# Patient Record
Sex: Male | Born: 1945 | Race: White | Hispanic: No | Smoking: Former smoker
Health system: Southern US, Community
[De-identification: ages and names within clinical notes are randomized; demographics above are authoritative.]

## PROBLEM LIST (undated history)

## (undated) DIAGNOSIS — I779 Disorder of arteries and arterioles, unspecified: Secondary | ICD-10-CM

## (undated) DIAGNOSIS — I10 Essential (primary) hypertension: Secondary | ICD-10-CM

## (undated) DIAGNOSIS — I214 Non-ST elevation (NSTEMI) myocardial infarction: Secondary | ICD-10-CM

## (undated) DIAGNOSIS — J449 Chronic obstructive pulmonary disease, unspecified: Secondary | ICD-10-CM

## (undated) DIAGNOSIS — E785 Hyperlipidemia, unspecified: Secondary | ICD-10-CM

## (undated) DIAGNOSIS — Z9981 Dependence on supplemental oxygen: Secondary | ICD-10-CM

## (undated) DIAGNOSIS — J45909 Unspecified asthma, uncomplicated: Secondary | ICD-10-CM

## (undated) DIAGNOSIS — I2510 Atherosclerotic heart disease of native coronary artery without angina pectoris: Secondary | ICD-10-CM

## (undated) DIAGNOSIS — I739 Peripheral vascular disease, unspecified: Secondary | ICD-10-CM

## (undated) DIAGNOSIS — Z720 Tobacco use: Secondary | ICD-10-CM

## (undated) HISTORY — DX: Non-ST elevation (NSTEMI) myocardial infarction: I21.4

## (undated) HISTORY — DX: Essential (primary) hypertension: I10

## (undated) HISTORY — PX: OTHER SURGICAL HISTORY: SHX169

## (undated) HISTORY — DX: Disorder of arteries and arterioles, unspecified: I77.9

## (undated) HISTORY — DX: Peripheral vascular disease, unspecified: I73.9

## (undated) HISTORY — DX: Chronic obstructive pulmonary disease, unspecified: J44.9

## (undated) HISTORY — PX: ABDOMINAL SURGERY: SHX537

## (undated) HISTORY — PX: COLONOSCOPY: SHX174

## (undated) HISTORY — DX: Tobacco use: Z72.0

## (undated) HISTORY — DX: Hyperlipidemia, unspecified: E78.5

## (undated) HISTORY — DX: Atherosclerotic heart disease of native coronary artery without angina pectoris: I25.10

---

## 2003-07-11 HISTORY — PX: SIGMOID RESECTION / RECTOPEXY: SUR1294

## 2010-09-06 ENCOUNTER — Encounter (INDEPENDENT_AMBULATORY_CARE_PROVIDER_SITE_OTHER): Payer: Medicare HMO | Admitting: Vascular Surgery

## 2010-09-06 DIAGNOSIS — I70219 Atherosclerosis of native arteries of extremities with intermittent claudication, unspecified extremity: Secondary | ICD-10-CM

## 2010-09-07 NOTE — Consult Note (Signed)
NEW PATIENT CONSULTATION  EASTIN, SWING DOB:  1945-09-16                                       09/06/2010 [id number]#:[id number]  The patient is a 65 year old male patient with severe lower extremity occlusive disease referred for rest ischemia and severe claudication symptoms which he has experienced for the past few years.  He states these symptoms have been worsening over the last few months.  He has pain and numbness in his feet at rest to some degree and has severe claudication which prevents him from walking any distance.  This involves his entire legs.  He has no history of infection or cellulitis or non-healing ulcers  CHRONIC MEDICAL PROBLEMS: 1. Hypertension. 2. COPD with chronic bronchitis and emphysema. 3. A history of tobacco abuse. 4. History of small bowel obstruction with bowel resection and hernia     repair. 5. Negative for coronary artery disease, diabetes, hyperlipidemia or     stroke.  SOCIAL HISTORY:  He is single, has 2 children, is retired.  He smoked 1 to 2 packs cigarettes for 50 years.  Does not use alcohol.  Quit 10 years ago.  FAMILY HISTORY:  Negative coronary disease, diabetes and stroke.  REVIEW OF SYSTEMS:  Positive for productive cough, wheezing, joint pain, muscle pain particularly in his hips with walking, dyspnea on exertion. Denies chest pain.  Has a hiatal hernia, constipation.  All other systems are negative in complete review of systems.  PHYSICAL EXAMINATION:  Blood pressure 186/81, heart rate is 96, respirations 20.  General:  He is a well-developed, well-nourished male in no apparent distress, alert and oriented x3.  HEENT:  Normal for age. EOM is intact.  Lungs revealed diffuse rash or wheezing bilaterally with bilateral rhonchi.  Cardiovascular:  Regular rhythm.  No murmurs. Carotid pulses are 3+.  No audible bruits.  Abdomen reveals a longitudinal and transverse incision through the umbilical area with a diffuse  upper abdominal ventral hernia.  No masses are palpable. Musculoskeletal:  Free of major deformities.  Neurologic:  Decreased sensation in both feet.  Skin exam reveals dependent rubor in both legs from the midcalf distally with no evidence of cellulitis.  Lower extremity exam reveals absent femoral and distal pulses.  I reviewed the records provided by Dr. Sherryll Burger, including an ultrasound study performed on February 20 of this year by [facility] revealing an ABI of 0.26 on the right, 0.24 on the left, with multilevel disease.  I think this patient has probably an aortic or bilateral iliac occlusions with superficial femoral and/or tibial disease as well with severe symptoms.  I have ordered the following: 1. A CT angiogram to be performed in the next few days to look at his     aorta and runoff. 2. A Cardiolite to evaluate his cardiac status. 3. Return to see me in 1 week for reconstructive surgery, probable     right axillobifemoral bypass to be done on Wednesday March 7.  If     it appears that he has something amenable to PTA and stenting after     CT angiograms, then we will go that route.    Quita Skye Hart Rochester, M.D. Electronically Signed  JDL/MEDQ  D:  09/06/2010  T:  09/07/2010  Job:  [id number]  cc:   Kirstie Peri, MD

## 2010-09-08 ENCOUNTER — Other Ambulatory Visit: Payer: Self-pay | Admitting: Vascular Surgery

## 2010-09-08 DIAGNOSIS — I739 Peripheral vascular disease, unspecified: Secondary | ICD-10-CM

## 2010-09-08 HISTORY — PX: OTHER SURGICAL HISTORY: SHX169

## 2010-09-13 ENCOUNTER — Ambulatory Visit (INDEPENDENT_AMBULATORY_CARE_PROVIDER_SITE_OTHER): Payer: Medicare HMO | Admitting: Vascular Surgery

## 2010-09-13 ENCOUNTER — Ambulatory Visit
Admission: RE | Admit: 2010-09-13 | Discharge: 2010-09-13 | Disposition: A | Discharge status: Home / Self Care | Payer: Medicare HMO | Source: Ambulatory Visit | Attending: Vascular Surgery | Admitting: Vascular Surgery

## 2010-09-13 DIAGNOSIS — I739 Peripheral vascular disease, unspecified: Secondary | ICD-10-CM

## 2010-09-13 DIAGNOSIS — I70219 Atherosclerosis of native arteries of extremities with intermittent claudication, unspecified extremity: Secondary | ICD-10-CM

## 2010-09-13 MED ORDER — IOHEXOL 350 MG/ML SOLN
165.0000 mL | Freq: Once | INTRAVENOUS | Status: AC | PRN
  Administered 2010-09-13: 165 mL via INTRAVENOUS

## 2010-09-14 ENCOUNTER — Inpatient Hospital Stay (HOSPITAL_COMMUNITY)
Admission: RE | Admit: 2010-09-14 | Discharge: 2010-09-24 | DRG: 252 | Disposition: A | Discharge status: Home / Self Care | Payer: Medicare HMO | Source: Ambulatory Visit | Attending: Vascular Surgery | Admitting: Vascular Surgery

## 2010-09-14 ENCOUNTER — Inpatient Hospital Stay (HOSPITAL_COMMUNITY): Payer: Medicare HMO

## 2010-09-14 DIAGNOSIS — J209 Acute bronchitis, unspecified: Secondary | ICD-10-CM | POA: Diagnosis not present

## 2010-09-14 DIAGNOSIS — F17200 Nicotine dependence, unspecified, uncomplicated: Secondary | ICD-10-CM | POA: Diagnosis present

## 2010-09-14 DIAGNOSIS — E872 Acidosis, unspecified: Secondary | ICD-10-CM | POA: Diagnosis not present

## 2010-09-14 DIAGNOSIS — N179 Acute kidney failure, unspecified: Secondary | ICD-10-CM | POA: Diagnosis not present

## 2010-09-14 DIAGNOSIS — R404 Transient alteration of awareness: Secondary | ICD-10-CM | POA: Diagnosis not present

## 2010-09-14 DIAGNOSIS — J440 Chronic obstructive pulmonary disease with acute lower respiratory infection: Secondary | ICD-10-CM | POA: Diagnosis not present

## 2010-09-14 DIAGNOSIS — I7092 Chronic total occlusion of artery of the extremities: Secondary | ICD-10-CM | POA: Diagnosis present

## 2010-09-14 DIAGNOSIS — K560 Paralytic ileus: Secondary | ICD-10-CM | POA: Diagnosis not present

## 2010-09-14 DIAGNOSIS — I959 Hypotension, unspecified: Secondary | ICD-10-CM | POA: Diagnosis not present

## 2010-09-14 DIAGNOSIS — L02219 Cutaneous abscess of trunk, unspecified: Secondary | ICD-10-CM | POA: Diagnosis not present

## 2010-09-14 DIAGNOSIS — K56609 Unspecified intestinal obstruction, unspecified as to partial versus complete obstruction: Secondary | ICD-10-CM | POA: Diagnosis not present

## 2010-09-14 DIAGNOSIS — J9600 Acute respiratory failure, unspecified whether with hypoxia or hypercapnia: Secondary | ICD-10-CM | POA: Diagnosis not present

## 2010-09-14 DIAGNOSIS — J9819 Other pulmonary collapse: Secondary | ICD-10-CM | POA: Diagnosis not present

## 2010-09-14 DIAGNOSIS — I10 Essential (primary) hypertension: Secondary | ICD-10-CM | POA: Diagnosis present

## 2010-09-14 DIAGNOSIS — I70229 Atherosclerosis of native arteries of extremities with rest pain, unspecified extremity: Principal | ICD-10-CM | POA: Diagnosis present

## 2010-09-14 LAB — URINALYSIS, ROUTINE W REFLEX MICROSCOPIC
Hgb urine dipstick: NEGATIVE
Specific Gravity, Urine: 1.037 — ABNORMAL HIGH (ref 1.005–1.030)
Urobilinogen, UA: 0.2 mg/dL (ref 0.0–1.0)
pH: 6.5 (ref 5.0–8.0)

## 2010-09-14 LAB — COMPREHENSIVE METABOLIC PANEL
BUN: 23 mg/dL (ref 6–23)
CO2: 29 mEq/L (ref 19–32)
Calcium: 8.9 mg/dL (ref 8.4–10.5)
Creatinine, Ser: 1.11 mg/dL (ref 0.4–1.5)
GFR calc Af Amer: >60 mL/min (ref >60)
GFR calc non Af Amer: >60 mL/min (ref >60)
Glucose, Bld: 114 mg/dL — ABNORMAL HIGH (ref 70–99)
Total Bilirubin: 0.5 mg/dL (ref 0.3–1.2)

## 2010-09-14 LAB — CBC
HCT: 41.6 % (ref 39.0–52.0)
Hemoglobin: 14.4 g/dL (ref 13.0–17.0)
RBC: 4.4 MIL/uL (ref 4.22–5.81)
RDW: 15 % (ref 11.5–15.5)
WBC: 15.7 10*3/uL — ABNORMAL HIGH (ref 4.0–10.5)

## 2010-09-14 LAB — SURGICAL PCR SCREEN
MRSA, PCR: NEGATIVE
Staphylococcus aureus: NEGATIVE

## 2010-09-14 LAB — PROTIME-INR: Prothrombin Time: 14.7 seconds (ref 11.6–15.2)

## 2010-09-14 NOTE — Assessment & Plan Note (Signed)
OFFICE VISIT  MARCOS, RUELAS DOB:  [birth year]                                       09/13/2010 EAVWU#:[id number]  Patient returned today, having had a CT angiogram of his abdominal aorta and bilateral runoff performed at [facility] today.  I have reviewed the study via computer.  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  It appears that his superficial femoral arteries are diseased but patent bilaterally, and he does have 3-vessel runoff.  He continues to experience rest pain with numbness in his feet and has a superficial ulcer in the pretibial area of the right leg.  CHRONIC MEDICAL PROBLEMS: 1. Hypertension. 2. COPD with chronic bronchitis and emphysema. 3. History of chronic tobacco abuse. 4. Negative for coronary artery disease, diabetes, hyperlipidemia, or     stroke.  He did have cardiac clearance by Dr. Lynnea Ferrier following a Cardiolite study, which revealed no evidence of ischemia and an ejection fraction of 66%.  PHYSICAL EXAMINATION:  On exam today, his blood pressure is equal in both upper extremities at 142/76, heart rate is 80, respirations 14.  He has 3+ brachial and radial pulses bilaterally with absent femoral pulses and chronically ischemic legs.  Will plan a right axillofemoral and right-to-left femoral-femoral bypass graft to be performed tomorrow at [facility].  The risks and benefits have been thoroughly discussed with the patient and also impressed upon him and emphasized the need to discontinue smoking because of his severe COPD and atherosclerosis.    Quita Skye Hart Rochester, M.D. Electronically Signed  JDL/MEDQ  D:  09/13/2010  T:  09/14/2010  Job:  [id number]

## 2010-09-15 LAB — BASIC METABOLIC PANEL
Calcium: 8.1 mg/dL — ABNORMAL LOW (ref 8.4–10.5)
GFR calc Af Amer: >60 mL/min (ref >60)
GFR calc non Af Amer: >60 mL/min (ref >60)
Glucose, Bld: 138 mg/dL — ABNORMAL HIGH (ref 70–99)
Potassium: 3.2 mEq/L — ABNORMAL LOW (ref 3.5–5.1)
Sodium: 134 mEq/L — ABNORMAL LOW (ref 135–145)

## 2010-09-15 LAB — EXPECTORATED SPUTUM ASSESSMENT W GRAM STAIN, RFLX TO RESP C

## 2010-09-15 LAB — CBC
HCT: 37.2 % — ABNORMAL LOW (ref 39.0–52.0)
Hemoglobin: 12.6 g/dL — ABNORMAL LOW (ref 13.0–17.0)
MCHC: 33.9 g/dL (ref 30.0–36.0)
RDW: 14.6 % (ref 11.5–15.5)
WBC: 18.6 10*3/uL — ABNORMAL HIGH (ref 4.0–10.5)

## 2010-09-16 ENCOUNTER — Inpatient Hospital Stay (HOSPITAL_COMMUNITY): Payer: Medicare HMO

## 2010-09-16 DIAGNOSIS — I739 Peripheral vascular disease, unspecified: Secondary | ICD-10-CM

## 2010-09-16 LAB — CBC
Hemoglobin: 11.6 g/dL — ABNORMAL LOW (ref 13.0–17.0)
MCH: 31.7 pg (ref 26.0–34.0)
MCHC: 34 g/dL (ref 30.0–36.0)
Platelets: 198 10*3/uL (ref 150–400)
RDW: 14.6 % (ref 11.5–15.5)

## 2010-09-16 LAB — BASIC METABOLIC PANEL
Calcium: 7.8 mg/dL — ABNORMAL LOW (ref 8.4–10.5)
Creatinine, Ser: 1.65 mg/dL — ABNORMAL HIGH (ref 0.4–1.5)
GFR calc Af Amer: 51 mL/min — ABNORMAL LOW (ref >60)
GFR calc non Af Amer: 42 mL/min — ABNORMAL LOW (ref >60)
Sodium: 130 mEq/L — ABNORMAL LOW (ref 135–145)

## 2010-09-16 LAB — CREATININE, URINE, RANDOM: Creatinine, Urine: 241.1 mg/dL

## 2010-09-17 ENCOUNTER — Inpatient Hospital Stay (HOSPITAL_COMMUNITY): Payer: Medicare HMO

## 2010-09-17 DIAGNOSIS — J9600 Acute respiratory failure, unspecified whether with hypoxia or hypercapnia: Secondary | ICD-10-CM

## 2010-09-17 DIAGNOSIS — N179 Acute kidney failure, unspecified: Secondary | ICD-10-CM

## 2010-09-17 DIAGNOSIS — R579 Shock, unspecified: Secondary | ICD-10-CM

## 2010-09-17 LAB — BASIC METABOLIC PANEL
BUN: 31 mg/dL — ABNORMAL HIGH (ref 6–23)
CO2: 22 mEq/L (ref 19–32)
Calcium: 8 mg/dL — ABNORMAL LOW (ref 8.4–10.5)
Glucose, Bld: 238 mg/dL — ABNORMAL HIGH (ref 70–99)
Potassium: 4.2 mEq/L (ref 3.5–5.1)
Sodium: 123 mEq/L — ABNORMAL LOW (ref 135–145)

## 2010-09-17 LAB — POCT I-STAT, CHEM 8
Chloride: 100 mEq/L (ref 96–112)
HCT: 34 % — ABNORMAL LOW (ref 39.0–52.0)
Hemoglobin: 11.6 g/dL — ABNORMAL LOW (ref 13.0–17.0)
Potassium: 4.1 mEq/L (ref 3.5–5.1)
Sodium: 127 mEq/L — ABNORMAL LOW (ref 135–145)

## 2010-09-17 LAB — CBC
HCT: 34.2 % — ABNORMAL LOW (ref 39.0–52.0)
HCT: 33 % — ABNORMAL LOW (ref 39.0–52.0)
Hemoglobin: 11.9 g/dL — ABNORMAL LOW (ref 13.0–17.0)
Hemoglobin: 11.6 g/dL — ABNORMAL LOW (ref 13.0–17.0)
MCH: 32.1 pg (ref 26.0–34.0)
MCHC: 34.8 g/dL (ref 30.0–36.0)
MCHC: 35.2 g/dL (ref 30.0–36.0)
MCV: 91.9 fL (ref 78.0–100.0)
RDW: 14.1 % (ref 11.5–15.5)

## 2010-09-17 LAB — CARDIAC PANEL(CRET KIN+CKTOT+MB+TROPI)
CK, MB: 7.1 ng/mL (ref 0.3–4.0)
Relative Index: 1.5 (ref 0.0–2.5)
Total CK: 734 U/L — ABNORMAL HIGH (ref 7–232)
Total CK: 429 U/L — ABNORMAL HIGH (ref 7–232)
Troponin I: 0.03 ng/mL (ref 0.00–0.06)
Troponin I: 0.03 ng/mL (ref 0.00–0.06)

## 2010-09-17 LAB — POCT I-STAT 3, ART BLOOD GAS (G3+)
pCO2 arterial: 37.3 mmHg (ref 35.0–45.0)
pO2, Arterial: 54 mmHg — ABNORMAL LOW (ref 80.0–100.0)

## 2010-09-17 LAB — CULTURE, RESPIRATORY W GRAM STAIN: Gram Stain: NONE SEEN

## 2010-09-17 LAB — DIFFERENTIAL
Basophils Absolute: 0 10*3/uL (ref 0.0–0.1)
Eosinophils Relative: 0 % (ref 0–5)
Lymphocytes Relative: 5 % — ABNORMAL LOW (ref 12–46)
Lymphs Abs: 0.9 10*3/uL (ref 0.7–4.0)
Monocytes Absolute: 1.2 10*3/uL — ABNORMAL HIGH (ref 0.1–1.0)
Monocytes Relative: 7 % (ref 3–12)
Neutro Abs: 15.4 10*3/uL — ABNORMAL HIGH (ref 1.7–7.7)

## 2010-09-18 ENCOUNTER — Inpatient Hospital Stay (HOSPITAL_COMMUNITY): Payer: Medicare HMO

## 2010-09-18 DIAGNOSIS — K560 Paralytic ileus: Secondary | ICD-10-CM

## 2010-09-18 DIAGNOSIS — J441 Chronic obstructive pulmonary disease with (acute) exacerbation: Secondary | ICD-10-CM

## 2010-09-18 DIAGNOSIS — I959 Hypotension, unspecified: Secondary | ICD-10-CM

## 2010-09-18 DIAGNOSIS — N179 Acute kidney failure, unspecified: Secondary | ICD-10-CM

## 2010-09-18 LAB — TYPE AND SCREEN
ABO/RH(D): A POS
Antibody Screen: POSITIVE
DAT, IgG: NEGATIVE

## 2010-09-18 LAB — CBC
MCH: 32.8 pg (ref 26.0–34.0)
MCV: 93.3 fL (ref 78.0–100.0)
Platelets: 261 10*3/uL (ref 150–400)
RDW: 14.5 % (ref 11.5–15.5)

## 2010-09-18 LAB — BASIC METABOLIC PANEL
BUN: 25 mg/dL — ABNORMAL HIGH (ref 6–23)
Calcium: 8.4 mg/dL (ref 8.4–10.5)
Creatinine, Ser: 1.03 mg/dL (ref 0.4–1.5)
GFR calc Af Amer: >60 mL/min (ref >60)
GFR calc non Af Amer: >60 mL/min (ref >60)

## 2010-09-18 LAB — CLOSTRIDIUM DIFFICILE BY PCR: Toxigenic C. Difficile by PCR: NEGATIVE

## 2010-09-18 LAB — GLUCOSE, CAPILLARY: Glucose-Capillary: 169 mg/dL — ABNORMAL HIGH (ref 70–99)

## 2010-09-19 ENCOUNTER — Inpatient Hospital Stay (HOSPITAL_COMMUNITY): Payer: Medicare HMO

## 2010-09-19 DIAGNOSIS — R4182 Altered mental status, unspecified: Secondary | ICD-10-CM

## 2010-09-19 LAB — BASIC METABOLIC PANEL
Calcium: 8.1 mg/dL — ABNORMAL LOW (ref 8.4–10.5)
Calcium: 7.9 mg/dL — ABNORMAL LOW (ref 8.4–10.5)
GFR calc Af Amer: >60 mL/min (ref >60)
GFR calc non Af Amer: >60 mL/min (ref >60)
GFR calc non Af Amer: >60 mL/min (ref >60)
Glucose, Bld: 106 mg/dL — ABNORMAL HIGH (ref 70–99)
Glucose, Bld: 92 mg/dL (ref 70–99)
Sodium: 143 mEq/L (ref 135–145)
Sodium: 145 mEq/L (ref 135–145)

## 2010-09-19 LAB — CBC
HCT: 36.4 % — ABNORMAL LOW (ref 39.0–52.0)
MCHC: 34.9 g/dL (ref 30.0–36.0)
Platelets: 355 10*3/uL (ref 150–400)
RDW: 14.8 % (ref 11.5–15.5)

## 2010-09-19 LAB — MAGNESIUM: Magnesium: 2.6 mg/dL — ABNORMAL HIGH (ref 1.5–2.5)

## 2010-09-20 ENCOUNTER — Inpatient Hospital Stay (HOSPITAL_COMMUNITY): Payer: Medicare HMO

## 2010-09-20 LAB — BASIC METABOLIC PANEL
CO2: 23 mEq/L (ref 19–32)
Calcium: 8.1 mg/dL — ABNORMAL LOW (ref 8.4–10.5)
GFR calc Af Amer: >60 mL/min (ref >60)
GFR calc non Af Amer: >60 mL/min (ref >60)
Sodium: 145 mEq/L (ref 135–145)

## 2010-09-20 LAB — CBC
Hemoglobin: 11.2 g/dL — ABNORMAL LOW (ref 13.0–17.0)
MCHC: 33.5 g/dL (ref 30.0–36.0)
RDW: 14.5 % (ref 11.5–15.5)

## 2010-09-21 ENCOUNTER — Inpatient Hospital Stay (HOSPITAL_COMMUNITY): Payer: Medicare HMO

## 2010-09-21 LAB — BASIC METABOLIC PANEL
CO2: 28 mEq/L (ref 19–32)
CO2: 30 mEq/L (ref 19–32)
Chloride: 109 mEq/L (ref 96–112)
GFR calc Af Amer: >60 mL/min (ref >60)
GFR calc non Af Amer: >60 mL/min (ref >60)
Glucose, Bld: 135 mg/dL — ABNORMAL HIGH (ref 70–99)
Potassium: 2.8 mEq/L — ABNORMAL LOW (ref 3.5–5.1)
Potassium: 3.4 mEq/L — ABNORMAL LOW (ref 3.5–5.1)
Sodium: 145 mEq/L (ref 135–145)
Sodium: 142 mEq/L (ref 135–145)

## 2010-09-21 LAB — CBC
Hemoglobin: 11.4 g/dL — ABNORMAL LOW (ref 13.0–17.0)
RBC: 3.68 MIL/uL — ABNORMAL LOW (ref 4.22–5.81)

## 2010-09-22 LAB — COMPREHENSIVE METABOLIC PANEL
AST: 39 U/L — ABNORMAL HIGH (ref 0–37)
Albumin: 2.5 g/dL — ABNORMAL LOW (ref 3.5–5.2)
BUN: 7 mg/dL (ref 6–23)
Chloride: 106 mEq/L (ref 96–112)
Creatinine, Ser: 0.58 mg/dL (ref 0.4–1.5)
GFR calc Af Amer: >60 mL/min (ref >60)
Potassium: 3.4 mEq/L — ABNORMAL LOW (ref 3.5–5.1)
Total Bilirubin: 0.7 mg/dL (ref 0.3–1.2)
Total Protein: 5.2 g/dL — ABNORMAL LOW (ref 6.0–8.3)

## 2010-09-22 LAB — GLUCOSE, CAPILLARY
Glucose-Capillary: 147 mg/dL — ABNORMAL HIGH (ref 70–99)
Glucose-Capillary: 108 mg/dL — ABNORMAL HIGH (ref 70–99)

## 2010-09-22 LAB — CHOLESTEROL, TOTAL: Cholesterol: 84 mg/dL (ref 0–200)

## 2010-09-22 LAB — DIFFERENTIAL
Basophils Absolute: 0.1 10*3/uL (ref 0.0–0.1)
Basophils Relative: 0 % (ref 0–1)
Eosinophils Absolute: 0.2 10*3/uL (ref 0.0–0.7)
Neutro Abs: 8.2 10*3/uL — ABNORMAL HIGH (ref 1.7–7.7)
Neutrophils Relative %: 72 % (ref 43–77)

## 2010-09-22 LAB — CBC
Hemoglobin: 11.8 g/dL — ABNORMAL LOW (ref 13.0–17.0)
MCHC: 34.5 g/dL (ref 30.0–36.0)
Platelets: 267 10*3/uL (ref 150–400)
RBC: 3.64 MIL/uL — ABNORMAL LOW (ref 4.22–5.81)

## 2010-09-22 LAB — PREALBUMIN: Prealbumin: 7 mg/dL — ABNORMAL LOW (ref 17.0–34.0)

## 2010-09-22 LAB — PHOSPHORUS: Phosphorus: 3.7 mg/dL (ref 2.3–4.6)

## 2010-09-22 LAB — AMYLASE: Amylase: 18 U/L (ref 0–105)

## 2010-09-23 LAB — BASIC METABOLIC PANEL
BUN: 9 mg/dL (ref 6–23)
CO2: 27 mEq/L (ref 19–32)
Calcium: 8.3 mg/dL — ABNORMAL LOW (ref 8.4–10.5)
Chloride: 104 mEq/L (ref 96–112)
Creatinine, Ser: 0.57 mg/dL (ref 0.4–1.5)
GFR calc Af Amer: >60 mL/min (ref >60)
Glucose, Bld: 115 mg/dL — ABNORMAL HIGH (ref 70–99)

## 2010-09-23 LAB — GLUCOSE, CAPILLARY
Glucose-Capillary: 121 mg/dL — ABNORMAL HIGH (ref 70–99)
Glucose-Capillary: 122 mg/dL — ABNORMAL HIGH (ref 70–99)
Glucose-Capillary: 130 mg/dL — ABNORMAL HIGH (ref 70–99)
Glucose-Capillary: 126 mg/dL — ABNORMAL HIGH (ref 70–99)

## 2010-09-24 LAB — BASIC METABOLIC PANEL
BUN: 10 mg/dL (ref 6–23)
CO2: 25 mEq/L (ref 19–32)
Chloride: 101 mEq/L (ref 96–112)
GFR calc non Af Amer: >60 mL/min (ref >60)
Glucose, Bld: 112 mg/dL — ABNORMAL HIGH (ref 70–99)
Potassium: 4 mEq/L (ref 3.5–5.1)
Sodium: 135 mEq/L (ref 135–145)

## 2010-09-24 LAB — GLUCOSE, CAPILLARY

## 2010-09-26 NOTE — Consult Note (Signed)
NAMEONNIE, HATCHEL                ACCOUNT NO.:  [id number]  MEDICAL RECORD NO.:  [id number]           PATIENT TYPE:  I  LOCATION:  2303                         FACILITY:  [facility]  PHYSICIAN:  Maree Krabbe, M.D.DATE OF BIRTH:  Mar 24, 1946  DATE OF CONSULTATION:  09/17/2010 DATE OF DISCHARGE:                                CONSULTATION   REASON FOR CONSULT:  Acute renal failure.  HISTORY:  The patient is a 65 year old white male with hypertension, COPD and long-term tobacco use who was admitted by the Vascular Service on March 7 with severe claudication and bilateral lower extremity rest pain.  The patient was worked up with outpatient CT angiogram of the aortoiliac vessels and lower extremity vessels which was done on March 6 as an outpatient.  He received 165 mL of dye for this procedure.  He was seen in the office that afternoon and admitted the morning of March 7 and underwent right axillary femoral bypass with the right-to-left femoral- femoral bypass graft surgery.  The patient developed acute renal failure over the last 36 hours and we were asked to see the patient for this reason.  The patient received dye on March 6.  His creatinine on the 7th was 1.1 and on the 8th was 0.8.  He underwent surgery on the 7th. Postoperatively, his blood pressure was stable for about 24 hours.  He then received his Norvasc and lisinopril at full dose and blood pressure dropped in the 80s and 90s from the period of early in the morning on March 8 all the way till this morning when the blood pressure medicines were stopped and blood pressure has improved back up in the 120-140 range. During this period, the urine output has been very poor and the creatinine has risen from 0.8 on the 8th to 1.65 yesterday and 2.10 today.  Urine output this morning has improved up to 150 mL an hour  starting at about 8 o'clock this morning.  The patient was also ordered to receive Toradol but did not get any  doses.    Currently, the patient is awake and alert.  He complains of some mild postoperative pain but is in no distress.  He had some postoperative difficulties with his breathing, was put on nebulizers and that has improved.  He has chronic bronchitis type of emphysema picture. Continues to smoke.  He denies any history of renal disease, kidney stones, prostate problems.  He does have a Foley catheter in place.  PAST MEDICAL HISTORY:  Hypertension, long-term tobacco use, severe claudication and COPD.  PAST SURGICAL HISTORY:  Noncontributory.  SOCIAL HISTORY:  Single, has 2 grown children, retired, smokes one to two packs a day for 50 years, quit drinking alcohol 10 years ago.  HOME MEDICATIONS: 1. Norvasc 10 a day. 2. Lisinopril 20 daily. 3. Neurontin. 4. Simvastatin. 5. Cilostazol. 6. Metoprolol 25 b.i.d. 7. Albuterol nebs. 8. Spiriva. 9. Advair Diskus  ALLERGIES:  None.  REVIEW OF SYSTEMS:  CONSTITUTIONAL:  Denies any active fever, chills, sweats, headache, visual change, sore throat, difficulty swallowing, chest pain, shortness of breath, nausea, vomiting, abdominal pain or  diarrhea.  GU:  As above.  MUSCULOSKELETAL:  No joint pain or swelling. Denies any ankle swelling.  HEMATOGIC:  No history of any blood clot DVT or blood transfusion.  NEUROLOGIC:  No history of any stroke, TIA, seizure, focal numbness or weakness.  PHYSICAL EXAMINATION:  VITAL SIGNS:  Currently blood pressure is up to 123/50, it had been in the 80s to 90s all day yesterday and most of the day before. SKIN:  Warm and dry without rash, cyanosis or edema. HEENT:  PERRLA, EOMI.  Throat is clear. NECK:  Supple.  JVP is about 12 cm. CHEST:  Clear throughout.  No rales, rhonchi, or wheezing. CARDIAC:  Tachycardic, regular rate and rhythm without murmur, rub or gallop. ABDOMEN:  Soft, nontender, nondistended.  Active bowel sounds.  No ascites. RECTAL:  Deferred. GU:  Has condom catheters in  place. EXTREMITIES:  No peripheral edema.  There is some mild erythema of the right foot and pulses are diminished in the right foot, palpable in the left foot.  There are large surgical wounds which are healing well in both groins and in the right upper chest.  LABORATORY FINDINGS:  Sodium 127, potassium 4.1, BUN 32, creatinine 2.1 today.  White blood count 21,000, platelets 218,000, hemoglobin 11.6. Portal chest x-ray today, mild interstitial edema.  Chest x-ray from the 7th and the 9th showed no acute disease.  Urinalysis was negative. Urine sodium 263.  IMPRESSION: 1. Acute renal failure due to low blood pressure and renal     hypoperfusion.  Postoperative hypotension did not start immediately     postoperatively but within 24 hours when the patient's blood     pressure medications were started which included an ACE inhibitor,     blood pressure dropped and his urine output was poor for about 48     hours.  This is not dye injury since that what have occurred on the     7th and/or the 8th and the creatinine was stable after receiving     dye.  Marked improvement in urine output today with holding blood     pressure, meds and improvement in systolic pressures.  Expect rapid     improvement as this does not appear to be a full-blown acute     tubular necrosis at this point.  Recommend continuing to hold the     patient's blood pressure medications.  Keep off of the ACE     inhibitor indefinitely until the patient's creatinine is back to     normal.  Okay to use a beta blocker or calcium channel blocker, but     would not lower blood pressure less than 110 systolic intentionally. 2. Hypertension, as above. 3. Peripheral vascular disease status post bypass graft surgery to the     right lower extremity. 4. Heavy tobacco use.  RECOMMENDATIONS:  See orders.  Thank you for the referral, Dr. Hart Rochester.  Feel free to call with any questions.     Maree Krabbe,  M.D.     RDS/MEDQ  D:  09/17/2010  T:  09/18/2010  Job:  [id number]  Electronically Signed by Delano Metz M.D. on 09/26/2010 09:12:05 AM

## 2010-10-03 NOTE — Consult Note (Signed)
[name], [name]                ACCOUNT NO.:  [id number]  MEDICAL RECORD NO.:  [id number]           PATIENT TYPE:  I  LOCATION:  2303                         FACILITY:  [facility]  PHYSICIAN:  Sandria Bales. Ezzard Standing, M.D.  DATE OF BIRTH:  [birth year]  DATE OF CONSULTATION:  09/19/2010                                CONSULTATION   REFERRING PHYSICIAN:  Quita Skye. Hart Rochester, MD  CONSULTING SURGEON:  Sandria Bales. Ezzard Standing, MD  REASON FOR CONSULTATION:  Partial small bowel obstruction.  HISTORY OF PRESENT ILLNESS:  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  He has had some nonhealing wounds of his lower extremities and this required vascular intervention. He has a prior history of abdominal surgery including an apparent bowel resection and has not had a known ventral hernia for some time. However, the details of his prior abdominal surgery are not present as I do not have any prior operative notes.  The patient has had some progressive mental status changes and confusion in the past several days and therefore his history is not necessarily accurate today.    The patient did just have a right axillobifemoral bypass by Dr. Hart Rochester.  Since then, the patient has had diminished bowel function and abdominal distention with nausea and vomiting.  He has had an NG tube placed to low intermittent wall suction.  He, however, also had increased confusion and mental status changes over the past 3 days. However, the patient did seem able to answer my questions somewhat appropriately today, did appear agitated, but certainly not combative. He was seen in restraints with an NG tube in place and has significantly high volume output what appears to be bilious in nature.  Apparently, his x-rays have been consistent with obstruction/ileus.  Therefore, surgical consultation is warranted given that the patient has  not had any significant progress with regards to bowel function.  PAST MEDICAL HISTORY:  Significant again for hypertension, longtime tobacco abuse, atherosclerotic disease, COPD.  PAST SURGICAL HISTORY:  Again, probable past surgical history of partial small bowel resection and hernia repair.  It appears that he has also probably had an appendectomy based on his surgical scars.  FAMILY HISTORY:  Noncontributory to the present case.  SOCIAL HISTORY:  The patient is single, does have 2 children.  He smokes 1-2 packs of cigarettes a day for the past 50 years.  He states he was a prior alcohol abuser but quit approximately 10 years ago.  CHRONIC MEDICATIONS:  Include Norvasc, lisinopril, Neurontin, simvastatin, Pletal, metoprolol, Spiriva, Advair Diskus, and albuterol nebulizers.  ALLERGIES:  No known drug or latex allergies.  REVIEW OF SYSTEMS:  The patient denies any fever, chills, sweats, weight loss.  Denies any prior nausea, vomiting, hematemesis.  He denies any prior diarrhea or constipation.  Denies any dysuria, hematuria.  PHYSICAL EXAMINATION:  GENERAL:  Reveals a 65 year old gentleman who is in restraints but does not appear in active distress.  Certainly nontoxic appearing. VITAL SIGNS:  His current vitals show temperature of 98.1, heart rate of 98,  blood pressure 118/57, respiratory rate of 23, oxygen saturation 97% on room air. ENT:  Nasogastric tube present with bilious output, otherwise negative. NECK:  Supple without lymphadenopathy. LUNGS:  Clear.  No wheezes, rhonchi, or rales.  Normal respiratory effort without use of accessory muscles. HEART:  Regular rate and rhythm without gallops or rubs.  Peripheral pulses deferred due to recent bypass. ABDOMEN:  Soft.  There is minimal distention with no exudates, otherwise flat.  Multiple surgical scars through the midline as well as the right lower quadrant within the midline scar is too palpable fascial  defects, consistent with ventral hernia, however, there is no protrusion of any bowel loops.  Certainly, these are nontender and very soft.  The patient has quiet bowel sounds, is nontender with the examination throughout his abdomen.  Palpable bypass graft noted on the right flank. RECTAL:  Deferred. GENITOURINARY:  Deferred. EXTREMITIES:  Deferred due to recent surgery.  DIAGNOSTICS:  Most recent CBC shows white blood cell count of 9.0, hemoglobin of 12.7, hematocrit of 36.4, platelet count of 355. Metabolic panel shows a sodium of 143, potassium of 5.6, chloride of 106, CO2 of 26, BUN of 17, creatinine of 0.79, glucose of 106, magnesium of 2.6.  IMAGING:  Serial x-rays have been performed most recently, showing persistent evidence of small bowel dilatation with a nonspecific pattern consistent with ileus or partial bowel obstruction.  IMPRESSION: 1. Partial bowel obstruction vs. ileus, possibly secondary to adhesions     versus ileus from recent surgery. 2. Multiple medical problems as noted. 3. Recent axillobifemoral bypass.  RECOMMENDATIONS:  At this point, I would recommend NG tube and n.p.o. status.  It appears that the patient has had some mild bowel functions within the past 12 hours.   However, the patient's abdomen continues to remain flat and nontender.  If the patient is not improved in the next 24 hours, we would proceed with oral contrast CT scan to give [place] a better idea what is going on intraabdominally.  Otherwise, if cleared with Vascular Surgery, and if the patient's mental status and confusion improves, we would otherwise encourage activity such as out of bed walking in the halls as tolerated to improve bowel function.  We will follow along with you and appreciate the opportunity of participating in this patient's care.   Brayton El, PA-C  Sandria Bales. Ezzard Standing, M.D., FACS  KB/MEDQ  D:  09/19/2010  T:  09/20/2010  Job:  [id number]  Electronically Signed by  Brayton El  on 09/20/2010 11:03:36 AM Electronically Signed by Ovidio Kin M.D. on 10/03/2010 10:53:09 AM

## 2010-10-04 ENCOUNTER — Ambulatory Visit (INDEPENDENT_AMBULATORY_CARE_PROVIDER_SITE_OTHER): Payer: Medicare HMO | Admitting: Vascular Surgery

## 2010-10-04 ENCOUNTER — Encounter (INDEPENDENT_AMBULATORY_CARE_PROVIDER_SITE_OTHER): Payer: Medicare HMO

## 2010-10-04 DIAGNOSIS — Z48812 Encounter for surgical aftercare following surgery on the circulatory system: Secondary | ICD-10-CM

## 2010-10-04 DIAGNOSIS — L98499 Non-pressure chronic ulcer of skin of other sites with unspecified severity: Secondary | ICD-10-CM

## 2010-10-04 DIAGNOSIS — I7092 Chronic total occlusion of artery of the extremities: Secondary | ICD-10-CM

## 2010-10-04 NOTE — Op Note (Addendum)
NAMEDESTON, BILYEU                ACCOUNT NO.:  [id number]  MEDICAL RECORD NO.:  [id number]          PATIENT TYPE:  LOCATION:                                 FACILITY:  PHYSICIAN:  Quita Skye. Hart Rochester, M.D.  DATE OF BIRTH:  02-18-1946  DATE OF PROCEDURE:  09/14/2010 DATE OF DISCHARGE:                              OPERATIVE REPORT   PREOPERATIVE DIAGNOSES:  Bilateral external iliac occlusion with diffuse femoral, popliteal and tibial occlusive disease and rest ischemia of both legs and nonhealing ulcer of right leg.  POSTOPERATIVE DIAGNOSES:  Bilateral external iliac occlusion with diffuse femoral, popliteal and tibial occlusive disease and rest ischemia of both legs and nonhealing ulcer of right leg.  OPERATIONS: 1. Right axillofemoral bypass using 8-mm Hemashield Dacron graft. 2. Right-to-left femoral-femoral bypass using an 8-mm Hemashield     Dacron graft.  SURGEON:  Quita Skye. Hart Rochester, MD  FIRST ASSISTANT:  Janetta Hora. Fields, MDSECOND ASSISTANT:  Pecola Leisure, PA  ANESTHESIA:  General endotracheal.  PROCEDURE:  The patient was taken to the operating room, placed in the supine position, at which time, satisfactory general endotracheal anesthesia was administered.  The right upper chest area, both groins and proximal thighs were prepped with Betadine scrub solution and draped in a routine sterile manner.  Short longitudinal incisions were made in both inguinal areas.  The common superficial and profunda femoris arteries were dissected free, encircled with vessel loops.  Both vessels were pulseless, but there was diffuse posterior calcific plaquing in both arteries not only in the common femoral, but also in the superficial femoral and profunda.  Short infraclavicular incision was made on the right side, carried down through the subcutaneous tissue and dissection continued between the fibers of the pectoralis major muscle. Pectoralis minor muscle was identified, carefully  divided with the Bovie.  The axillary artery was then exposed and care was taken not to injure the brachial plexus, which was more anteriorly located than normal.  The artery was dissected free more proximally.  The thyrocervical trunk was ligated with 2-0 silk tie and divided to give adequate proximal exposure for the anastomosis.  A tunnel was then created deep to the pectoralis minor muscle down the mid axillary line on the right connecting the inguinal and infraclavicular wounds and a suprapubic tunnel was created between the right and left inguinal wound since 8 mm Hemashield Dacron graft delivered through both tunnels and the patient was heparinized.  Axillary artery was occluded proximally and distally with clamps, opened with 15-blade and extended with Potts scissors, had excellent flow.  The 8-mm graft spatulated, anastomosed end-to-side with 6-0 Prolene.  Clamps were then released.  There was excellent distal pulse in the brachial artery.  Attention turned to the inguinal areas, were longitudinal incision, was made in the right side through the common femoral artery extended with 15-blade down with Potts scissors to the origin of the superficial femoral.  It was a patent vessel, but was diffusely diseased, particularly posteriorly.  End-to- side anastomosis was done between the right axillofemoral graft to the femoral artery with 5-0 Prolene.  Following completion of this, attention was  turned to the left groin where a longitudinal opening made in the common femoral artery on the left in a similar fashion.  The femoral-femoral graft was spatulated and anastomosed end-to-side with 5- 0 Prolene to the left femoral artery.  Superficial femoral and the profunda were opened on the left as well, but diffusely diseased and as the last step, the right-to-left femoral-femoral graft was anastomosed to the distal end of the right axillofemoral graft after selecting a site opening with  11-blade, enlarging with 5-mm punch.  This was done with 6-0 Prolene, clamps released.  There was excellent Doppler flow in both legs in the superficial femoral and profunda femoris arteries. Adequate hemostasis was achieved following protamine.  The sound was closed in layers with Vicryl in subcuticular fashion with Dermabond. The patient was taken to the recovery room in stable condition.     Quita Skye Hart Rochester, M.D.     JDL/MEDQ  D:  09/14/2010  T:  09/15/2010  Job:  [id number]  Electronically Signed by Josephina Gip M.D. on 10/04/2010 03:26:18 PM

## 2010-10-04 NOTE — Discharge Summary (Signed)
[name], [name]                ACCOUNT NO.:  [id number]  MEDICAL RECORD NO.:  [id number]           PATIENT TYPE:  I  LOCATION:  2009                         FACILITY:  [facility]  PHYSICIAN:  Quita Skye. Hart Rochester, M.D.  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  PAST MEDICAL HISTORY AND DISCHARGE DIAGNOSES: 1. Hypertension. 2. 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 3. Edema. 4. Chronic tobacco abuse. 5. 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 6. Postoperative cellulitis, resolved. 7. Postoperative acute renal failure secondary to hypotension with renal hypoperfusion,     resolved. 8. Postoperative chronic obstructive pulmonary disease exacerbation,     resolved. 9. Confusion secondary to ICU delirium, resolved. 10.Partial small-bowel obstruction versus ileus, resolved.  ALLERGIES:  No known drug allergies.  BRIEF HISTORY:  The patient is a 65 year old Caucasian male who presented to Dr. Hart Rochester for evaluation of severe lower extremity claudication and rest pain.  The patient stated he has had this issue for several years, but it had progressively gotten worse over the previous few months.  Secondary to his complaints, Dr. Hart Rochester ordered a CT angiogram, which was performed and revealed total occlusion of the bilateral distal iliac arteries with reconstitution of the common femorals, which were also diseased.  The SFA arteries were patent bilaterally and he had three-vessel runoff bilaterally.  Secondary to these findings, Dr. Hart Rochester obtained cardiac clearance by Dr. Lynnea Ferrier in order to proceed with right ax-fem and  fem-fem bypass surgeries.  HOSPITAL COURSE:  The patient was admitted and taken to the OR on September 14, 2010, for right axillofemoral and right-to-left femoral-to-femoral bypass graft with Hemashield.  The patient tolerated the procedure well and was hemodynamically stable immediately postoperatively.  The patient was transferred from the OR to the Post Anesthesia Care Unit in a stable condition.  The patient was extubated without complication and woke up from anesthesia neurologically intact.  The patient was transferred to the floor without difficulty.  The patient's postoperative course has been complicated by several issues. 1. COPD exacerbation with acute on chronic bronchitis.  The patient     was noted on postoperative day #1 to be coughing up copious     secretions and chest x-ray had revealed chronic bronchitis.  The     patient has known COPD.  He was started on Avelox and sputum was     sent for culture and sensitivity.  He was also started on Mucomyst     and continued on his home pulmonary medications.  The patient did     not develop frank pneumonia.  He did not require reintubation.     With the respiratory therapy, antibiotic therapy, and incentive     spirometer, the patient's pulmonary status improved and he is     currently doing well  from this standpoint. 2. Acute renal failure secondary to hypotension and renal     hypoperfusion.  Also early in the postoperative course, the     patient was noted to have an increased creatinine and hypotension.     He had received his blood pressure medications early in the     postoperative course and these were subsequently discontinued.  As     his blood pressure improved, his urine output improved and his     creatinine returned to normal.  A renal consult was     obtained and they followed the patient until such time that his     creatinine was back to normal.  The patient has not been restarted     on the ACE inhibitor that he  was on prior to admission and this will     continued to be held until reevaluation by his primary care     provider as an outpatient. 3. Groin wound cellulitis.  The patient was noted to have some     erythema around the groin incision.  His white count bumped     slightly in the early postoperative course as well.  This returned     to a normal value.  He was also started on Ancef in addition to the     Avelox for the cellulitis.  The groin wounds have     continued to improved and are currently well healed with no     evidence of infection. 4. On postoperative day #3, the patient developed emesis and diarrhea.     The abdomen was soft, but distended.  An NG tube was placed     secondary to the patient's persistent emesis and his history of a     previous partial small bowel obstruction.  General Surgery was     consulted as the patient's NG output continued to remain     elevated for several days.  X-rays were not definitive, although     they did reveal dilated small loops of bowel.  The NG tube was kept     in place for several days and as the output decreased and the     patient has had bowel movements, it was discontinued successfully.     The patient's diet has slowly been restarted.  He was on TPN     throughout the time that he has had the NG tube in place and prior     to resuming a regular diet.  This was accomplished via a PICC line.     Currently, the patient is tolerating a regular diet without any     difficulty.  He is having bowel movements. 5. Confusion with ICU deliriu:  After the patient developed     persistent hypotension, acute renal failure, and partial small-     bowel obstruction versus ileus, he was transferred to the SICU     unit on postoperative day #4.  During that time, he developed ICU     delirium and confusion.  This was monitored and regulated by the     Pulmonary Critical Care Service with medications.  This     subsequently resolved and he is  currently doing well with no signs     of confusion or agitation and on no medications.  On September 23, 2010, the patient was afebrile with stable vital signs. His grafts was patent and the wounds are clean, dry, and intact.  The abdomen is  slightly distended with active bowel sounds.  He is passing flatus and having bowel movements. He currently feels well and all of his acute issues have resolved.  As long as the patient continues to progress in that manner, he should be ready for discharge home within the next 1-2 days pending reevaluation.  LABORATORY DATA:  CBC on September 22, 2010, white count 11.4, hemoglobin 11.8, hematocrit 34.2, platelets 267.  BMP on September 23, 2010, sodium 136, potassium 3.4, BUN 9, creatinine 0.57.  Renal ultrasound performed on September 21, 2010, revealed thick kidneys were sonographically normal and his bladder was unremarkable.  DISCHARGE INSTRUCTIONS:  The patient received specific written discharge instructions regarding diet, activity, and wound care.  He will follow up with Dr. Hart Rochester approximately 2 weeks after discharge.  The patient is informed that he will need to contact his primary care provider to inform him of his hospital stay and subsequent complications.  He will also need to discuss resumption of the blood pressure medications, which are currently being held and include amlodipine and lisinopril.  DISCHARGE MEDICATIONS: 1. Dulcolax 5 mg tab 1-2 daily p.r.n. constipation. 2. Percocet 5/325 mg one to two q.4-6 h. p.r.n. pain. 3. Advair Diskus 250/50 one puff inhaled daily p.r.n. 4. Albuterol neb one nebulization inhaled daily. 5. __________ 100 mg p.o. b.i.d. 6. Metoprolol 25 mg one p.o. b.i.d. 7. Neurontin 300 mg one t.i.d. 8. Simvastatin 40 mg daily. 9. Spiriva 18 mcg 1 capsule inhaled daily.  Medications that have been stopped: 1. Amlodipine 10 mg daily. 2. Lisinopril 20 mg daily.     Pecola Leisure,  PA   ______________________________ Quita Skye Hart Rochester, M.D.    AY/MEDQ  D:  09/23/2010  T:  09/24/2010  Job:  [id number]  Electronically Signed by Pecola Leisure PA on 09/27/2010 10:34:54 AM Electronically Signed by Josephina Gip M.D. on 10/04/2010 03:25:37 PM

## 2010-10-04 NOTE — Assessment & Plan Note (Signed)
OFFICE VISIT  TRAMELL, PIECHOTA DOB:  [birth year]                                       10/04/2010 EAVWU#:[id number]  The patient underwent right axillobifemoral bypass graft by me on March 7 for severe ischemia of both lower extremities with early ischemic ulcer in the right pretibial area.  He has had an excellent early result with complete resolution of his claudication symptoms and healing of the lesion in his right leg.  He states that he is able to walk as long a distance as he would like, not bothered by claudication or rest pain or numbness.  He has not smoked a cigarette since he was admitted to the hospital and I reassured him once again reemphasized the importance of tobacco cessation in his treatment.  He has had no chest pain and his GI tract has remained open with no nausea, vomiting or abdominal pain.  He did have some mild distention postoperatively requiring an NG tube for a period of time for some possible partial small bowel obstruction which he has had in the past.  PHYSICAL EXAMINATION:  Vital signs:  Blood pressure 156/84, heart rate 71, respirations 14, temperature 97.8.  Abdomen:  Soft, nontender with no masses.  He has a nice healing of his 3 incisions and there was an excellent axillofemoral and a femoral-femoral pulse with 2+ posterior tibial pulses bilaterally.  I reassured him regarding these findings and will follow him on a regular basis in the vascular lab for patency of his graft.    Quita Skye Hart Rochester, M.D. Electronically Signed  JDL/MEDQ  D:  10/04/2010  T:  10/04/2010  Job:  [id number]

## 2011-05-29 DIAGNOSIS — R079 Chest pain, unspecified: Secondary | ICD-10-CM

## 2011-07-11 DIAGNOSIS — I214 Non-ST elevation (NSTEMI) myocardial infarction: Secondary | ICD-10-CM

## 2011-07-11 DIAGNOSIS — R7989 Other specified abnormal findings of blood chemistry: Secondary | ICD-10-CM

## 2011-07-11 DIAGNOSIS — J449 Chronic obstructive pulmonary disease, unspecified: Secondary | ICD-10-CM

## 2011-07-12 DIAGNOSIS — I214 Non-ST elevation (NSTEMI) myocardial infarction: Secondary | ICD-10-CM

## 2011-07-12 DIAGNOSIS — R072 Precordial pain: Secondary | ICD-10-CM

## 2011-07-25 ENCOUNTER — Encounter: Payer: Self-pay | Admitting: *Deleted

## 2011-07-28 ENCOUNTER — Ambulatory Visit (INDEPENDENT_AMBULATORY_CARE_PROVIDER_SITE_OTHER): Payer: Medicare HMO | Admitting: Cardiology

## 2011-07-28 ENCOUNTER — Encounter: Payer: Self-pay | Admitting: Cardiology

## 2011-07-28 VITALS — BP 148/76 | HR 72 | Ht 65.0 in | Wt 157.0 lb

## 2011-07-28 DIAGNOSIS — I2510 Atherosclerotic heart disease of native coronary artery without angina pectoris: Secondary | ICD-10-CM

## 2011-07-28 DIAGNOSIS — I779 Disorder of arteries and arterioles, unspecified: Secondary | ICD-10-CM

## 2011-07-28 DIAGNOSIS — E782 Mixed hyperlipidemia: Secondary | ICD-10-CM | POA: Insufficient documentation

## 2011-07-28 DIAGNOSIS — I10 Essential (primary) hypertension: Secondary | ICD-10-CM | POA: Insufficient documentation

## 2011-07-28 DIAGNOSIS — Z79899 Other long term (current) drug therapy: Secondary | ICD-10-CM

## 2011-07-28 DIAGNOSIS — I739 Peripheral vascular disease, unspecified: Secondary | ICD-10-CM | POA: Insufficient documentation

## 2011-07-28 MED ORDER — NITROGLYCERIN 0.4 MG SL SUBL: 0.4000 mg | SUBLINGUAL_TABLET | SUBLINGUAL | Status: DC | PRN

## 2011-07-28 MED ORDER — SIMVASTATIN 10 MG PO TABS: 10.0000 mg | ORAL_TABLET | Freq: Every evening | ORAL | Status: DC

## 2011-07-28 NOTE — Assessment & Plan Note (Signed)
No change to the current regimen.

## 2011-07-28 NOTE — Assessment & Plan Note (Addendum)
Plan at this point is to continue medical therapy and observation. He had evidence of inferior scar by Myoview done recently, no active ischemia, LVEF is 45-50% by echocardiogram. He reports no active angina. In addition to the medications reviewed today, we discussed taking aspirin 81 mg daily, initiating Zocor 10 mg q.h.s., and providing a prescription for as needed nitroglycerin. Office followup arranged. We will obtain fasting lipid profile and liver function tests for his next visit. I asked him to let us know sooner if he starts to develop chest pain or progressive shortness of breath that may need to be evaluated further.

## 2011-07-28 NOTE — Assessment & Plan Note (Signed)
Nonobstructive disease by recent Dopplers.

## 2011-07-28 NOTE — Progress Notes (Signed)
Clinical Summary [name] is a 66 y.o.male presenting for post hospital followup. He was seen in consultation at [facility] is a new patient recently with medically managed NSTEMI. History is reviewed below. He has had no regular cardiology followup over the years, although did undergo a cardiac clearance by [facility] around the time of his previous vascular surgery.  Recent lab work showed AST 41, ALT 75, cholesterol 157, triglycerides 109, HDL 49, LDL 86, potassium 3.6, BUN 33, creatinine 0.7, hemoglobin 14.0, platelets 233.  He is here with his granddaughter this morning. States that he feels much better since hospitalization, no progressive shortness of breath, no angina. We discussed in detail the findings of his hospitalization from a cardiac perspective, also his prior stress test done at [facility] internal medicine. Discussed diagnosis of coronary artery disease, and previous myocardial infarction. We discussed the rationale for ongoing medical therapy, and at this point he was comfortable with observation. We did review the possibility of a cardiac catheterization, particularly if he becomes more symptomatic despite adequate medical treatment.   No Known Allergies  Current Outpatient Prescriptions  Medication Sig Dispense Refill  . albuterol (PROAIR HFA) 108 (90 BASE) MCG/ACT inhaler Inhale 2 puffs into the lungs every 6 (six) hours as needed.      Marland Kitchen albuterol (PROVENTIL) (2.5 MG/3ML) 0.083% nebulizer solution Take 2.5 mg by nebulization every 6 (six) hours as needed.      . clopidogrel (PLAVIX) 75 MG tablet Take 75 mg by mouth daily.      . Fluticasone-Salmeterol (ADVAIR DISKUS) 500-50 MCG/DOSE AEPB Inhale 1 puff into the lungs every 12 (twelve) hours.      . isosorbide mononitrate (IMDUR) 30 MG 24 hr tablet Take 30 mg by mouth daily.      Marland Kitchen lisinopril (PRINIVIL,ZESTRIL) 10 MG tablet Take 10 mg by mouth daily.      . metoprolol succinate (TOPROL-XL) 50 MG 24 hr tablet Take 50 mg by mouth daily.  Take with or immediately following a meal.      . tiotropium (SPIRIVA) 18 MCG inhalation capsule Place 18 mcg into inhaler and inhale daily.      . nitroGLYCERIN (NITROSTAT) 0.4 MG SL tablet Place 1 tablet (0.4 mg total) under the tongue every 5 (five) minutes as needed for chest pain.  25 tablet  3  . simvastatin (ZOCOR) 10 MG tablet Take 1 tablet (10 mg total) by mouth every evening.  30 tablet  6    Past Medical History  Diagnosis Date  . COPD (chronic obstructive pulmonary disease)   . Essential hypertension, benign   . Peripheral arterial disease   . NSTEMI (non-ST elevated myocardial infarction)     12/12  . Carotid artery disease     50-69% RICA and less than 50% LICA  . Coronary atherosclerosis of native coronary artery     Based on abnormal Myoview - inferior scar, LVEF 45-50%    Social History [name] reports that he has been smoking Cigarettes.  He has a 20 pack-year smoking history. He has never used smokeless tobacco. [name] reports that he does not drink alcohol.  Review of Systems No palpitations or syncope. No reported bleeding problems. Negative except as outlined above.  Physical Examination Filed Vitals:   07/28/11 1045  BP: 148/76  Pulse: 72   Overweight male in no acute distress. HEENT: Conjunctiva and lids normal, oropharynx clear. Neck: Supple, no elevated JVP, bilateral carotid bruits, no thyromegaly. Lungs: Diminished breath sounds, nonlabored breathing at  rest, no active wheezing. Cardiac: Regular rate and rhythm, no S3, no pericardial rub. Abdomen: Soft, nontender, no hepatomegaly, bowel sounds present, no guarding or rebound. Extremities: No pitting edema, distal pulses diminished. Skin: Warm and dry. Musculoskeletal: No kyphosis. Neuropsychiatric: Alert and oriented x3, affect grossly appropriate.    Problem List and Plan

## 2011-07-28 NOTE — Assessment & Plan Note (Signed)
Plan add low-dose statin for plaque stabilization, recent LDL 86.

## 2011-07-28 NOTE — Assessment & Plan Note (Signed)
Status post revascularization surgery by Dr. Hart Rochester, outlined above.

## 2011-07-28 NOTE — Patient Instructions (Signed)
Your physician wants you to follow-up in: 3 months. You will receive a reminder letter in the mail one-two months in advance. If you don't receive a letter, please call our office to schedule the follow-up appointment. Start Zocor (simvastatin) 10 mg Take 1 tablet by mouth every night.  Nitroglycerin 0.4 mg-Place one tablet under tongue every 5 minutes up to 3 doses as needed for chest pain. No more than 3 doses over a 15 minute period.  Your physician recommends that you go to the [facility] for a FASTING lipid profile and liver function labs. Do not eat or drink after midnight. DO LABS IN 3 MONTHS.

## 2011-09-01 ENCOUNTER — Telehealth: Payer: Self-pay | Admitting: *Deleted

## 2011-09-01 NOTE — Telephone Encounter (Signed)
Daughter stated that patient has a feeling like an elephant is sitting on his chest for the past week. Daughter said that she can't get patient to go to ED. Nurse advised daughter that patient should use his nitroglycerin and also get transferred to ED or she can call 911 to have paramedics come to home for assessment. Daughter  Verbalized understanding.

## 2011-09-11 ENCOUNTER — Encounter: Payer: Self-pay | Admitting: Vascular Surgery

## 2011-09-12 ENCOUNTER — Encounter (INDEPENDENT_AMBULATORY_CARE_PROVIDER_SITE_OTHER): Payer: Medicare HMO | Admitting: Vascular Surgery

## 2011-09-12 DIAGNOSIS — Z48812 Encounter for surgical aftercare following surgery on the circulatory system: Secondary | ICD-10-CM

## 2011-09-12 DIAGNOSIS — I739 Peripheral vascular disease, unspecified: Secondary | ICD-10-CM

## 2011-09-20 ENCOUNTER — Other Ambulatory Visit: Payer: Self-pay | Admitting: *Deleted

## 2011-09-20 DIAGNOSIS — I739 Peripheral vascular disease, unspecified: Secondary | ICD-10-CM

## 2011-09-20 DIAGNOSIS — Z48812 Encounter for surgical aftercare following surgery on the circulatory system: Secondary | ICD-10-CM

## 2011-09-21 ENCOUNTER — Other Ambulatory Visit: Payer: Self-pay | Admitting: Internal Medicine

## 2011-09-21 ENCOUNTER — Encounter: Payer: Self-pay | Admitting: Vascular Surgery

## 2011-09-21 NOTE — Procedures (Unsigned)
BYPASS GRAFT EVALUATION  INDICATION:  Peripheral vascular disease  HISTORY: Diabetes:  No Cardiac:  No Hypertension:  Yes Smoking:  Currently Previous Surgery:  Right axillofemoral bypass graft and right to left femoral to femoral bypass graft on 09/14/2010  SINGLE LEVEL ARTERIAL EXAM                              RIGHT              LEFT Brachial: Anterior tibial: Posterior tibial: Peroneal: Ankle/brachial index:        0.83               0.94  PREVIOUS ABI:  Date:  10/04/2010  RIGHT:  1.01  LEFT:  1.06  LOWER EXTREMITY BYPASS GRAFT DUPLEX EXAM:  DUPLEX:  Elevated velocities present involving the right mid superficial femoral artery suggestive of 30%-49% stenosis with heterogeneous plaque present. Elevated velocities involving the right mid and proximal subclavian artery suggestive of a 50%-75% stenosis with peak systolic velocities over 200 cm/s.  IMPRESSION: 1. Patent right axillofemoral bypass graft. 2. Patent right to left femoral to femoral bypass graft. 3. Native artery stenosis present is noted above and on worksheet. 4. Ankle brachial indices are in the mild claudication range. 5. Decrease in ankle brachial indices since previous study on     10/04/2010.     ___________________________________________ Quita Skye. Hart Rochester, M.D.  SH/MEDQ  D:  09/12/2011  T:  09/12/2011  Job:  [id number]

## 2011-11-10 ENCOUNTER — Encounter: Payer: Self-pay | Admitting: *Deleted

## 2011-11-29 ENCOUNTER — Ambulatory Visit: Payer: Medicare HMO | Admitting: Cardiology

## 2012-01-05 ENCOUNTER — Ambulatory Visit (INDEPENDENT_AMBULATORY_CARE_PROVIDER_SITE_OTHER): Payer: Medicare HMO | Admitting: Cardiology

## 2012-01-05 ENCOUNTER — Encounter: Payer: Self-pay | Admitting: Cardiology

## 2012-01-05 VITALS — BP 128/82 | HR 86 | Ht 65.0 in | Wt 149.8 lb

## 2012-01-05 DIAGNOSIS — I10 Essential (primary) hypertension: Secondary | ICD-10-CM

## 2012-01-05 DIAGNOSIS — I2510 Atherosclerotic heart disease of native coronary artery without angina pectoris: Secondary | ICD-10-CM

## 2012-01-05 DIAGNOSIS — E782 Mixed hyperlipidemia: Secondary | ICD-10-CM

## 2012-01-05 DIAGNOSIS — Z79899 Other long term (current) drug therapy: Secondary | ICD-10-CM

## 2012-01-05 DIAGNOSIS — E785 Hyperlipidemia, unspecified: Secondary | ICD-10-CM

## 2012-01-05 DIAGNOSIS — I739 Peripheral vascular disease, unspecified: Secondary | ICD-10-CM

## 2012-01-05 NOTE — Assessment & Plan Note (Signed)
Continue medical management for now. He is symptomatically stable on medical therapy. Followup arranged.

## 2012-01-05 NOTE — Assessment & Plan Note (Signed)
Blood pressure looks reasonable today. 

## 2012-01-05 NOTE — Assessment & Plan Note (Signed)
Recent ABIs reviewed.

## 2012-01-05 NOTE — Patient Instructions (Addendum)
Your physician recommends that you schedule a follow-up appointment in: 6 months with Dr. Diona Browner. You will receive a reminder letter in the mail in about 4 months reminding you to call and schedule your appointment. If you don't receive this letter, please contact our office.   Your physician recommends that you continue on your current medications as directed. Please refer to the Current Medication list given to you today.   Your physician recommends that you return for a FASTING lipid/liver profile: today at [facility].

## 2012-01-05 NOTE — Assessment & Plan Note (Signed)
Followup FLP and LFT. 

## 2012-01-05 NOTE — Progress Notes (Signed)
Clinical Summary Mr. Folz is a 66 y.o.male presenting for followup. He was seen back in January. Record review finds hospitalization more head back in March after a fall with right-sided chest pain secondary to rib fracture. He was also treated for COPD exacerbation.  ABIs from March of this year were 0.83 on the right, and 0.95 on the left. Lipids from January showed cholesterol 157, triglycerides 109, HDL 49, LDL 86. He has not had followup lipid profile as yet.  He reports using sublingual nitroglycerin on 3 occasions in the last 6 months. Denies any progressive angina. Most of his limitation seems to be related to significant COPD.   No Known Allergies  Current Outpatient Prescriptions  Medication Sig Dispense Refill  . albuterol (PROAIR HFA) 108 (90 BASE) MCG/ACT inhaler Inhale 2 puffs into the lungs every 6 (six) hours as needed.      Marland Kitchen albuterol (PROVENTIL) (2.5 MG/3ML) 0.083% nebulizer solution Take 2.5 mg by nebulization every 6 (six) hours as needed.      . clopidogrel (PLAVIX) 75 MG tablet Take 75 mg by mouth daily.      . Fluticasone-Salmeterol (ADVAIR DISKUS) 500-50 MCG/DOSE AEPB Inhale 1 puff into the lungs every 12 (twelve) hours.      . isosorbide mononitrate (IMDUR) 30 MG 24 hr tablet Take 30 mg by mouth daily.      Marland Kitchen lisinopril (PRINIVIL,ZESTRIL) 10 MG tablet Take 10 mg by mouth daily.      . metoprolol succinate (TOPROL-XL) 50 MG 24 hr tablet Take 50 mg by mouth daily. Take with or immediately following a meal.      . nitroGLYCERIN (NITROSTAT) 0.4 MG SL tablet Place 1 tablet (0.4 mg total) under the tongue every 5 (five) minutes as needed for chest pain.  25 tablet  3  . simvastatin (ZOCOR) 10 MG tablet Take 1 tablet (10 mg total) by mouth every evening.  30 tablet  6  . tiotropium (SPIRIVA) 18 MCG inhalation capsule Place 18 mcg into inhaler and inhale daily.        Past Medical History  Diagnosis Date  . COPD (chronic obstructive pulmonary disease)   . Essential  hypertension, benign   . Peripheral arterial disease   . NSTEMI (non-ST elevated myocardial infarction)     12/12  . Carotid artery disease     50-69% RICA and less than 50% LICA  . Coronary atherosclerosis of native coronary artery     Based on abnormal Myoview - inferior scar, LVEF 45-50%    Social History Mr. Kutzer reports that he has been smoking Cigarettes.  He has a 20 pack-year smoking history. He has never used smokeless tobacco. Mr. Mincey reports that he does not drink alcohol.  Review of Systems No palpitations or dizziness. No reported bleeding problems. Has regular wheezing with COPD. Otherwise negative.  Physical Examination Filed Vitals:   01/05/12 0937  BP: 128/82  Pulse: 86    Overweight male in no acute distress.  HEENT: Conjunctiva and lids normal, oropharynx clear.  Neck: Supple, no elevated JVP, bilateral carotid bruits, no thyromegaly.  Lungs: Diminished breath sounds, nonlabored breathing at rest, no active wheezing.  Cardiac: Regular rate and rhythm, no S3, no pericardial rub.  Abdomen: Soft, nontender, no hepatomegaly, bowel sounds present, no guarding or rebound.  Extremities: No pitting edema, distal pulses diminished.  Skin: Warm and dry.  Musculoskeletal: No kyphosis.  Neuropsychiatric: Alert and oriented x3, affect grossly appropriate.    Problem List and Plan  Coronary atherosclerosis of native coronary artery Continue medical management for now. He is symptomatically stable on medical therapy. Followup arranged.  Essential hypertension, benign Blood pressure looks reasonable today.  Mixed hyperlipidemia Followup FLP and LFT.  Peripheral arterial disease Recent ABIs reviewed.    Jonelle Sidle, M.D., F.A.C.C.

## 2012-01-09 ENCOUNTER — Telehealth: Payer: Self-pay | Admitting: *Deleted

## 2012-01-09 NOTE — Telephone Encounter (Signed)
Message copied by Eustace Liss on Tue Jan 09, 2012  5:07 PM ------      Message from: MCDOWELL, Illene Bolus      Created: Tue Jan 09, 2012  9:07 AM       LFTs normal, LDL at goal at 32.

## 2012-01-09 NOTE — Telephone Encounter (Signed)
Patient informed. 

## 2012-03-22 ENCOUNTER — Ambulatory Visit: Payer: Medicare HMO | Admitting: Neurosurgery

## 2012-03-26 ENCOUNTER — Encounter: Payer: Self-pay | Admitting: Neurosurgery

## 2012-03-26 ENCOUNTER — Other Ambulatory Visit: Payer: Self-pay | Admitting: *Deleted

## 2012-03-26 ENCOUNTER — Encounter (INDEPENDENT_AMBULATORY_CARE_PROVIDER_SITE_OTHER): Payer: Medicare HMO | Admitting: *Deleted

## 2012-03-26 ENCOUNTER — Ambulatory Visit (INDEPENDENT_AMBULATORY_CARE_PROVIDER_SITE_OTHER): Payer: Medicaid Other | Admitting: Neurosurgery

## 2012-03-26 VITALS — BP 125/73 | HR 67 | Resp 16 | Ht 66.0 in | Wt 155.1 lb

## 2012-03-26 DIAGNOSIS — I739 Peripheral vascular disease, unspecified: Secondary | ICD-10-CM | POA: Insufficient documentation

## 2012-03-26 DIAGNOSIS — Z48812 Encounter for surgical aftercare following surgery on the circulatory system: Secondary | ICD-10-CM

## 2012-03-26 NOTE — Progress Notes (Signed)
[facility] [place] PAD/PVD Office Note  CC: Yearly PVD surveillance Referring Physician: Hart Rochester  History of Present Illness: 66 year old male patient of Dr. Hart Rochester is status post a right axilla femoral and right to left femoral femoral bypass graft in March 2012. The patient denies any signs of claudication or rest pain. Patient has no ulcerations on his lower extremities. The patient does state that he can ambulate now without any difficulty although he does have some slight numbness in his right foot since the surgery but nothing that is concerned about.  Past Medical History  Diagnosis Date  . COPD (chronic obstructive pulmonary disease)   . Essential hypertension, benign   . Peripheral arterial disease   . NSTEMI (non-ST elevated myocardial infarction)     12/12  . Carotid artery disease     50-69% RICA and less than 50% LICA  . Coronary atherosclerosis of native coronary artery     Based on abnormal Myoview - inferior scar, LVEF 45-50%    ROS: [x]  Positive   [ ]  Denies    General: [ ]  Weight loss, [ ]  Fever, [ ]  chills Neurologic: [ ]  Dizziness, [ ]  Blackouts, [ ]  Seizure [ ]  Stroke, [ ]  "Mini stroke", [ ]  Slurred speech, [ ]  Temporary blindness; [ ]  weakness in arms or legs, [ ]  Hoarseness Cardiac: [ ]  Chest pain/pressure, [ ]  Shortness of breath at rest [ ]  Shortness of breath with exertion, [ ]  Atrial fibrillation or irregular heartbeat Vascular: [ ]  Pain in legs with walking, [ ]  Pain in legs at rest, [ ]  Pain in legs at night,  [ ]  Non-healing ulcer, [ ]  Blood clot in vein/DVT,   Pulmonary: [ ]  Home oxygen, [ ]  Productive cough, [ ]  Coughing up blood, [ ]  Asthma,  [ ]  Wheezing Musculoskeletal:  [ ]  Arthritis, [ ]  Low back pain, [ ]  Joint pain Hematologic: [ ]  Easy Bruising, [ ]  Anemia; [ ]  Hepatitis Gastrointestinal: [ ]  Blood in stool, [ ]  Gastroesophageal Reflux/heartburn, [ ]  Trouble swallowing Urinary: [ ]  chronic Kidney disease, [ ]  on HD - [ ]   MWF or [ ]  TTHS, [ ]  Burning with urination, [ ]  Difficulty urinating Skin: [ ]  Rashes, [ ]  Wounds Psychological: [ ]  Anxiety, [ ]  Depression   Social History History  Substance Use Topics  . Smoking status: Current Every Day Smoker -- 0.5 packs/day for 40 years    Types: Cigarettes  . Smokeless tobacco: Never Used   Comment: Patient has not somked since midnight on wednesday.   . Alcohol Use: No    Family History History reviewed. No pertinent family history.  No Known Allergies  Current Outpatient Prescriptions  Medication Sig Dispense Refill  . albuterol (PROAIR HFA) 108 (90 BASE) MCG/ACT inhaler Inhale 2 puffs into the lungs every 6 (six) hours as needed.      Marland Kitchen albuterol (PROVENTIL) (2.5 MG/3ML) 0.083% nebulizer solution Take 2.5 mg by nebulization every 6 (six) hours as needed.      . Fluticasone-Salmeterol (ADVAIR DISKUS) 500-50 MCG/DOSE AEPB Inhale 1 puff into the lungs every 12 (twelve) hours.      Marland Kitchen lisinopril-hydrochlorothiazide (PRINZIDE,ZESTORETIC) 20-12.5 MG per tablet daily.      . nitroGLYCERIN (NITROSTAT) 0.4 MG SL tablet Place 1 tablet (0.4 mg total) under the tongue every 5 (five) minutes as needed for chest pain.  25 tablet  3  . tiotropium (SPIRIVA) 18 MCG inhalation capsule Place 18 mcg into inhaler and  inhale daily.      . clopidogrel (PLAVIX) 75 MG tablet Take 75 mg by mouth daily.      . isosorbide mononitrate (IMDUR) 30 MG 24 hr tablet Take 30 mg by mouth daily.      Marland Kitchen lisinopril (PRINIVIL,ZESTRIL) 10 MG tablet Take 10 mg by mouth daily.      . metoprolol succinate (TOPROL-XL) 50 MG 24 hr tablet Take 50 mg by mouth daily. Take with or immediately following a meal.      . omeprazole (PRILOSEC) 20 MG capsule daily.      . simvastatin (ZOCOR) 10 MG tablet Take 1 tablet (10 mg total) by mouth every evening.  30 tablet  6    Physical Examination  Filed Vitals:   03/26/12 1204  BP: 125/73  Pulse: 67  Resp: 16    Body mass index is 25.03  kg/(m^2).  General:  WDWN in NAD Gait: Normal HEENT: WNL Eyes: Pupils equal Pulmonary: normal non-labored breathing , without Rales, rhonchi,  wheezing Cardiac: RRR, without  Murmurs, rubs or gallops; No carotid bruits Abdomen: soft, NT, no masses Skin: no rashes, ulcers noted Vascular Exam/Pulses: Palpable femoral pulses, PT and DP pulses are palpated bilaterally  Extremities without ischemic changes, no Gangrene , no cellulitis; no open wounds;  Musculoskeletal: no muscle wasting or atrophy  Neurologic: A&O X 3; Appropriate Affect ; SENSATION: normal; MOTOR FUNCTION:  moving all extremities equally. Speech is fluent/normal  Non-Invasive Vascular Imaging: ABIs today are 1.06 and triphasic to biphasic on the right, 1.02 and triphasic on the left which is slight improvement from previous ABIs in March 2013  ASSESSMENT/PLAN: Patient with greater than improvement with no claudication status post above procedure. The patient return in one year for repeat ABIs. The patient's questions were encouraged and answered, he is in agreement with this plan.  Lauree Chandler ANP  Clinic M.D.: Hart Rochester

## 2012-03-27 ENCOUNTER — Ambulatory Visit: Payer: Medicare HMO | Admitting: Neurosurgery

## 2012-05-24 ENCOUNTER — Other Ambulatory Visit: Payer: Self-pay | Admitting: Cardiology

## 2013-01-30 ENCOUNTER — Encounter (HOSPITAL_COMMUNITY)
Admission: RE | Admit: 2013-01-30 | Discharge: 2013-01-30 | Disposition: A | Discharge status: Home / Self Care | Payer: Medicare Other | Source: Ambulatory Visit | Attending: Pulmonary Disease | Admitting: Pulmonary Disease

## 2013-01-30 ENCOUNTER — Encounter (HOSPITAL_COMMUNITY): Payer: Self-pay

## 2013-01-30 VITALS — BP 122/70 | HR 76 | Ht 65.0 in | Wt 174.0 lb

## 2013-01-30 DIAGNOSIS — I219 Acute myocardial infarction, unspecified: Secondary | ICD-10-CM

## 2013-01-30 DIAGNOSIS — Z5189 Encounter for other specified aftercare: Secondary | ICD-10-CM | POA: Insufficient documentation

## 2013-01-30 DIAGNOSIS — J4489 Other specified chronic obstructive pulmonary disease: Secondary | ICD-10-CM | POA: Insufficient documentation

## 2013-01-30 DIAGNOSIS — J449 Chronic obstructive pulmonary disease, unspecified: Secondary | ICD-10-CM

## 2013-01-30 NOTE — Progress Notes (Signed)
Patient referred to [facility] from Dr. Sabino Niemann. During orientation advised patient on arrival and appointment times what to wear, what to do before, during and after exercise. Reviewed attendance and class policy. Talked about inclement weather and class consultation policy. Pt is scheduled to start Pulm Rehab on 02/03/13 at 1pm. Pt was advised to come to class 5 minutes before class starts. He was also given instructions on meeting with the dietician and attending the Family Structure classes. Pt is eager to get started.

## 2013-01-30 NOTE — Patient Instructions (Signed)
Pt has finished orientation and is scheduled to start PR on 02/03/13 at 1 pm. Pt has been instructed to arrive to class 15 minutes early for scheduled class. Pt has been instructed to wear comfortable clothing and shoes with rubber soles. Pt has been told to take their medications 1 hour prior to coming to class.  If the patient is not going to attend class, he/she has been instructed to call.

## 2013-02-03 ENCOUNTER — Encounter (HOSPITAL_COMMUNITY)
Admission: RE | Admit: 2013-02-03 | Discharge: 2013-02-03 | Disposition: A | Discharge status: Home / Self Care | Payer: Medicare Other | Source: Ambulatory Visit | Attending: Pulmonary Disease | Admitting: Pulmonary Disease

## 2013-02-05 ENCOUNTER — Encounter (HOSPITAL_COMMUNITY): Payer: Medicare Other

## 2013-02-10 ENCOUNTER — Encounter (HOSPITAL_COMMUNITY): Payer: Medicare Other

## 2013-02-12 ENCOUNTER — Encounter (HOSPITAL_COMMUNITY): Payer: Medicare Other

## 2013-02-17 ENCOUNTER — Encounter (HOSPITAL_COMMUNITY)
Admission: RE | Admit: 2013-02-17 | Discharge: 2013-02-17 | Disposition: A | Discharge status: Home / Self Care | Payer: Medicare Other | Source: Ambulatory Visit | Attending: Pulmonary Disease | Admitting: Pulmonary Disease

## 2013-02-17 DIAGNOSIS — J4489 Other specified chronic obstructive pulmonary disease: Secondary | ICD-10-CM | POA: Insufficient documentation

## 2013-02-17 DIAGNOSIS — J449 Chronic obstructive pulmonary disease, unspecified: Secondary | ICD-10-CM | POA: Insufficient documentation

## 2013-02-17 DIAGNOSIS — Z5189 Encounter for other specified aftercare: Secondary | ICD-10-CM | POA: Insufficient documentation

## 2013-02-19 ENCOUNTER — Encounter (HOSPITAL_COMMUNITY)
Admission: RE | Admit: 2013-02-19 | Discharge: 2013-02-19 | Disposition: A | Discharge status: Home / Self Care | Payer: Medicare Other | Source: Ambulatory Visit | Attending: Pulmonary Disease | Admitting: Pulmonary Disease

## 2013-02-24 ENCOUNTER — Encounter (HOSPITAL_COMMUNITY): Payer: Medicare Other

## 2013-02-24 ENCOUNTER — Ambulatory Visit: Payer: Medicare Other | Admitting: Physician Assistant

## 2013-02-26 ENCOUNTER — Encounter (HOSPITAL_COMMUNITY): Payer: Medicare Other

## 2013-02-27 ENCOUNTER — Institutional Professional Consult (permissible substitution): Payer: Medicare Other | Admitting: Critical Care Medicine

## 2013-03-03 ENCOUNTER — Encounter (HOSPITAL_COMMUNITY): Payer: Medicare Other

## 2013-03-05 ENCOUNTER — Encounter (HOSPITAL_COMMUNITY): Payer: Medicare Other

## 2013-03-06 ENCOUNTER — Encounter: Payer: Self-pay | Admitting: Physician Assistant

## 2013-03-06 ENCOUNTER — Ambulatory Visit (INDEPENDENT_AMBULATORY_CARE_PROVIDER_SITE_OTHER): Payer: Medicare Other | Admitting: Physician Assistant

## 2013-03-06 VITALS — BP 160/81 | HR 99 | Ht 63.0 in | Wt 179.4 lb

## 2013-03-06 DIAGNOSIS — Z79899 Other long term (current) drug therapy: Secondary | ICD-10-CM

## 2013-03-06 DIAGNOSIS — E785 Hyperlipidemia, unspecified: Secondary | ICD-10-CM

## 2013-03-06 DIAGNOSIS — R0600 Dyspnea, unspecified: Secondary | ICD-10-CM

## 2013-03-06 DIAGNOSIS — Z720 Tobacco use: Secondary | ICD-10-CM

## 2013-03-06 DIAGNOSIS — R0602 Shortness of breath: Secondary | ICD-10-CM

## 2013-03-06 DIAGNOSIS — I2510 Atherosclerotic heart disease of native coronary artery without angina pectoris: Secondary | ICD-10-CM

## 2013-03-06 DIAGNOSIS — E782 Mixed hyperlipidemia: Secondary | ICD-10-CM

## 2013-03-06 DIAGNOSIS — F17200 Nicotine dependence, unspecified, uncomplicated: Secondary | ICD-10-CM

## 2013-03-06 DIAGNOSIS — R0609 Other forms of dyspnea: Secondary | ICD-10-CM

## 2013-03-06 DIAGNOSIS — I10 Essential (primary) hypertension: Secondary | ICD-10-CM

## 2013-03-06 MED ORDER — ASPIRIN EC 81 MG PO TBEC: 81.0000 mg | DELAYED_RELEASE_TABLET | Freq: Every day | ORAL | Status: DC

## 2013-03-06 MED ORDER — NITROGLYCERIN 0.4 MG SL SUBL: 0.4000 mg | SUBLINGUAL_TABLET | SUBLINGUAL | Status: DC | PRN

## 2013-03-06 NOTE — Assessment & Plan Note (Signed)
Patient quit smoking approximately 1 year ago.

## 2013-03-06 NOTE — Assessment & Plan Note (Signed)
I suspect this is primarily due to COPD exacerbation, based on PE findings. Nevertheless, given his history of presumed CAD with an abnormal Myoview in November 2012, as well as mild LVD (EF 45-50%), we will reassess LVF with a complete echocardiogram. I will also order a 2v CXR and BNP level, to exclude the possibility of pulmonary edema, as a possible contributing factor. Of note, patient has gained 30 pounds since last OV, but does not appear volume overloaded by exam. Do not recommend a repeat ischemic evaluation at this time, pending review of echocardiogram results. Additionally, patient suggests no significant change from his baseline level of exercise tolerance, and denies exertional CP.

## 2013-03-06 NOTE — Progress Notes (Signed)
Primary Cardiologist: Simona Huh, MD   HPI: Patient presents for evaluation of SOB, per cardiopulmonary rehabilitation department [facility]. He was last seen here in clinic in June 2013, by Dr. Diona Browner, at which time no medication adjustments were recommended.  He reports having been recently referred for CP rehabilitation by his pulmonologist in [place] (cannot recall his name), whom he saw 1 month ago. He was able to complete 3 sessions, but then was advised to follow up with [place] due to persistent SOB.  However, he suggests long-standing SOB for several years, but denies any recent exacerbation. He reports chronic orthopnea, but denies PND or significant peripheral edema. He has a "little chest pain", which he has had for years, but denies any exertional CP.   12-lead EKG today, reviewed by, indicates NSR 99 bpm; NL axis; NSST changes  No Known Allergies  Current Outpatient Prescriptions  Medication Sig Dispense Refill  . albuterol (PROAIR HFA) 108 (90 BASE) MCG/ACT inhaler Inhale 2 puffs into the lungs every 6 (six) hours as needed.      Marland Kitchen albuterol (PROVENTIL) (2.5 MG/3ML) 0.083% nebulizer solution Take 2.5 mg by nebulization every 6 (six) hours as needed.      . clopidogrel (PLAVIX) 75 MG tablet Take 75 mg by mouth daily.      . fluticasone (FLONASE) 50 MCG/ACT nasal spray Place 1 spray into the nose.       Marland Kitchen Fluticasone-Salmeterol (ADVAIR DISKUS) 500-50 MCG/DOSE AEPB Inhale 1 puff into the lungs every 12 (twelve) hours.      . isosorbide mononitrate (IMDUR) 30 MG 24 hr tablet Take 30 mg by mouth daily.      Marland Kitchen lisinopril-hydrochlorothiazide (PRINZIDE,ZESTORETIC) 20-12.5 MG per tablet Take 1 tablet by mouth daily.       . metoprolol succinate (TOPROL-XL) 50 MG 24 hr tablet Take 50 mg by mouth daily. Take with or immediately following a meal.      . nitroGLYCERIN (NITROSTAT) 0.4 MG SL tablet Place 1 tablet (0.4 mg total) under the tongue every 5 (five) minutes as needed for chest pain.   25 tablet  3  . simvastatin (ZOCOR) 10 MG tablet TAKE 1 TABLET BY MOUTH EVERY EVENING.  30 tablet  6  . tiotropium (SPIRIVA) 18 MCG inhalation capsule Place 18 mcg into inhaler and inhale daily.      Marland Kitchen aspirin EC 81 MG tablet Take 1 tablet (81 mg total) by mouth daily.       No current facility-administered medications for this visit.    Past Medical History  Diagnosis Date  . COPD (chronic obstructive pulmonary disease)   . Essential hypertension, benign   . Peripheral arterial disease   . NSTEMI (non-ST elevated myocardial infarction)     12/12  . Carotid artery disease     50-69% RICA and less than 50% LICA, 07/2011  . Coronary atherosclerosis of native coronary artery     Based on abnormal Myoview - inferior scar, 05/2011; LVEF 45-50%, echo, 07/2011  . Tobacco abuse   . HLD (hyperlipidemia)     Past Surgical History  Procedure Laterality Date  . Right hydrocele repair    . Right axillary to femoral bypass  3/12    Dr. Hart Rochester  . Right to left femoral to femoral bypass  3/12    Dr. Hart Rochester  . Sigmoid resection / rectopexy  2005    History   Social History  . Marital Status: Divorced    Spouse Name: N/A    Number of  Children: N/A  . Years of Education: N/A   Occupational History  . Not on file.   Social History Main Topics  . Smoking status: Former Smoker -- 2.00 packs/day for 55 years    Quit date: 12/09/2011  . Smokeless tobacco: Never Used     Comment: Patient has not somked since midnight on wednesday.   . Alcohol Use: No  . Drug Use: No  . Sexual Activity: Not on file   Other Topics Concern  . Not on file   Social History Narrative  . No narrative on file    No family history on file.  ROS: no nausea, vomiting; no fever, chills; no melena, hematochezia; no claudication  PHYSICAL EXAM: BP 160/81  Pulse 99  Ht 5\' 3"  (1.6 m)  Wt 179 lb 6.4 oz (81.375 kg)  BMI 31.79 kg/m2 GENERAL: 67 year old male; NAD HEENT: NCAT, PERRLA, EOMI; sclera clear; no  xanthelasma NECK: Low pitched right carotid bruit; no JVD; no TM LUNGS: Diminished BS throughout, with faint late expiratory wheezes, more pronounced anteriorly CARDIAC: Regular rhythm, increased rate (S1, S2); no significant murmurs; no rubs or gallops ABDOMEN: soft, protuberant EXTREMETIES: trace pretibial edema SKIN: warm/dry; no obvious rash/lesions MUSCULOSKELETAL: no joint deformity NEURO: no focal deficit; NL affect   EKG: reviewed and available in Electronic Records   ASSESSMENT & PLAN:  Dyspnea I suspect this is primarily due to COPD exacerbation, based on PE findings. Nevertheless, given his history of presumed CAD with an abnormal Myoview in November 2012, as well as mild LVD (EF 45-50%), we will reassess LVF with a complete echocardiogram. I will also order a 2v CXR and BNP level, to exclude the possibility of pulmonary edema, as a possible contributing factor. Of note, patient has gained 30 pounds since last OV, but does not appear volume overloaded by exam. Do not recommend a repeat ischemic evaluation at this time, pending review of echocardiogram results. Additionally, patient suggests no significant change from his baseline level of exercise tolerance, and denies exertional CP.  Essential hypertension, benign Inadequately controlled on current medication regimen. To be reassessed at time of followup visit.  Tobacco abuse Patient quit smoking approximately 1 year ago.  HLD (hyperlipidemia) Will reassess lipid status with a FLP. Last assessment notable for LDL 77, in July 2013. Recommend continued aggressive management with target LDL 70 or less, if feasible.    Gene Syleena Mchan, PAC

## 2013-03-06 NOTE — Assessment & Plan Note (Signed)
Inadequately controlled on current medication regimen. To be reassessed at time of followup visit.

## 2013-03-06 NOTE — Assessment & Plan Note (Signed)
Will reassess lipid status with a FLP. Last assessment notable for LDL 77, in July 2013. Recommend continued aggressive management with target LDL 70 or less, if feasible.

## 2013-03-06 NOTE — Patient Instructions (Signed)
Your physician recommends that you have the following labs done:  Lipid panel, liver function, CMET, BNP, TSH, CBC w/ diff Chest x-ray can be done same day as labs.  A chest x-ray takes a picture of the organs and structures inside the chest, including the heart, lungs, and blood vessels. This test can show several things, including, whether the heart is enlarges; whether fluid is building up in the lungs; and whether pacemaker / defibrillator leads are still in place. Your physician has requested that you have an echocardiogram. Echocardiography is a painless test that uses sound waves to create images of your heart. It provides your doctor with information about the size and shape of your heart and how well your heart's chambers and valves are working. This procedure takes approximately one hour. There are no restrictions for this procedure. Office will contact with results via phone or letter.   Continue all current medications. Follow up in  1 week

## 2013-03-07 ENCOUNTER — Ambulatory Visit: Payer: Medicare Other | Admitting: Physician Assistant

## 2013-03-10 ENCOUNTER — Encounter (HOSPITAL_COMMUNITY): Payer: Medicare Other

## 2013-03-12 ENCOUNTER — Encounter (HOSPITAL_COMMUNITY): Payer: Medicare Other

## 2013-03-13 ENCOUNTER — Telehealth: Payer: Self-pay | Admitting: *Deleted

## 2013-03-13 ENCOUNTER — Other Ambulatory Visit: Payer: Self-pay

## 2013-03-13 ENCOUNTER — Other Ambulatory Visit (INDEPENDENT_AMBULATORY_CARE_PROVIDER_SITE_OTHER): Payer: Medicare Other

## 2013-03-13 DIAGNOSIS — R079 Chest pain, unspecified: Secondary | ICD-10-CM

## 2013-03-13 DIAGNOSIS — R0602 Shortness of breath: Secondary | ICD-10-CM

## 2013-03-13 NOTE — Telephone Encounter (Signed)
Message copied by Eustace Manzer on Thu Mar 13, 2013  2:33 PM ------      Message from: Jonelle Sidle      Created: Tue Mar 11, 2013  2:13 PM       Ordered by Mr. Serpe PA-C at recent visit. No obvious acute pulmonary findings. ------

## 2013-03-13 NOTE — Telephone Encounter (Signed)
Patient informed. 

## 2013-03-17 ENCOUNTER — Encounter: Payer: Self-pay | Admitting: Cardiovascular Disease

## 2013-03-17 ENCOUNTER — Ambulatory Visit (INDEPENDENT_AMBULATORY_CARE_PROVIDER_SITE_OTHER): Payer: Medicare Other | Admitting: Cardiovascular Disease

## 2013-03-17 ENCOUNTER — Encounter (HOSPITAL_COMMUNITY): Payer: Medicare Other

## 2013-03-17 VITALS — BP 196/99 | HR 83 | Ht 63.0 in | Wt 178.0 lb

## 2013-03-17 DIAGNOSIS — J449 Chronic obstructive pulmonary disease, unspecified: Secondary | ICD-10-CM

## 2013-03-17 DIAGNOSIS — I779 Disorder of arteries and arterioles, unspecified: Secondary | ICD-10-CM

## 2013-03-17 DIAGNOSIS — Z720 Tobacco use: Secondary | ICD-10-CM

## 2013-03-17 DIAGNOSIS — E782 Mixed hyperlipidemia: Secondary | ICD-10-CM

## 2013-03-17 DIAGNOSIS — R0602 Shortness of breath: Secondary | ICD-10-CM

## 2013-03-17 DIAGNOSIS — I10 Essential (primary) hypertension: Secondary | ICD-10-CM

## 2013-03-17 DIAGNOSIS — R0600 Dyspnea, unspecified: Secondary | ICD-10-CM

## 2013-03-17 DIAGNOSIS — F17200 Nicotine dependence, unspecified, uncomplicated: Secondary | ICD-10-CM

## 2013-03-17 DIAGNOSIS — R0609 Other forms of dyspnea: Secondary | ICD-10-CM

## 2013-03-17 DIAGNOSIS — I2510 Atherosclerotic heart disease of native coronary artery without angina pectoris: Secondary | ICD-10-CM

## 2013-03-17 DIAGNOSIS — Z79899 Other long term (current) drug therapy: Secondary | ICD-10-CM

## 2013-03-17 MED ORDER — LISINOPRIL 20 MG PO TABS: 20.0000 mg | ORAL_TABLET | Freq: Every evening | ORAL | Status: DC

## 2013-03-17 MED ORDER — SIMVASTATIN 20 MG PO TABS: 20.0000 mg | ORAL_TABLET | Freq: Every day | ORAL | Status: DC

## 2013-03-17 MED ORDER — LISINOPRIL-HYDROCHLOROTHIAZIDE 20-25 MG PO TABS: 1.0000 | ORAL_TABLET | Freq: Every morning | ORAL | Status: DC

## 2013-03-17 NOTE — Patient Instructions (Addendum)
   Increase Prinzide (Lisinopril/HCTZ) to 20/25mg  every morning - new sent to pharm  Add Lisinopril 20mg  every evening - new sent to pharm  Increase Simvastatin to 20mg  every evening - new sent to pharm   Labs will be needed in 4 months for fasting lipid and liver panel - will mail reminder when time  Continue all other medications.    Lab for BMET - due in 1 week  Office will contact with results via phone or letter.    Referral back to Pulmonology for COPD  Nurse visit in 2 weeks for blood pressure check   Your physician wants you to follow up in: 6 months.  You will receive a reminder letter in the mail one-two months in advance.  If you don't receive a letter, please call our office to schedule the follow up appointment

## 2013-03-17 NOTE — Progress Notes (Signed)
Patient ID: [name], male   DOB: [birth year], 67 y.o.   MRN: [id number]   SUBJECTIVE: [name] was recently seen by Rozell Searing, PA. Multiple tests were ordered at that time for the patient's SOB, albeit it was mentioned that many issues appear to be related to his COPD.  A chest xray showed no infiltrate or pulmonary edema, and only mild bronchitic changes. A BNP was 41, TSH normal, and lipid panel showed TC 184, TG 87, HDL 56, and LDL 111.  An echocardiogram which I interpreted revealed the following:  Left ventricle: Technically difficult study. Diastology was not assessed. Patient was tachycardic. The cavity size was normal. Wall thickness was increased in a pattern of mild to moderateLVH. Systolic function was normal. The estimated ejection fraction was in the range of 60% to 65%. Images were inadequate for LV wall motion assessment. Would recommend contrast for more accurate regional wall motion and systolic functionassessment. - Atrial septum: There was increased thickness of the septum, consistent with lipomatous hypertrophy.   His BP is markedly elevated today, and was noted to be elevated at his last office visit.  He used to see a pulmonologist. He gets dyspneic with minimal exertion. He had a 52 yr h/o smoking, and quit last year. He very seldom gets very brief instances of chest pain, lasting a second or two.    No Known Allergies  Current Outpatient Prescriptions  Medication Sig Dispense Refill  . albuterol (PROAIR HFA) 108 (90 BASE) MCG/ACT inhaler Inhale 2 puffs into the lungs every 6 (six) hours as needed.      Marland Kitchen albuterol (PROVENTIL) (2.5 MG/3ML) 0.083% nebulizer solution Take 2.5 mg by nebulization every 6 (six) hours as needed.      Marland Kitchen aspirin EC 81 MG tablet Take 1 tablet (81 mg total) by mouth daily.      . clopidogrel (PLAVIX) 75 MG tablet Take 75 mg by mouth daily.      . fluticasone (FLONASE) 50 MCG/ACT nasal spray Place 1 spray into the nose.       Marland Kitchen  Fluticasone-Salmeterol (ADVAIR DISKUS) 500-50 MCG/DOSE AEPB Inhale 1 puff into the lungs every 12 (twelve) hours.      . isosorbide mononitrate (IMDUR) 30 MG 24 hr tablet Take 30 mg by mouth daily.      Marland Kitchen lisinopril-hydrochlorothiazide (PRINZIDE,ZESTORETIC) 20-12.5 MG per tablet Take 1 tablet by mouth daily.       . metoprolol succinate (TOPROL-XL) 50 MG 24 hr tablet Take 50 mg by mouth daily. Take with or immediately following a meal.      . nitroGLYCERIN (NITROSTAT) 0.4 MG SL tablet Place 1 tablet (0.4 mg total) under the tongue every 5 (five) minutes as needed for chest pain.  25 tablet  3  . simvastatin (ZOCOR) 10 MG tablet TAKE 1 TABLET BY MOUTH EVERY EVENING.  30 tablet  6  . tiotropium (SPIRIVA) 18 MCG inhalation capsule Place 18 mcg into inhaler and inhale daily.       No current facility-administered medications for this visit.    Past Medical History  Diagnosis Date  . COPD (chronic obstructive pulmonary disease)   . Essential hypertension, benign   . Peripheral arterial disease   . NSTEMI (non-ST elevated myocardial infarction)     12/12  . Carotid artery disease     50-69% RICA and less than 50% LICA, 07/2011  . Coronary atherosclerosis of native coronary artery     Based on abnormal Myoview - inferior scar, 05/2011;  LVEF 45-50%, echo, 07/2011  . Tobacco abuse   . HLD (hyperlipidemia)     Past Surgical History  Procedure Laterality Date  . Right hydrocele repair    . Right axillary to femoral bypass  3/12    Dr. Hart [name]  . Right to left femoral to femoral bypass  3/12    Dr. Hart [name]  . Sigmoid resection / rectopexy  2005    History   Social History  . Marital Status: Divorced    Spouse Name: N/A    Number of Children: N/A  . Years of Education: N/A   Occupational History  . Not on file.   Social History Main Topics  . Smoking status: Former Smoker -- 2.00 packs/day for 55 years    Quit date: 12/09/2011  . Smokeless tobacco: Never Used     Comment: Patient  has not somked since midnight on wednesday.   . Alcohol Use: No  . Drug Use: No  . Sexual Activity: Not on file   Other Topics Concern  . Not on file   Social History Narrative  . No narrative on file     Filed Vitals:   03/17/13 1449 03/17/13 1506  BP: 211/120 196/99  Pulse: 84 83  Height: 5\' 3"  (1.6 m)   Weight: 178 lb (80.74 kg)   SpO2: 97% 98%    PHYSICAL EXAM General: NAD Neck: No JVD, no thyromegaly or thyroid nodule.  Lungs: Prolonged expiratory phase with faint expiratory wheezes with poor effort CV: Nondisplaced PMI.  Heart regular but distant S1/S2, no S3/S4, no murmur.  No peripheral edema.  No carotid bruit.  Normal pedal pulses.  Abdomen: Soft, nontender, no hepatosplenomegaly, no distention.  Neurologic: Alert and oriented x 3.  Psych: Normal affect. Extremities: No clubbing or cyanosis.   ECG: reviewed and available in electronic records.      ASSESSMENT AND PLAN: 1. Dyspnea on exertion: this is likely being caused by his poorly controlled COPD, after several decades of tobacco abuse. I encouraged him to go see his pulmonologist so that his meds could be titrated appropriately.  2. Malignant HTN: I will increase his Lisinopril/HCTZ to 20/25 mg q am and add Lisinopril 20 mg q pm, and check a BMP to assess his renal function in one week. I will have him return for a nurse visit to reassess his BP in 2 weeks. If it is still uncontrolled, I would add amlodipine 5 mg daily.  3. Hyperlipidemia: LDL elevated at 111, so I will increase Simvastatin to 20 mg daily and recheck lipids and LFT's in 4 months.  4. CAD: stable, continue ASA, beta blocker, IMdur, and Plavix.  5. Carotid artery Doppler: surveillance Dopplers.    Prentice Docker, M.D., F.A.C.C.

## 2013-03-19 ENCOUNTER — Encounter (HOSPITAL_COMMUNITY): Payer: Medicare Other

## 2013-03-24 ENCOUNTER — Encounter (HOSPITAL_COMMUNITY): Payer: Medicare Other

## 2013-03-24 ENCOUNTER — Other Ambulatory Visit: Payer: Self-pay | Admitting: Cardiovascular Disease

## 2013-03-24 LAB — BASIC METABOLIC PANEL
BUN: 21 mg/dL (ref 6–23)
Chloride: 99 mEq/L (ref 96–112)
Creat: 1.1 mg/dL (ref 0.50–1.35)

## 2013-03-26 ENCOUNTER — Encounter (HOSPITAL_COMMUNITY): Payer: Medicare Other

## 2013-03-31 ENCOUNTER — Encounter (HOSPITAL_COMMUNITY): Payer: Medicare Other

## 2013-03-31 ENCOUNTER — Ambulatory Visit: Payer: Medicare Other | Admitting: *Deleted

## 2013-03-31 VITALS — BP 147/78 | HR 87 | Ht 63.0 in | Wt 178.0 lb

## 2013-03-31 DIAGNOSIS — I10 Essential (primary) hypertension: Secondary | ICD-10-CM

## 2013-03-31 NOTE — Progress Notes (Signed)
Patients stated that he is not experiencing no chest pain or N&V. He is a little light headed / dizzy.

## 2013-04-01 ENCOUNTER — Ambulatory Visit: Payer: Self-pay | Admitting: Family

## 2013-04-01 ENCOUNTER — Encounter (INDEPENDENT_AMBULATORY_CARE_PROVIDER_SITE_OTHER): Payer: Medicare Other | Admitting: *Deleted

## 2013-04-01 DIAGNOSIS — Z48812 Encounter for surgical aftercare following surgery on the circulatory system: Secondary | ICD-10-CM

## 2013-04-01 DIAGNOSIS — I739 Peripheral vascular disease, unspecified: Secondary | ICD-10-CM

## 2013-04-01 MED ORDER — AMLODIPINE BESYLATE 5 MG PO TABS: 5.0000 mg | ORAL_TABLET | Freq: Every day | ORAL | Status: DC

## 2013-04-01 NOTE — Progress Notes (Signed)
Patient notified.  New rx sent to [facility].

## 2013-04-01 NOTE — Progress Notes (Signed)
Laqueta Linden, MD     Sent: Mon March 31, 2013  8:46 AM      To: Tom Chris, LPN        Message      Start amlodipine 5 mg daily. Thanks.

## 2013-04-02 ENCOUNTER — Encounter (HOSPITAL_COMMUNITY): Payer: Medicare Other

## 2013-04-02 ENCOUNTER — Other Ambulatory Visit: Payer: Self-pay | Admitting: *Deleted

## 2013-04-02 DIAGNOSIS — I739 Peripheral vascular disease, unspecified: Secondary | ICD-10-CM

## 2013-04-02 DIAGNOSIS — Z48812 Encounter for surgical aftercare following surgery on the circulatory system: Secondary | ICD-10-CM

## 2013-04-03 ENCOUNTER — Encounter: Payer: Self-pay | Admitting: Vascular Surgery

## 2013-04-07 ENCOUNTER — Encounter: Payer: Self-pay | Admitting: *Deleted

## 2013-04-07 ENCOUNTER — Encounter (HOSPITAL_COMMUNITY): Payer: Medicare Other

## 2013-04-07 ENCOUNTER — Telehealth: Payer: Self-pay | Admitting: *Deleted

## 2013-04-07 DIAGNOSIS — E876 Hypokalemia: Secondary | ICD-10-CM

## 2013-04-07 DIAGNOSIS — Z79899 Other long term (current) drug therapy: Secondary | ICD-10-CM

## 2013-04-07 MED ORDER — LISINOPRIL 20 MG PO TABS: 20.0000 mg | ORAL_TABLET | Freq: Two times a day (BID) | ORAL | Status: DC

## 2013-04-07 MED ORDER — HYDROCHLOROTHIAZIDE 12.5 MG PO CAPS: 12.5000 mg | ORAL_CAPSULE | Freq: Every day | ORAL | Status: DC

## 2013-04-07 MED ORDER — POTASSIUM CHLORIDE CRYS ER 20 MEQ PO TBCR: EXTENDED_RELEASE_TABLET | ORAL | Status: DC

## 2013-04-07 NOTE — Telephone Encounter (Signed)
Message copied by Lesle Chris on Mon Apr 07, 2013  3:43 PM ------      Message from: Prentice Docker A      Created: Thu Apr 03, 2013 11:31 AM       Would give separate prescriptions for each (lisinopril and hctz). Thanks.            ----- Message -----         From: Lesle Chris, LPN         Sent: 04/02/2013   5:01 PM           To: Laqueta Linden, MD            Please clarify below.  Patient does not have HCTZ alone.  He has Lisinopril/HCTZ combination.  His grand-daughter helps with his meds, he will have her call tomorrow for this information.       ------

## 2013-04-07 NOTE — Telephone Encounter (Signed)
Notes Recorded by Lesle Chris, LPN on 9/60/4540 at 3:41 PM Received return call from grand-daughter (Summer). Informed her of all medication changes. Will send rx for HCTZ 12.5mg  daily & KCl daily x 3 days only to [facility]. Will mail lab order - to be done around 04/14/2013.  Notes Recorded by Lesle Chris, LPN on 9/81/1914 at 11:20 AM Spoke with patient this morning, stated he forgot to have her call. Thinks she gets off at 1 today & will have her call.  Notes Recorded by Lesle Chris, LPN on 7/82/9562 at 5:01 PM Please clarify below. Patient does not have HCTZ alone. He has Lisinopril/HCTZ combination. His grand-daughter helps with his meds, he will have her call tomorrow for this information.

## 2013-04-09 ENCOUNTER — Encounter (HOSPITAL_COMMUNITY): Payer: Medicare Other

## 2013-04-14 ENCOUNTER — Encounter (HOSPITAL_COMMUNITY): Payer: Medicare Other

## 2013-04-16 ENCOUNTER — Encounter (HOSPITAL_COMMUNITY): Payer: Medicare Other

## 2013-04-21 ENCOUNTER — Encounter (HOSPITAL_COMMUNITY): Payer: Medicare Other

## 2013-04-23 ENCOUNTER — Encounter (HOSPITAL_COMMUNITY): Payer: Medicare Other

## 2013-04-28 ENCOUNTER — Encounter (HOSPITAL_COMMUNITY): Payer: Medicare Other

## 2013-04-30 ENCOUNTER — Encounter (HOSPITAL_COMMUNITY): Payer: Medicare Other

## 2013-05-05 ENCOUNTER — Encounter (HOSPITAL_COMMUNITY): Payer: Medicare Other

## 2013-05-07 ENCOUNTER — Encounter (HOSPITAL_COMMUNITY): Payer: Medicare Other

## 2013-05-12 ENCOUNTER — Encounter (HOSPITAL_COMMUNITY): Payer: Medicare Other

## 2013-05-14 ENCOUNTER — Encounter (HOSPITAL_COMMUNITY): Payer: Medicare Other

## 2013-05-16 ENCOUNTER — Telehealth: Payer: Self-pay | Admitting: *Deleted

## 2013-05-16 NOTE — Telephone Encounter (Signed)
BP under much better control. Continue present therapy.

## 2013-05-16 NOTE — Telephone Encounter (Signed)
Message copied by Lesle Chris on Fri May 16, 2013  1:46 PM ------      Message from: Lesle Chris      Created: Wed Apr 16, 2013  9:28 AM      Regarding: RE: KO FU       Not done yet 04/16/2013             ----- Message -----         From: Lesle Chris, LPN         Sent: 04/16/2013           To: Lesle Chris, LPN      Subject: KO FU                                                    BMET & PHONE CALL FROM GRAND-DAUGHTER ON BP - DUE 04/14/2013       ------

## 2013-05-16 NOTE — Telephone Encounter (Signed)
Spoke with patient on 05/14/2013 - 12:24 >  Stated he had not done his lab work yet, had forgotten.  Lab orders mailed at patient request.  Also, stated that his grand-daughter (Summer) has been keeping up with his blood pressure reading.  Stated he was not currently at home, but will have Summer call us with numbers.    Call returned by grand-daughter (Summer) - see reading below (usually checked around breakfast time).  9/29 - 136/73  81 9/30 - 121/83  109 10/1 - 119/70  90 10/4 - 145/78  88 10/6 - 133/73  92  TODAY (11/7) - 151/82  89

## 2013-05-19 ENCOUNTER — Encounter (HOSPITAL_COMMUNITY): Payer: Medicare Other

## 2013-05-21 ENCOUNTER — Encounter (HOSPITAL_COMMUNITY): Payer: Medicare Other

## 2013-05-21 ENCOUNTER — Other Ambulatory Visit: Payer: Self-pay | Admitting: Cardiovascular Disease

## 2013-05-21 LAB — BASIC METABOLIC PANEL
BUN: 21 mg/dL (ref 6–23)
Potassium: 3.4 mEq/L — ABNORMAL LOW (ref 3.5–5.3)
Sodium: 143 mEq/L (ref 135–145)

## 2013-05-21 NOTE — Telephone Encounter (Signed)
Patient notified

## 2013-05-26 ENCOUNTER — Encounter (HOSPITAL_COMMUNITY): Payer: Medicare Other

## 2013-05-28 ENCOUNTER — Encounter (HOSPITAL_COMMUNITY): Payer: Medicare Other

## 2013-06-02 ENCOUNTER — Encounter (HOSPITAL_COMMUNITY): Payer: Medicare Other

## 2013-06-04 ENCOUNTER — Encounter (HOSPITAL_COMMUNITY): Payer: Medicare Other

## 2013-06-10 ENCOUNTER — Encounter: Payer: Self-pay | Admitting: *Deleted

## 2013-06-10 ENCOUNTER — Telehealth: Payer: Self-pay | Admitting: *Deleted

## 2013-06-10 DIAGNOSIS — E876 Hypokalemia: Secondary | ICD-10-CM

## 2013-06-10 DIAGNOSIS — Z79899 Other long term (current) drug therapy: Secondary | ICD-10-CM

## 2013-06-10 NOTE — Telephone Encounter (Signed)
Message copied by Lesle Chris on Tue Jun 10, 2013  1:59 PM ------      Message from: Thompson Grayer      Created: Wed May 21, 2013  5:32 PM                   ----- Message -----         From: Laqueta Linden, MD         Sent: 05/21/2013   5:21 PM           To: Thompson Grayer, LPN            Recommend 20 meq KCl daily, with repeat BMET in one week. ------

## 2013-06-10 NOTE — Telephone Encounter (Signed)
Notes Recorded by Lesle Chris, LPN on 16/07/958 at 1:58 PM No return call from grand-daughter (Summer). Will mail letter & order today.  Notes Recorded by Lesle Chris, LPN on 45/40/9811 at 1:28 PM Patient notified. Thinks he is already taking daily, but not sure since his grand-daughter (Summer) takes care of his medications. He will have her call back to confirm.

## 2013-07-23 ENCOUNTER — Telehealth: Payer: Self-pay | Admitting: *Deleted

## 2013-07-23 ENCOUNTER — Encounter: Payer: Self-pay | Admitting: *Deleted

## 2013-07-23 DIAGNOSIS — I2510 Atherosclerotic heart disease of native coronary artery without angina pectoris: Secondary | ICD-10-CM

## 2013-07-23 DIAGNOSIS — E785 Hyperlipidemia, unspecified: Secondary | ICD-10-CM

## 2013-07-23 DIAGNOSIS — Z79899 Other long term (current) drug therapy: Secondary | ICD-10-CM

## 2013-07-23 NOTE — Telephone Encounter (Signed)
Message copied by Lesle ChrisHILL, ANGELA G on Wed Jul 23, 2013  2:10 PM ------      Message from: Lesle ChrisHILL, ANGELA G      Created: Mon Mar 17, 2013  3:57 PM       FLP, LFT  ------

## 2013-07-23 NOTE — Telephone Encounter (Signed)
Letter & orders mailed today.  

## 2013-08-01 ENCOUNTER — Other Ambulatory Visit: Payer: Self-pay | Admitting: Cardiovascular Disease

## 2013-08-01 LAB — HEPATIC FUNCTION PANEL
ALBUMIN: 4.1 g/dL (ref 3.5–5.2)
ALT: 24 U/L (ref 0–53)
AST: 24 U/L (ref 0–37)
Alkaline Phosphatase: 28 U/L — ABNORMAL LOW (ref 39–117)
BILIRUBIN TOTAL: 0.6 mg/dL (ref 0.3–1.2)
Bilirubin, Direct: 0.1 mg/dL (ref 0.0–0.3)
Indirect Bilirubin: 0.5 mg/dL (ref 0.0–0.9)
Total Protein: 6.9 g/dL (ref 6.0–8.3)

## 2013-08-01 LAB — LIPID PANEL
Cholesterol: 102 mg/dL (ref 0–200)
HDL: 37 mg/dL — AB (ref >39)
LDL Cholesterol: 42 mg/dL (ref 0–99)
TRIGLYCERIDES: 116 mg/dL (ref <150)
Total CHOL/HDL Ratio: 2.8 Ratio
VLDL: 23 mg/dL (ref 0–40)

## 2013-08-04 ENCOUNTER — Encounter: Payer: Self-pay | Admitting: *Deleted

## 2013-08-08 NOTE — Addendum Note (Signed)
Encounter addended by: Angelica PouNancy L Marlinda Miranda, RN on: 08/08/2013  7:16 AM<BR>     Documentation filed: Notes Section

## 2013-08-08 NOTE — Addendum Note (Signed)
Encounter addended by: Angelica PouNancy L Keifer Habib, RN on: 08/08/2013  7:20 AM<BR>     Documentation filed: Clinical Notes

## 2013-08-08 NOTE — Progress Notes (Signed)
Pulmonary Rehabilitation Program Outcomes Report   Orientation:  01/30/2013 Graduate Date:  NA Discharge Date:  02/19/2013 # of sessions completed: 3 ZO:XWRU/EAVX:COPD/CAD  Cardiologist: Error and Pulmonologist: Paschal Family MD:  Francene CastleShah Class Time:  13:00  A.  Exercise Program:  Tolerates exercise @ 3.54 METS for 15 minutes and Walk Test Results:  Pre: Walk Test PRE: rest HR 76, BP 122/70, 02 97%, RPE 7 and RPD 7, 6 min HR 84,BP 130/80, O2 89% RPE 9 and RPD 13, Post HR 78 , BP 124/82, O2 100% RPE 7 and RPD 7 walked 800 ft at 1.5 mph at 2.2 METS.  B.  Mental Health:  Good mental attitude  C.  Education/Instruction/Skills  Accurately checks own pulse.  Rest:  89  Exercise:  107, Knows THR for exercise, Uses Perceived Exertion Scale and/or Dyspnea Scale and Attended 1 education classes  Demonstrates accurate pursed lip breathing  D.  Nutrition/Weight Control/Body Composition:  Adherence to prescribed nutrition program: good    E.  Blood Lipids    Lab Results  Component Value Date   CHOL 102 08/01/2013   HDL 37* 08/01/2013   LDLCALC 42 08/01/2013   TRIG 116 08/01/2013   CHOLHDL 2.8 08/01/2013    F.  Lifestyle Changes:  Making positive lifestyle changes and Not smoking:  Quit 2013  G.  Symptoms noted with exercise:  Asymptomatic  Report Completed By:  Lelon HuhNancy L. Maleya Leever RN   Comments:  This is patients discharge report. He only attended 3 sessions due to SOB while on [employer]. He never returned to finish rehab. His resting HR was 89 and Rest BP was 142/70 Peak HR was 107 and peak BP was 158/80.

## 2013-09-02 ENCOUNTER — Other Ambulatory Visit: Payer: Self-pay | Admitting: Vascular Surgery

## 2013-09-02 DIAGNOSIS — I739 Peripheral vascular disease, unspecified: Secondary | ICD-10-CM

## 2013-09-02 DIAGNOSIS — Z48812 Encounter for surgical aftercare following surgery on the circulatory system: Secondary | ICD-10-CM

## 2013-10-07 ENCOUNTER — Encounter: Payer: Self-pay | Admitting: Cardiovascular Disease

## 2013-10-07 ENCOUNTER — Ambulatory Visit (INDEPENDENT_AMBULATORY_CARE_PROVIDER_SITE_OTHER): Payer: Medicare Other | Admitting: Cardiovascular Disease

## 2013-10-07 VITALS — BP 156/78 | HR 82 | Ht 63.0 in | Wt 183.0 lb

## 2013-10-07 DIAGNOSIS — R0602 Shortness of breath: Secondary | ICD-10-CM

## 2013-10-07 DIAGNOSIS — I10 Essential (primary) hypertension: Secondary | ICD-10-CM

## 2013-10-07 DIAGNOSIS — I739 Peripheral vascular disease, unspecified: Secondary | ICD-10-CM

## 2013-10-07 DIAGNOSIS — I779 Disorder of arteries and arterioles, unspecified: Secondary | ICD-10-CM

## 2013-10-07 DIAGNOSIS — I2510 Atherosclerotic heart disease of native coronary artery without angina pectoris: Secondary | ICD-10-CM

## 2013-10-07 DIAGNOSIS — E785 Hyperlipidemia, unspecified: Secondary | ICD-10-CM

## 2013-10-07 MED ORDER — AMLODIPINE BESYLATE 10 MG PO TABS: 10.0000 mg | ORAL_TABLET | Freq: Every day | ORAL | Status: DC

## 2013-10-07 NOTE — Patient Instructions (Signed)
   Increase Amlodipine to 10mg  daily - new sent to pharm (you may take 2 of your 5mg  tabs till finish current supply) Continue all other medications.   Your physician wants you to follow up in: 6 months.  You will receive a reminder letter in the mail one-two months in advance.  If you don't receive a letter, please call our office to schedule the follow up appointment

## 2013-10-07 NOTE — Progress Notes (Signed)
Patient ID: [name], male   DOB: Nov 25, 1945, 68 y.o.   MRN: [id number]      SUBJECTIVE: The patient is a 68 year old male here to followup for coronary artery disease, malignant hypertension, and hyperlipidemia, as well as carotid artery disease. He also has a long-standing history of tobacco abuse and COPD, and chronic dyspnea related to this. He isn't certain if his inhaler helps him. His most recent lipid panel was in 08/01/2013 which revealed a total cholesterol of 102, triglycerides 116, HDL 37, LDL 42. AST and ALT were 24. He denies chest pain, lightheadedness, leg swelling and syncope. He very seldom has palpitations.  No Known Allergies  Current Outpatient Prescriptions  Medication Sig Dispense Refill  . albuterol (PROAIR HFA) 108 (90 BASE) MCG/ACT inhaler Inhale 2 puffs into the lungs every 6 (six) hours as needed.      Marland Kitchen. albuterol (PROVENTIL) (2.5 MG/3ML) 0.083% nebulizer solution Take 2.5 mg by nebulization every 6 (six) hours as needed.      Marland Kitchen. amLODipine (NORVASC) 5 MG tablet Take 1 tablet (5 mg total) by mouth daily.  30 tablet  6  . aspirin EC 81 MG tablet Take 1 tablet (81 mg total) by mouth daily.      . clopidogrel (PLAVIX) 75 MG tablet Take 75 mg by mouth daily.      . fluticasone (FLONASE) 50 MCG/ACT nasal spray Place 1 spray into the nose.       Marland Kitchen. Fluticasone-Salmeterol (ADVAIR DISKUS) 500-50 MCG/DOSE AEPB Inhale 1 puff into the lungs every 12 (twelve) hours.      . hydrochlorothiazide (MICROZIDE) 12.5 MG capsule Take 1 capsule (12.5 mg total) by mouth daily.  30 capsule  6  . isosorbide mononitrate (IMDUR) 30 MG 24 hr tablet Take 30 mg by mouth daily.      Marland Kitchen. lisinopril (PRINIVIL,ZESTRIL) 20 MG tablet Take 1 tablet (20 mg total) by mouth 2 (two) times daily.      . metoprolol succinate (TOPROL-XL) 50 MG 24 hr tablet Take 50 mg by mouth daily. Take with or immediately following a meal.      . nitroGLYCERIN (NITROSTAT) 0.4 MG SL tablet Place 1 tablet (0.4 mg total)  under the tongue every 5 (five) minutes as needed for chest pain.  25 tablet  3  . potassium chloride SA (K-DUR,KLOR-CON) 20 MEQ tablet Take one tablet daily x 3 days only, then only as instructed by MD thereafter.  30 tablet  1  . simvastatin (ZOCOR) 20 MG tablet Take 1 tablet (20 mg total) by mouth at bedtime.  30 tablet  6  . tiotropium (SPIRIVA) 18 MCG inhalation capsule Place 18 mcg into inhaler and inhale daily.       No current facility-administered medications for this visit.    Past Medical History  Diagnosis Date  . COPD (chronic obstructive pulmonary disease)   . Essential hypertension, benign   . Peripheral arterial disease   . NSTEMI (non-ST elevated myocardial infarction)     12/12  . Carotid artery disease     50-69% RICA and less than 50% LICA, 07/2011  . Coronary atherosclerosis of native coronary artery     Based on abnormal Myoview - inferior scar, 05/2011; LVEF 45-50%, echo, 07/2011  . Tobacco abuse   . HLD (hyperlipidemia)     Past Surgical History  Procedure Laterality Date  . Right hydrocele repair    . Right axillary to femoral bypass  3/12    Dr. Hart RochesterLawson  .  Right to left femoral to femoral bypass  3/12    Dr. Hart Rochester  . Sigmoid resection / rectopexy  2005    History   Social History  . Marital Status: Divorced    Spouse Name: N/A    Number of Children: N/A  . Years of Education: N/A   Occupational History  . Not on file.   Social History Main Topics  . Smoking status: Former Smoker -- 2.00 packs/day for 55 years    Quit date: 12/09/2011  . Smokeless tobacco: Never Used     Comment: Patient has not somked since midnight on wednesday.   . Alcohol Use: No  . Drug Use: No  . Sexual Activity: Not on file   Other Topics Concern  . Not on file   Social History Narrative  . No narrative on file     Filed Vitals:   10/07/13 1135  BP: 156/78  Pulse: 82  Height: 5\' 3"  (1.6 m)  Weight: 183 lb (83.008 kg)  SpO2: 97%    PHYSICAL  EXAM General: NAD Neck: No JVD, no thyromegaly. Lungs: Faint expiratory rhonchi bilaterally with normal respiratory effort. CV: Nondisplaced PMI.  Distant tones, but regular rate and rhythm, normal S1/S2, no S3/S4, no murmur. No pretibial or periankle edema.  No carotid bruit.  Normal pedal pulses.  Abdomen: Soft, nontender, no hepatosplenomegaly, no distention.  Neurologic: Alert and oriented x 3.  Psych: Normal affect. Extremities: No clubbing or cyanosis.   ECG: reviewed and available in electronic records.      ASSESSMENT AND PLAN: 1. Dyspnea on exertion: chronic and related to his COPD. 2. Malignant HTN: Elevated today. I will increase amlodipine to 10 mg daily.  3. Hyperlipidemia: well controlled with results noted above dated 08/01/13. Continue current therapy. 4. CAD: Symptomatically stable. Continue ASA, beta blocker, Imdur, and Plavix. Echocardiogram from September 2014 showed normal left ventricular systolic function, EF 60-65%, and mild to moderate LVH. Regional wall motion was difficult to assess. 5. Carotid artery Doppler: follows up with [facility] in [place].  Dispo: f/u 6 months.  Prentice Docker, M.D., F.A.C.C.

## 2013-10-16 ENCOUNTER — Other Ambulatory Visit: Payer: Self-pay | Admitting: Cardiovascular Disease

## 2013-11-20 ENCOUNTER — Other Ambulatory Visit: Payer: Self-pay | Admitting: Cardiovascular Disease

## 2013-12-29 ENCOUNTER — Encounter (HOSPITAL_COMMUNITY): Payer: Self-pay | Admitting: Emergency Medicine

## 2013-12-29 ENCOUNTER — Telehealth: Payer: Self-pay | Admitting: Cardiovascular Disease

## 2013-12-29 ENCOUNTER — Observation Stay (HOSPITAL_COMMUNITY)
Admission: EM | Admit: 2013-12-29 | Discharge: 2013-12-30 | Disposition: A | Discharge status: Home / Self Care | Payer: PRIVATE HEALTH INSURANCE | Attending: Internal Medicine | Admitting: Internal Medicine

## 2013-12-29 ENCOUNTER — Emergency Department (HOSPITAL_COMMUNITY): Payer: PRIVATE HEALTH INSURANCE

## 2013-12-29 DIAGNOSIS — J4489 Other specified chronic obstructive pulmonary disease: Secondary | ICD-10-CM | POA: Insufficient documentation

## 2013-12-29 DIAGNOSIS — J9610 Chronic respiratory failure, unspecified whether with hypoxia or hypercapnia: Secondary | ICD-10-CM | POA: Insufficient documentation

## 2013-12-29 DIAGNOSIS — Z87891 Personal history of nicotine dependence: Secondary | ICD-10-CM | POA: Diagnosis not present

## 2013-12-29 DIAGNOSIS — I779 Disorder of arteries and arterioles, unspecified: Secondary | ICD-10-CM | POA: Diagnosis not present

## 2013-12-29 DIAGNOSIS — I517 Cardiomegaly: Secondary | ICD-10-CM

## 2013-12-29 DIAGNOSIS — E876 Hypokalemia: Secondary | ICD-10-CM | POA: Insufficient documentation

## 2013-12-29 DIAGNOSIS — Z79899 Other long term (current) drug therapy: Secondary | ICD-10-CM | POA: Diagnosis not present

## 2013-12-29 DIAGNOSIS — R9439 Abnormal result of other cardiovascular function study: Secondary | ICD-10-CM | POA: Diagnosis not present

## 2013-12-29 DIAGNOSIS — Z9981 Dependence on supplemental oxygen: Secondary | ICD-10-CM | POA: Insufficient documentation

## 2013-12-29 DIAGNOSIS — I739 Peripheral vascular disease, unspecified: Secondary | ICD-10-CM | POA: Insufficient documentation

## 2013-12-29 DIAGNOSIS — I10 Essential (primary) hypertension: Secondary | ICD-10-CM | POA: Insufficient documentation

## 2013-12-29 DIAGNOSIS — Z7982 Long term (current) use of aspirin: Secondary | ICD-10-CM | POA: Insufficient documentation

## 2013-12-29 DIAGNOSIS — Z23 Encounter for immunization: Secondary | ICD-10-CM | POA: Diagnosis not present

## 2013-12-29 DIAGNOSIS — I25119 Atherosclerotic heart disease of native coronary artery with unspecified angina pectoris: Secondary | ICD-10-CM

## 2013-12-29 DIAGNOSIS — I200 Unstable angina: Secondary | ICD-10-CM | POA: Diagnosis not present

## 2013-12-29 DIAGNOSIS — R079 Chest pain, unspecified: Secondary | ICD-10-CM | POA: Diagnosis present

## 2013-12-29 DIAGNOSIS — J9611 Chronic respiratory failure with hypoxia: Secondary | ICD-10-CM

## 2013-12-29 DIAGNOSIS — J449 Chronic obstructive pulmonary disease, unspecified: Secondary | ICD-10-CM | POA: Insufficient documentation

## 2013-12-29 DIAGNOSIS — E785 Hyperlipidemia, unspecified: Secondary | ICD-10-CM | POA: Diagnosis not present

## 2013-12-29 DIAGNOSIS — R0600 Dyspnea, unspecified: Secondary | ICD-10-CM

## 2013-12-29 DIAGNOSIS — I252 Old myocardial infarction: Secondary | ICD-10-CM | POA: Diagnosis not present

## 2013-12-29 DIAGNOSIS — J441 Chronic obstructive pulmonary disease with (acute) exacerbation: Secondary | ICD-10-CM

## 2013-12-29 DIAGNOSIS — D72829 Elevated white blood cell count, unspecified: Secondary | ICD-10-CM | POA: Insufficient documentation

## 2013-12-29 DIAGNOSIS — R739 Hyperglycemia, unspecified: Secondary | ICD-10-CM | POA: Diagnosis present

## 2013-12-29 DIAGNOSIS — R7309 Other abnormal glucose: Secondary | ICD-10-CM | POA: Insufficient documentation

## 2013-12-29 DIAGNOSIS — E782 Mixed hyperlipidemia: Secondary | ICD-10-CM

## 2013-12-29 DIAGNOSIS — Z720 Tobacco use: Secondary | ICD-10-CM | POA: Diagnosis present

## 2013-12-29 DIAGNOSIS — I2510 Atherosclerotic heart disease of native coronary artery without angina pectoris: Secondary | ICD-10-CM | POA: Insufficient documentation

## 2013-12-29 DIAGNOSIS — R0902 Hypoxemia: Secondary | ICD-10-CM

## 2013-12-29 LAB — APTT: aPTT: 35 seconds (ref 24–37)

## 2013-12-29 LAB — CBC WITH DIFFERENTIAL/PLATELET
Basophils Absolute: 0.1 10*3/uL (ref 0.0–0.1)
Basophils Relative: 0 % (ref 0–1)
EOS ABS: 0.1 10*3/uL (ref 0.0–0.7)
EOS PCT: 1 % (ref 0–5)
HCT: 38 % — ABNORMAL LOW (ref 39.0–52.0)
Hemoglobin: 12.9 g/dL — ABNORMAL LOW (ref 13.0–17.0)
Lymphocytes Relative: 6 % — ABNORMAL LOW (ref 12–46)
Lymphs Abs: 0.9 10*3/uL (ref 0.7–4.0)
MCH: 29.9 pg (ref 26.0–34.0)
MCHC: 33.9 g/dL (ref 30.0–36.0)
MCV: 88.2 fL (ref 78.0–100.0)
Monocytes Absolute: 1 10*3/uL (ref 0.1–1.0)
Monocytes Relative: 7 % (ref 3–12)
Neutro Abs: 13.4 10*3/uL — ABNORMAL HIGH (ref 1.7–7.7)
Neutrophils Relative %: 86 % — ABNORMAL HIGH (ref 43–77)
PLATELETS: 196 10*3/uL (ref 150–400)
RBC: 4.31 MIL/uL (ref 4.22–5.81)
RDW: 15.5 % (ref 11.5–15.5)
WBC: 15.4 10*3/uL — AB (ref 4.0–10.5)

## 2013-12-29 LAB — HEPATIC FUNCTION PANEL
ALT: 21 U/L (ref 0–53)
AST: 22 U/L (ref 0–37)
Albumin: 3.3 g/dL — ABNORMAL LOW (ref 3.5–5.2)
Alkaline Phosphatase: 27 U/L — ABNORMAL LOW (ref 39–117)
BILIRUBIN TOTAL: 0.7 mg/dL (ref 0.3–1.2)
Bilirubin, Direct: 0.2 mg/dL (ref 0.0–0.3)
Indirect Bilirubin: 0.5 mg/dL (ref 0.3–0.9)
TOTAL PROTEIN: 6.8 g/dL (ref 6.0–8.3)

## 2013-12-29 LAB — LIPID PANEL
CHOLESTEROL: 120 mg/dL (ref 0–200)
HDL: 47 mg/dL (ref >39)
LDL Cholesterol: 57 mg/dL (ref 0–99)
Total CHOL/HDL Ratio: 2.6 RATIO
Triglycerides: 79 mg/dL (ref <150)
VLDL: 16 mg/dL (ref 0–40)

## 2013-12-29 LAB — BASIC METABOLIC PANEL
BUN: 19 mg/dL (ref 6–23)
CALCIUM: 9 mg/dL (ref 8.4–10.5)
CO2: 30 mEq/L (ref 19–32)
Chloride: 98 mEq/L (ref 96–112)
Creatinine, Ser: 0.95 mg/dL (ref 0.50–1.35)
GFR calc Af Amer: >90 mL/min (ref >90)
GFR, EST NON AFRICAN AMERICAN: 83 mL/min — AB (ref >90)
Glucose, Bld: 149 mg/dL — ABNORMAL HIGH (ref 70–99)
Potassium: 3.4 mEq/L — ABNORMAL LOW (ref 3.7–5.3)
SODIUM: 142 meq/L (ref 137–147)

## 2013-12-29 LAB — PROTIME-INR
INR: 1.13 (ref 0.00–1.49)
PROTHROMBIN TIME: 14.3 s (ref 11.6–15.2)

## 2013-12-29 LAB — HEPARIN LEVEL (UNFRACTIONATED)
Heparin Unfractionated: <0.1 IU/mL — ABNORMAL LOW (ref 0.30–0.70)
Heparin Unfractionated: <0.1 IU/mL — ABNORMAL LOW (ref 0.30–0.70)

## 2013-12-29 LAB — TROPONIN I

## 2013-12-29 LAB — MRSA PCR SCREENING: MRSA by PCR: POSITIVE — AB

## 2013-12-29 MED ORDER — ALBUTEROL SULFATE (2.5 MG/3ML) 0.083% IN NEBU: 2.5000 mg | INHALATION_SOLUTION | Freq: Four times a day (QID) | RESPIRATORY_TRACT | Status: DC | PRN

## 2013-12-29 MED ORDER — SODIUM CHLORIDE 0.9 % IV SOLN
INTRAVENOUS | Status: DC
  Administered 2013-12-29 – 2013-12-30 (×2): via INTRAVENOUS

## 2013-12-29 MED ORDER — ALBUTEROL SULFATE (2.5 MG/3ML) 0.083% IN NEBU
2.5000 mg | INHALATION_SOLUTION | Freq: Four times a day (QID) | RESPIRATORY_TRACT | Status: DC | PRN
  Filled 2013-12-29: qty 3

## 2013-12-29 MED ORDER — BISACODYL 10 MG RE SUPP: 10.0000 mg | Freq: Every day | RECTAL | Status: DC | PRN

## 2013-12-29 MED ORDER — HEPARIN (PORCINE) IN NACL 100-0.45 UNIT/ML-% IJ SOLN
1300.0000 [IU]/h | INTRAMUSCULAR | Status: DC
  Administered 2013-12-29: 800 [IU]/h via INTRAVENOUS
  Administered 2013-12-30: 1300 [IU]/h via INTRAVENOUS
  Filled 2013-12-29 (×2): qty 250

## 2013-12-29 MED ORDER — TIOTROPIUM BROMIDE MONOHYDRATE 18 MCG IN CAPS
18.0000 ug | ORAL_CAPSULE | Freq: Every day | RESPIRATORY_TRACT | Status: DC
  Filled 2013-12-29: qty 5

## 2013-12-29 MED ORDER — PNEUMOCOCCAL VAC POLYVALENT 25 MCG/0.5ML IJ INJ
0.5000 mL | INJECTION | INTRAMUSCULAR | Status: AC
Start: 2013-12-30 — End: 2013-12-30
  Administered 2013-12-30: 0.5 mL via INTRAMUSCULAR
  Filled 2013-12-29: qty 0.5

## 2013-12-29 MED ORDER — ONDANSETRON HCL 4 MG/2ML IJ SOLN: 4.0000 mg | Freq: Four times a day (QID) | INTRAMUSCULAR | Status: DC | PRN

## 2013-12-29 MED ORDER — MUPIROCIN 2 % EX OINT
1.0000 "application " | TOPICAL_OINTMENT | Freq: Two times a day (BID) | CUTANEOUS | Status: DC
  Administered 2013-12-29 – 2013-12-30 (×2): 1 via NASAL
  Filled 2013-12-29: qty 22

## 2013-12-29 MED ORDER — MORPHINE SULFATE 2 MG/ML IJ SOLN: 1.0000 mg | INTRAMUSCULAR | Status: DC | PRN

## 2013-12-29 MED ORDER — PREDNISONE 20 MG PO TABS
50.0000 mg | ORAL_TABLET | Freq: Every day | ORAL | Status: DC
  Administered 2013-12-29 – 2013-12-30 (×2): 50 mg via ORAL
  Filled 2013-12-29: qty 1
  Filled 2013-12-29: qty 2
  Filled 2013-12-29: qty 1
  Filled 2013-12-29: qty 2

## 2013-12-29 MED ORDER — HEPARIN BOLUS VIA INFUSION
2000.0000 [IU] | Freq: Once | INTRAVENOUS | Status: AC
  Administered 2013-12-29: 2000 [IU] via INTRAVENOUS
  Filled 2013-12-29: qty 2000

## 2013-12-29 MED ORDER — IPRATROPIUM-ALBUTEROL 0.5-2.5 (3) MG/3ML IN SOLN
3.0000 mL | RESPIRATORY_TRACT | Status: DC
  Administered 2013-12-29 – 2013-12-30 (×5): 3 mL via RESPIRATORY_TRACT
  Filled 2013-12-29 (×5): qty 3

## 2013-12-29 MED ORDER — DOCUSATE SODIUM 100 MG PO CAPS
100.0000 mg | ORAL_CAPSULE | Freq: Two times a day (BID) | ORAL | Status: DC
  Filled 2013-12-29: qty 1

## 2013-12-29 MED ORDER — NITROGLYCERIN 0.4 MG SL SUBL: 0.4000 mg | SUBLINGUAL_TABLET | SUBLINGUAL | Status: DC | PRN

## 2013-12-29 MED ORDER — HYDROCODONE-ACETAMINOPHEN 5-325 MG PO TABS: 1.0000 | ORAL_TABLET | ORAL | Status: DC | PRN

## 2013-12-29 MED ORDER — DOBUTAMINE INFUSION FOR EP/ECHO/NUC (1000 MCG/ML)
0.0000 ug/kg/min | INTRAVENOUS | Status: DC
  Filled 2013-12-29: qty 250

## 2013-12-29 MED ORDER — ALUM & MAG HYDROXIDE-SIMETH 200-200-20 MG/5ML PO SUSP
30.0000 mL | Freq: Four times a day (QID) | ORAL | Status: DC | PRN
Start: 2013-12-29 — End: 2013-12-30

## 2013-12-29 MED ORDER — MOMETASONE FURO-FORMOTEROL FUM 200-5 MCG/ACT IN AERO
2.0000 | INHALATION_SPRAY | Freq: Two times a day (BID) | RESPIRATORY_TRACT | Status: DC
  Administered 2013-12-30: 2 via RESPIRATORY_TRACT
  Filled 2013-12-29: qty 8.8

## 2013-12-29 MED ORDER — ACETAMINOPHEN 650 MG RE SUPP: 650.0000 mg | Freq: Four times a day (QID) | RECTAL | Status: DC | PRN

## 2013-12-29 MED ORDER — HEPARIN BOLUS VIA INFUSION
4000.0000 [IU] | Freq: Once | INTRAVENOUS | Status: AC
  Administered 2013-12-29: 4000 [IU] via INTRAVENOUS

## 2013-12-29 MED ORDER — ACETAMINOPHEN 325 MG PO TABS: 650.0000 mg | ORAL_TABLET | Freq: Four times a day (QID) | ORAL | Status: DC | PRN

## 2013-12-29 MED ORDER — CHLORHEXIDINE GLUCONATE CLOTH 2 % EX PADS
6.0000 | MEDICATED_PAD | Freq: Every day | CUTANEOUS | Status: DC
  Administered 2013-12-30: 6 via TOPICAL

## 2013-12-29 MED ORDER — ONDANSETRON HCL 4 MG PO TABS: 4.0000 mg | ORAL_TABLET | Freq: Four times a day (QID) | ORAL | Status: DC | PRN

## 2013-12-29 NOTE — Telephone Encounter (Signed)
Per daughter, patient c/o of having chest pain for several days. Patient has been using his nitroglycerin on each occasion with some relief. Patient denies dizziness or increased sob. Patient c/o coughing a lot. Nurse advised daughter that patient needed to be evaluated at the ED. Daughter verbalized understanding of plan.

## 2013-12-29 NOTE — Progress Notes (Signed)
*  PRELIMINARY RESULTS* Echocardiogram 2D Echocardiogram has been performed.  [name], [name] 12/29/2013, 3:53 PM

## 2013-12-29 NOTE — H&P (Signed)
Patient seen and examined. Note reviewed.  He has been admitted to the hospital with chest pain concerning for unstable angina. He's been admitted to the step down unit and started on intravenous heparin. EKG shows nonspecific inferolateral T wave changes. Cardiology has been consulted and plans on dobutamine stress tests in the morning.  The patient has severe COPD and is currently on 4 L of oxygen. He appears to be actively wheezing. Chest x-ray does not show any acute disease. We'll continue her bronchodilators and start him on oral prednisone.

## 2013-12-29 NOTE — ED Notes (Signed)
ICU RN called and states that she will be going to lunch and will call back for report once she returns.

## 2013-12-29 NOTE — Progress Notes (Signed)
ANTICOAGULATION CONSULT NOTE - Initial Consult  Pharmacy Consult for Heparin Indication: chest pain/ACS  No Known Allergies  Patient Measurements: Height: 5\' 3"  (160 cm) Weight: 185 lb 4.8 oz (84.052 kg) IBW/kg (Calculated) : 56.9  Vital Signs: Temp: 97.6 F (36.4 C) (06/22 0913) Temp src: Oral (06/22 0913) BP: 138/65 mmHg (06/22 0913)  Labs:  Recent Labs  12/29/13 0935  HGB 12.9*  HCT 38.0*  PLT 196  APTT 35  LABPROT 14.3  INR 1.13   Estimated Creatinine Clearance: 74.3 ml/min (by C-G formula based on Cr of 0.9).  Medical History: Past Medical History  Diagnosis Date  . COPD (chronic obstructive pulmonary disease)   . Essential hypertension, benign   . Peripheral arterial disease   . NSTEMI (non-ST elevated myocardial infarction)     12/12  . Carotid artery disease     50-69% RICA and less than 50% LICA, 07/2011  . Coronary atherosclerosis of native coronary artery     Based on abnormal Myoview - inferior scar, 05/2011; LVEF 45-50%, echo, 07/2011  . Tobacco abuse   . HLD (hyperlipidemia)    Medications:   (Not in a hospital admission)  Assessment: 68yo male admitted with c/o chest pain.  Asked to initiate IV Heparin for ACS.    Goal of Therapy:  Heparin level 0.3-0.7 units/ml Monitor platelets by anticoagulation protocol: Yes   Plan:  Heparin 4000 units IV bolus now Heparin infusion at 12 units/Kg/Hr (based on ADJ BW) Heparin level in 6-8 hours then daily CBC daily while on Heparin  Margo AyeHall, Scott A 12/29/2013,10:15 AM

## 2013-12-29 NOTE — Consult Note (Signed)
CARDIOLOGY CONSULT NOTE   Patient ID: Maretta LosBillie R Flythe MRN: [id number] DOB/AGE: [birth year] 68 y.o.  Admit Date: 12/29/2013 Referring Physician: PTH Primary Physician: [name],ASHISH, MD Consulting Cardiologist: Prentice DockerKoneswaran,Suresh MD Primary Cardiologist: Prentice DockerKoneswaran, Suresh MD [facility]) Reason for Consultation: Recurrent chest pain with known CAD   Clinical Summary [name] is a 68 y.o.male admitted via ER with recurrent chest pain, with known history of hypertension, shortness of breath, non-ST elevation MI in 2013, carotid artery disease, CAD with abnormal Myoview revealing an inferior scar, COPD,  Hx of tobacco abuse, and hyperlipidemia. Patient states he had chest pain on and off all day on Sunday described as sharp on the left side of his chest nonradiating not associated with diaphoresis dizziness nausea or increased shortness of breath. His daughter, who is at bedside, states that she noted she stated that he was more tired than normal, was breathing harder than he usually does, and she became concerned. The patient states that pain would last anywhere from 5-15 minutes and go away on its own. At one point in the afternoon, he took nitroglycerin x3 with resolution of symptoms. However, throughout the night he had ongoing sharp discomfort on the left side, causing him not to sleep well.  He states that on Saturday, he mowed his chart using a riding lawnmower. Did not do any extra exertional work that day. He did not have chest pain associated with a sharp border.  His daughter convinced him to come to the emergency room because of chest discomfort this morning. However he was not having any chest pain when he arrived in the emergency room. On arrival blood pressure was 138/65 heart rate 87 O2 sat 1% on 4 L. He had mild leukocytosis with white blood cells of 15.4, he was not found to be anemic. His LV mildly hypokalemic with potassium of 3.4, and has been complaining of some leg cramps. Glucose was  elevated at 149. Cardiac enzymes are negative x2. Chest x-ray revealed no active disease. EKG reveals normal sinus rhythm with nonspecific inferior lateral ST changes. He was started on heparin drip, Dr. Nishan at [facility] was called to accept patient on transfer for cardiac catheterization. It was his recommendation that we see him and evaluate him here at [place] prior to transfer. He is currently resting comfortably in ICU without recurrence of chest pain. He remains on O2 and is actively wheezing.   No Known Allergies  Medications Scheduled Medications: . docusate sodium  100 mg Oral BID  . mometasone-formoterol  2 puff Inhalation BID  . [START ON 12/30/2013] pneumococcal 23 valent vaccine  0.5 mL Intramuscular Tomorrow-1000  . tiotropium  18 mcg Inhalation Daily    Infusions: . heparin 800 Units/hr (12/29/13 1300)    PRN Medications: acetaminophen, acetaminophen, albuterol, alum & mag hydroxide-simeth, bisacodyl, HYDROcodone-acetaminophen, morphine injection, nitroGLYCERIN, ondansetron (ZOFRAN) IV, ondansetron   Past Medical History  Diagnosis Date  . COPD (chronic obstructive pulmonary disease)   . Essential hypertension, benign   . Peripheral arterial disease   . NSTEMI (non-ST elevated myocardial infarction)     12/12  . Carotid artery disease     50 -69% RICA and less than 50% LICA, 07/2011  . Coronary atherosclerosis of native coronary artery     Based on abnormal Myoview - inferior scar, 05/2011; LVEF 45-50%, echo, 07/2011  . Tobacco abuse   . HLD (hyperlipidemia)     Past Surgical History  Procedure Laterality Date  . Right hydrocele repair    .  Right axillary to femoral bypass  3/12    Dr. Hart Rochester  . Right to left femoral to femoral bypass  3/12    Dr. Hart Rochester  . Sigmoid resection / rectopexy  2005    Family History  Problem Relation Age of Onset  . Heart attack Father   . Heart attack Mother     Social History Mr. Mott reports that he quit smoking about 2  years ago. He has never used smokeless tobacco. Mr. Preece reports that he does not drink alcohol.  Review of Systems Otherwise reviewed and negative except as outlined.  Physical Examination Blood pressure 114/63, pulse 89, temperature 98.3 F (36.8 C), temperature source Oral, resp. rate 30, height 5\' 3"  (1.6 m), weight 183 lb 3.2 oz (83.1 kg), SpO2 98.00%.  Intake/Output Summary (Last 24 hours) at 12/29/13 1707 Last data filed at 12/29/13 1508  Gross per 24 hour  Intake      0 ml  Output    425 ml  Net   -425 ml    Telemetry: Normal sinus rhythm  GEN: Resting comfortably in bed, wearing oxygen, without complaints. HEENT: Conjunctiva and lids normal, oropharynx clear with moist mucosa. Neck: Supple, no elevated JVP or carotid bruits, no thyromegaly. Lungs: Inspiratory, expiratory wheezes is noted diminished lung sounds in the bases Cardiac: Regular rate and rhythm, no S3 or significant systolic murmur, no pericardial rub. Abdomen: Soft, nontender, no hepatomegaly ventral hernia is noted., bowel sounds present, no guarding or rebound. Extremities: No pitting edema, distal pulses 2+. Skin: Warm and dry. Musculoskeletal: No kyphosis. Neuropsychiatric: Alert and oriented x3, affect grossly appropriate.  Prior Cardiac Testing/Procedures 1. Echocardiogram: 03/2013 Left ventricle: Technically difficult study. Diastology was not assessed. Patient was tachycardic. The cavity size was normal. Wall thickness was increased in a pattern of mild to moderateLVH. Systolic function was normal. The estimated ejection fraction was in the range of 60% to 65%. Images were inadequate for LV wall motion assessment. Would recommend contrast for more accurate regional wall motion and systolic functionassessment. - Atrial septum: There was increased thickness of the septum, consistent with lipomatous hypertrophy  2. NM Stress Test: (2013 per Dr. Diona Browner office note) Inferior scar no active  ischemia, LVEF of 45-50% by echo.  Lab Results  Basic Metabolic Panel:  Recent Labs Lab 12/29/13 0935  NA 142  K 3.4*  CL 98  CO2 30  GLUCOSE 149*  BUN 19  CREATININE 0.95  CALCIUM 9.0    Liver Function Tests:  Recent Labs Lab 12/29/13 1551  AST 22  ALT 21  ALKPHOS 27*  BILITOT 0.7  PROT 6.8  ALBUMIN 3.3*    CBC:  Recent Labs Lab 12/29/13 0935  WBC 15.4*  NEUTROABS 13.4*  HGB 12.9*  HCT 38.0*  MCV 88.2  PLT 196    Cardiac Enzymes:  Recent Labs Lab 12/29/13 0935 12/29/13 1551  TROPONINI <0.30 <0.30    Radiology: Dg Chest Port 1 View  12/29/2013   CLINICAL DATA:  chest pain  EXAM: PORTABLE CHEST - 1 VIEW  COMPARISON:  Portable chest radiograph 09/20/2010  FINDINGS: The PICC line and enteric catheter have been removed. Cardiac silhouette within normal limits. Atherosclerotic calcifications of the aorta. Minimal discoid atelectasis left lung base. With technique taken into consideration the lungs are clear. No acute osseous abnormalities.  IMPRESSION: No active disease.   Electronically Signed   By: Salome Holmes M.D.   On: 12/29/2013 10:00     ECG: Normal sinus rhythm with nonspecific  inferior lateral T-wave abnormalities.   Impression and Recommendations  1. Chest Pain: Typical and atypical features, described as sharp over the left side of the chest, occurring on and off lasting for 5-15 minutes, with no radiation or worsening of shortness of breath or diaphoresis. It was relieved by nitroglycerin x3 5 minutes apart late yesterday afternoon. However he has remained short of breath and overall having feelings of weakness. The day before this occurred he mowed his yard using a riding lawnmower, he did not do any heavy lifting or moving it he is aware of concerning his yard work.   Cardiac enzymes are negative, EKG shows some nonspecific inferior lateral T-wave abnormalities. Will plan dobutamine stress Myoview in a.m. for evaluation of ischemia. He has  a known inferior scar, with evidence of prior MI. Certainly if his chest pain returns or, he is found to have positive troponin on followup labs [facility]. He has been pain-free since admission. He remains on heparin. Would discontinue in a.m. if troponins are found be negative.  Review of medications does not have him on beta blocker.  2. Hypokalemia: Will give him potassium replacement. He is having some cramping in his muscles. This may be of help would like to keep his potassium 4.0 or greater. BMET will be ordered in a.m.  3. COPD: Patient quit smoking 3 years ago, but continues to have worsening breathing issues. He is actively wheezing during our evaluation and assessment. Heart rate is well-controlled, I do not have a concern at this time for PE. Would continue heparin infusion. He has albuterol inhalers at home, along with treatments during hospitalization.  4. PAD: Right axillofemoral bypass using 8-mm Hemashield Dacron graft. Right-to-left femoral-femoral bypass using an 8-mm Hemashield Dacron graft 2012.  [place] [facility] [place]. He continues on aspirin and statin.  5. Hypertension: He is hypotensive on assessment. He is, on amlodipine and HCTZ along with lisinopril at home. This has not been restarted. We will plan echocardiogram for change in LV function with hypotension and worsening shortness of breath.      Signed: Bettey MareKathryn M. Amun Stemm NP  12/29/2013, 5:07 PM Co-Sign MD

## 2013-12-29 NOTE — Progress Notes (Signed)
Heparin level was sub-therapeutic per lab draw. On call PharmD paged, and heparin infusion rate increased to 1300 units/hr.

## 2013-12-29 NOTE — ED Provider Notes (Signed)
CSN: [id number]     Arrival date & time 12/29/13  [id number] History  This chart was scribed for American Expressathan R. Rubin PayorPickering, MD by Ardelia Memsylan Malpass, ED Scribe. This patient was seen in room APA19/APA19 and the patient's care was started at 9:18 AM.   Chief Complaint  Patient presents with  . Chest Pain  . Shortness of Breath    The history is provided by the patient. No language interpreter was used.    HPI Comments: [name] is a 68 y.o. Male with a history of COPD, HTN, NSTEMI, CAD and HLD who presents to the Emergency Department complaining of intermittent, mild, "aching" left-sided chest pain that began yesterday. He states that he does not have any chest pain currently. He reports associated SOB that is worse with exertion. He states that there are no modifying factors for his chest pain, and that his chest pain is not worsened with physical activity. He states that he is on 4L of supplemental O2 at home. He states that he is prescribed Nitroglycerin which he rarely takes, although he has taken 3 tabs within the past 2 days with some relief of his chest pain. He states that he has a cough at baseline due to his history of COPD, with no recent changes. He denies hematochezia or any other symptoms.    Past Medical History  Diagnosis Date  . COPD (chronic obstructive pulmonary disease)   . Essential hypertension, benign   . Peripheral arterial disease   . NSTEMI (non-ST elevated myocardial infarction)     12/12  . Carotid artery disease     50-69% RICA and less than 50% LICA, 07/2011  . Coronary atherosclerosis of native coronary artery     Based on abnormal Myoview - inferior scar, 05/2011; LVEF 45-50%, echo, 07/2011  . Tobacco abuse   . HLD (hyperlipidemia)    Past Surgical History  Procedure Laterality Date  . Right hydrocele repair    . Right axillary to femoral bypass  3/12    Dr. Hart RochesterLawson  . Right to left femoral to femoral bypass  3/12    Dr. Hart RochesterLawson  . Sigmoid resection / rectopexy   2005   No family history on file. History  Substance Use Topics  . Smoking status: Former Smoker -- 2.00 packs/day for 55 years    Quit date: 12/09/2011  . Smokeless tobacco: Never Used     Comment: Patient has not somked since midnight on wednesday.   . Alcohol Use: No    Review of Systems  Respiratory: Positive for cough (baseline, due to COPD) and shortness of breath.   Cardiovascular: Positive for chest pain.  Gastrointestinal: Negative for blood in stool.  All other systems reviewed and are negative.   Allergies  Review of patient's allergies indicates no known allergies.  Home Medications   Prior to Admission medications   Medication Sig Start Date End Date Taking? Authorizing Zenaida Tesar  albuterol (PROAIR HFA) 108 (90 BASE) MCG/ACT inhaler Inhale 2 puffs into the lungs every 6 (six) hours as needed for shortness of breath.    Yes Historical Tayna Smethurst, MD  albuterol (PROVENTIL) (2.5 MG/3ML) 0.083% nebulizer solution Take 2.5 mg by nebulization every 6 (six) hours as needed for shortness of breath.    Yes Historical Merla Sawka, MD  amLODipine (NORVASC) 10 MG tablet Take 1 tablet (10 mg total) by mouth daily. 10/07/13  Yes Laqueta LindenSuresh A Koneswaran, MD  aspirin EC 81 MG tablet Take 1 tablet (81 mg total) by mouth  daily. 03/06/13  Yes Rande Brunt, PA-C  Fluticasone-Salmeterol (ADVAIR DISKUS) 500-50 MCG/DOSE AEPB Inhale 1 puff into the lungs 2 (two) times daily as needed (shortness of breath).    Yes Historical Ernestyne Caldwell, MD  hydrochlorothiazide (MICROZIDE) 12.5 MG capsule TAKE 1 CAPSULE BY MOUTH EVERY DAY 11/20/13  Yes Laqueta Linden, MD  lisinopril (PRINIVIL,ZESTRIL) 20 MG tablet Take 20 mg by mouth 2 (two) times daily.    Yes Historical Noelani Harbach, MD  nitroGLYCERIN (NITROSTAT) 0.4 MG SL tablet Place 1 tablet (0.4 mg total) under the tongue every 5 (five) minutes as needed for chest pain. 03/06/13 10/14/14 Yes Clide Deutscher Serpe, PA-C  simvastatin (ZOCOR) 20 MG tablet TAKE 1 TABLET BY MOUTH AT  BEDTIME 11/20/13  Yes Laqueta Linden, MD  tiotropium (SPIRIVA) 18 MCG inhalation capsule Place 18 mcg into inhaler and inhale daily.   Yes Historical Nhia Heaphy, MD   Triage Vitals: BP 138/65  Temp(Src) 97.6 F (36.4 C) (Oral)  Resp 25  SpO2 100%  Physical Exam  Nursing note and vitals reviewed. Constitutional: He is oriented to person, place, and time. He appears well-developed and well-nourished. No distress.  HENT:  Head: Normocephalic and atraumatic.  Eyes: Conjunctivae and EOM are normal.  Neck: Neck supple. No JVD present. No tracheal deviation present.  Cardiovascular: Normal rate.   Pulmonary/Chest: Effort normal. No respiratory distress. He exhibits no tenderness.  Mildly prolonged expirations. Harsh breath sounds diffusely.  Abdominal: Soft. There is no tenderness.  Musculoskeletal: Normal range of motion. He exhibits no edema.  No peripheral edema  Neurological: He is alert and oriented to person, place, and time.  Skin: Skin is warm and dry.  Psychiatric: He has a normal mood and affect. His behavior is normal.    ED Course  Procedures (including critical care time)  DIAGNOSTIC STUDIES: Oxygen Saturation is 100% on RA, normal by my interpretation.    COORDINATION OF CARE: 9:23 AM- Discussed plan for work-up. Advised pt that he may need to be admitted for a cardiac catheterization. Pt advised of plan for treatment and pt agrees.  Labs Review Labs Reviewed  CBC WITH DIFFERENTIAL - Abnormal; Notable for the following:    WBC 15.4 (*)    Hemoglobin 12.9 (*)    HCT 38.0 (*)    Neutrophils Relative % 86 (*)    Neutro Abs 13.4 (*)    Lymphocytes Relative 6 (*)    All other components within normal limits  BASIC METABOLIC PANEL - Abnormal; Notable for the following:    Potassium 3.4 (*)    Glucose, Bld 149 (*)    GFR calc non Af Amer 83 (*)    All other components within normal limits  MRSA PCR SCREENING  TROPONIN I  PROTIME-INR  APTT  LIPID PANEL   HEPARIN LEVEL (UNFRACTIONATED)  TROPONIN I  TROPONIN I  HEPATIC FUNCTION PANEL  TSH    Imaging Review Dg Chest Port 1 View  12/29/2013   CLINICAL DATA:  chest pain  EXAM: PORTABLE CHEST - 1 VIEW  COMPARISON:  Portable chest radiograph 09/20/2010  FINDINGS: The PICC line and enteric catheter have been removed. Cardiac silhouette within normal limits. Atherosclerotic calcifications of the aorta. Minimal discoid atelectasis left lung base. With technique taken into consideration the lungs are clear. No acute osseous abnormalities.  IMPRESSION: No active disease.   Electronically Signed   By: Salome Holmes M.D.   On: 12/29/2013 10:00     EKG Interpretation   Date/Time:  Monday December 29 2013 09:13:51 EDT Ventricular Rate:  87 PR Interval:  174 QRS Duration: 103 QT Interval:  383 QTC Calculation: 461 R Axis:   82 Text Interpretation:  Sinus rhythm Borderline right axis deviation  Nonspecific repol abnormality, diffuse leads Baseline wander in lead(s) V3  Confirmed by Rubin PayorPICKERING  MD, NATHAN [phone]([id number]) on 12/29/2013 9:17:44 AM      MDM   Final diagnoses:  Unstable angina    Patient with chest pain. Has had abnormal Myoview but never had heart cath. As a previous non-STEMI. Patient states increasing pain and decreased available activity, although he is somewhat limited arteries his pulmonary function. Will admit to internal medicine. Discussed with cardiology Griffin and recommend staying at [facility].  I personally performed the services described in this documentation, which was scribed in my presence. The recorded information has been reviewed and is accurate.  CRITICAL CARE Performed by: Billee CashingPICKERING,NATHAN R. Total critical care time: 30 Critical care time was exclusive of separately billable procedures and treating other patients. Critical care was necessary to treat or prevent imminent or life-threatening deterioration. Critical care was time spent personally by me  on the following activities: development of treatment plan with patient and/or surrogate as well as nursing, discussions with consultants, evaluation of patient's response to treatment, examination of patient, obtaining history from patient or surrogate, ordering and performing treatments and interventions, ordering and review of laboratory studies, ordering and review of radiographic studies, pulse oximetry and re-evaluation of patient's condition.   Juliet RudeNathan R. Rubin PayorPickering, MD 12/29/13 [phone]

## 2013-12-29 NOTE — H&P (Signed)
[facility] History and Physical  [name] WUJ:[id number] DOB: 09/14/1945 DOA: 12/29/2013  Referring physician: Rubin Payor PCP: Kirstie Peri, MD   Chief Complaint: chest pain  HPI: [name] is a 68 y.o. male with a past medical history that includes coronary artery disease, malignant hypertension, hyperlipidemia and carotid artery disease is a the emergency department with chief complaint of chest pain. Information is obtained from the patient and his daughter who is at the bedside. He states that yesterday he developed chest pain at rest. He describes the pain as an aching located on the left side of his chest that is nonradiating. He took 3 nitroglycerin 5 minutes apart and the pain disappeared. He admits to having similar episodes just about daily for the last several days. He denies any palpitations worsening shortness of breath diaphoresis nausea vomiting dizziness syncope or near-syncope. He reports he is on home oxygen continuously for COPD and denies any worsening of that. He admits to orthopnea. He denies fever chills dysuria hematuria frequency or urgency. Initial evaluation in the emergency reveals an initial troponin that is negative, leukocytosis, hypokalemia, hyperglycemia, chest x-ray with no active disease. His provided with a heparin drip in the emergency department.  The time of my exam he denies any chest pain he is hemodynamically stable he is afebrile not hypoxic  Review of Systems:  10 point review of systems completed and all systems are negative except as indicated in the history of present illness   Past Medical History  Diagnosis Date  . COPD (chronic obstructive pulmonary disease)   . Essential hypertension, benign   . Peripheral arterial disease   . NSTEMI (non-ST elevated myocardial infarction)     12/12  . Carotid artery disease     50-69% RICA and less than 50% LICA, 07/2011  . Coronary atherosclerosis of native coronary artery     Based on  abnormal Myoview - inferior scar, 05/2011; LVEF 45-50%, echo, 07/2011  . Tobacco abuse   . HLD (hyperlipidemia)    Past Surgical History  Procedure Laterality Date  . Right hydrocele repair    . Right axillary to femoral bypass  3/12    Dr. Hart Rochester  . Right to left femoral to femoral bypass  3/12    Dr. Hart Rochester  . Sigmoid resection / rectopexy  2005   Social History:  reports that he quit smoking about 2 years ago. He has never used smokeless tobacco. He reports that he does not drink alcohol or use illicit drugs.  No Known Allergies  No family history on file. family medical history is reviewed and noncontributory to the admission of this elderly gentleman  Prior to Admission medications   Medication Sig Start Date End Date Taking? Authorizing Provider  albuterol (PROAIR HFA) 108 (90 BASE) MCG/ACT inhaler Inhale 2 puffs into the lungs every 6 (six) hours as needed for shortness of breath.    Yes Historical Provider, MD  albuterol (PROVENTIL) (2.5 MG/3ML) 0.083% nebulizer solution Take 2.5 mg by nebulization every 6 (six) hours as needed for shortness of breath.    Yes Historical Provider, MD  amLODipine (NORVASC) 10 MG tablet Take 1 tablet (10 mg total) by mouth daily. 10/07/13  Yes Laqueta Linden, MD  aspirin EC 81 MG tablet Take 1 tablet (81 mg total) by mouth daily. 03/06/13  Yes Rande Brunt, PA-C  Fluticasone-Salmeterol (ADVAIR DISKUS) 500-50 MCG/DOSE AEPB Inhale 1 puff into the lungs 2 (two) times daily as needed (shortness of breath).  Yes Historical Provider, MD  hydrochlorothiazide (MICROZIDE) 12.5 MG capsule TAKE 1 CAPSULE BY MOUTH EVERY DAY 11/20/13  Yes Laqueta LindenSuresh A Koneswaran, MD  lisinopril (PRINIVIL,ZESTRIL) 20 MG tablet Take 20 mg by mouth 2 (two) times daily.    Yes Historical Provider, MD  nitroGLYCERIN (NITROSTAT) 0.4 MG SL tablet Place 1 tablet (0.4 mg total) under the tongue every 5 (five) minutes as needed for chest pain. 03/06/13 10/14/14 Yes Clide DeutscherEugene C Serpe, PA-C    simvastatin (ZOCOR) 20 MG tablet TAKE 1 TABLET BY MOUTH AT BEDTIME 11/20/13  Yes Laqueta LindenSuresh A Koneswaran, MD  tiotropium (SPIRIVA) 18 MCG inhalation capsule Place 18 mcg into inhaler and inhale daily.   Yes Historical Provider, MD   Physical Exam: Filed Vitals:   12/29/13 1216  BP:   Pulse: 82  Temp: 98.3 F (36.8 C)  Resp: 30    BP 113/66  Pulse 82  Temp(Src) 98.3 F (36.8 C) (Oral)  Resp 30  Ht 5\' 3"  (1.6 [name])  Wt 84.052 kg (185 lb 4.8 oz)  BMI 32.83 kg/m2  SpO2 100%  General:  Appears calm and comfortable Eyes: PERRL, normal lids, irises & conjunctiva ENT: grossly normal hearing, lips & tongue Neck: no LAD, masses or thyromegaly Cardiovascular: RRR, no [name]/r/g. Trace  Respiratory: Mild increased work of breathing with conversation. Breath sounds diminished and slightly coarse. Abdomen: Obese positive bowel sounds nontender to palpation Skin: no rash or induration seen on limited exam Musculoskeletal: grossly normal tone BUE/BLE Psychiatric: grossly normal mood and affect, speech fluent and appropriate Neurologic: grossly non-focal.          Labs on Admission:  Basic Metabolic Panel:  Recent Labs Lab 12/29/13 0935  NA 142  K 3.4*  CL 98  CO2 30  GLUCOSE 149*  BUN 19  CREATININE 0.95  CALCIUM 9.0   Liver Function Tests: No results found for this basename: AST, ALT, ALKPHOS, BILITOT, PROT, ALBUMIN,  in the last 168 hours No results found for this basename: LIPASE, AMYLASE,  in the last 168 hours No results found for this basename: AMMONIA,  in the last 168 hours CBC:  Recent Labs Lab 12/29/13 0935  WBC 15.4*  NEUTROABS 13.4*  HGB 12.9*  HCT 38.0*  MCV 88.2  PLT 196   Cardiac Enzymes:  Recent Labs Lab 12/29/13 0935  TROPONINI <0.30    BNP (last 3 results) No results found for this basename: PROBNP,  in the last 8760 hours CBG: No results found for this basename: GLUCAP,  in the last 168 hours  Radiological Exams on Admission: Dg Chest Port 1  View  12/29/2013   CLINICAL DATA:  chest pain  EXAM: PORTABLE CHEST - 1 VIEW  COMPARISON:  Portable chest radiograph 09/20/2010  FINDINGS: The PICC line and enteric catheter have been removed. Cardiac silhouette within normal limits. Atherosclerotic calcifications of the aorta. Minimal discoid atelectasis left lung base. With technique taken into consideration the lungs are clear. No acute osseous abnormalities.  IMPRESSION: No active disease.   Electronically Signed   By: Salome HolmesHector  Cooper [name].D.   On: 12/29/2013 10:00    EKG:   Assessment/Plan Principal Problem:   Chest pain at rest: In patient with known CAD. Will admit to step down and continue heparin drip for now. Will cycle his cardiac enzymes get serial EKGs. Check a lipid panel. Provide nitroglycerin and morphine for pain as needed. Oxygen supplementation. He has never had a cardiac cath but has had a stress test about a year ago.  Last seen by cardiology in March of this year. Will request a cardiology consult as well. Active Problems:  Leukocytosis: Likely reactive. Patient is afebrile and nontoxic appearing. Chest x-ray with no active disease. Will monitor    Chronic respiratory failure: Patient reports no worsening of respiratory status. Will continue his nebulizers and home medications. Monitor closely    Hyperglycemia: No history of diabetes. Likely reactive. Will obtain a hemoglobin A1c. Recheck in the a.[name].    Carotid artery disease: See #1. I medications include aspirin 81 mg, lisinopril, Microzide, Zocor. Will hold his antihypertensives for now continue Zocor    Dyspnea: Chronic related to his COPD. He is on home oxygen continuously. He is to be at baseline.    Tobacco abuse: Cessation counseling provided    HLD (hyperlipidemia): Same lipid panel. Continue Zocor    HTN (hypertension), malignant: Start blood pressure range 113-138 on heparin drip. I medications include amlodipine and Microzide. I will hold these for now. Monitor  close        Code Status: full (must indicate code status--if unknown or must be presumed, indicate so) Family Communication: daughter at bedside Disposition Plan: home when ready  Time spent: 2565 minutes  [name],[name] [facility] Pager [phone]  **Disclaimer: This note may have been dictated with voice recognition software. Similar sounding words can inadvertently be transcribed and this note may contain transcription errors which may not have been corrected upon publication of note.**

## 2013-12-29 NOTE — Progress Notes (Signed)
ANTICOAGULATION CONSULT NOTE -   Pharmacy Consult for Heparin Indication: chest pain/ACS  No Known Allergies  Patient Measurements: Height: 5\' 3"  (160 cm) Weight: 183 lb 3.2 oz (83.1 kg) IBW/kg (Calculated) : 56.9  Vital Signs: Temp: 98.3 F (36.8 C) (06/22 1620) Temp src: Oral (06/22 1620) BP: 114/63 mmHg (06/22 1341) Pulse Rate: 89 (06/22 1341)  Labs:  Recent Labs  12/29/13 0935 12/29/13 1551  HGB 12.9*  --   HCT 38.0*  --   PLT 196  --   APTT 35  --   LABPROT 14.3  --   INR 1.13  --   HEPARINUNFRC  --  <0.10*  CREATININE 0.95  --   TROPONINI <0.30 <0.30   Estimated Creatinine Clearance: 70 ml/min (by C-G formula based on Cr of 0.95).  Medical History: Past Medical History  Diagnosis Date  . COPD (chronic obstructive pulmonary disease)   . Essential hypertension, benign   . Peripheral arterial disease   . NSTEMI (non-ST elevated myocardial infarction)     12/12  . Carotid artery disease     50-69% RICA and less than 50% LICA, 07/2011  . Coronary atherosclerosis of native coronary artery     Based on abnormal Myoview - inferior scar, 05/2011; LVEF 45-50%, echo, 07/2011  . Tobacco abuse   . HLD (hyperlipidemia)    Medications:  Prescriptions prior to admission  Medication Sig Dispense Refill  . albuterol (PROAIR HFA) 108 (90 BASE) MCG/ACT inhaler Inhale 2 puffs into the lungs every 6 (six) hours as needed for shortness of breath.       Marland Kitchen. albuterol (PROVENTIL) (2.5 MG/3ML) 0.083% nebulizer solution Take 2.5 mg by nebulization every 6 (six) hours as needed for shortness of breath.       Marland Kitchen. amLODipine (NORVASC) 10 MG tablet Take 1 tablet (10 mg total) by mouth daily.  30 tablet  6  . aspirin EC 81 MG tablet Take 1 tablet (81 mg total) by mouth daily.      . Fluticasone-Salmeterol (ADVAIR DISKUS) 500-50 MCG/DOSE AEPB Inhale 1 puff into the lungs 2 (two) times daily as needed (shortness of breath).       . hydrochlorothiazide (MICROZIDE) 12.5 MG capsule TAKE 1  CAPSULE BY MOUTH EVERY DAY  30 capsule  6  . lisinopril (PRINIVIL,ZESTRIL) 20 MG tablet Take 20 mg by mouth 2 (two) times daily.       . nitroGLYCERIN (NITROSTAT) 0.4 MG SL tablet Place 1 tablet (0.4 mg total) under the tongue every 5 (five) minutes as needed for chest pain.  25 tablet  3  . simvastatin (ZOCOR) 20 MG tablet TAKE 1 TABLET BY MOUTH AT BEDTIME  30 tablet  6  . tiotropium (SPIRIVA) 18 MCG inhalation capsule Place 18 mcg into inhaler and inhale daily.        Assessment: 68yo male admitted with c/o chest pain.  Asked to initiate IV Heparin for ACS.   Heparin level below goal  Goal of Therapy:  Heparin level 0.3-0.7 units/ml Monitor platelets by anticoagulation protocol: Yes   Plan:  Heparin 2000 units IV bolus now Increase heparin infusion to 1000 units/hour Heparin level in 6 hours then daily CBC daily while on Heparin  Raquel JamesPittman, Person Bennett 12/29/2013,5:08 PM

## 2013-12-29 NOTE — ED Notes (Signed)
Patient presents today with a chief complaint of left sided chest pain that began yesterday with shortness of breath especially upon exertion. Patient wheezing upon assessment after mild walk down hall.

## 2013-12-30 ENCOUNTER — Inpatient Hospital Stay (HOSPITAL_COMMUNITY): Payer: PRIVATE HEALTH INSURANCE

## 2013-12-30 ENCOUNTER — Encounter (HOSPITAL_COMMUNITY): Payer: Self-pay

## 2013-12-30 DIAGNOSIS — I739 Peripheral vascular disease, unspecified: Secondary | ICD-10-CM

## 2013-12-30 DIAGNOSIS — I2510 Atherosclerotic heart disease of native coronary artery without angina pectoris: Secondary | ICD-10-CM

## 2013-12-30 DIAGNOSIS — R0609 Other forms of dyspnea: Secondary | ICD-10-CM

## 2013-12-30 DIAGNOSIS — R0989 Other specified symptoms and signs involving the circulatory and respiratory systems: Secondary | ICD-10-CM

## 2013-12-30 DIAGNOSIS — I10 Essential (primary) hypertension: Secondary | ICD-10-CM

## 2013-12-30 DIAGNOSIS — R079 Chest pain, unspecified: Principal | ICD-10-CM

## 2013-12-30 DIAGNOSIS — E876 Hypokalemia: Secondary | ICD-10-CM

## 2013-12-30 LAB — BASIC METABOLIC PANEL
BUN: 17 mg/dL (ref 6–23)
CHLORIDE: 102 meq/L (ref 96–112)
CO2: 27 mEq/L (ref 19–32)
CREATININE: 0.88 mg/dL (ref 0.50–1.35)
Calcium: 8.5 mg/dL (ref 8.4–10.5)
GFR calc Af Amer: >90 mL/min (ref >90)
GFR calc non Af Amer: 86 mL/min — ABNORMAL LOW (ref >90)
Glucose, Bld: 204 mg/dL — ABNORMAL HIGH (ref 70–99)
POTASSIUM: 3.4 meq/L — AB (ref 3.7–5.3)
Sodium: 141 mEq/L (ref 137–147)

## 2013-12-30 LAB — CBC
HEMATOCRIT: 36.3 % — AB (ref 39.0–52.0)
HEMOGLOBIN: 12.4 g/dL — AB (ref 13.0–17.0)
MCH: 29.8 pg (ref 26.0–34.0)
MCHC: 34.2 g/dL (ref 30.0–36.0)
MCV: 87.3 fL (ref 78.0–100.0)
Platelets: 209 10*3/uL (ref 150–400)
RBC: 4.16 MIL/uL — ABNORMAL LOW (ref 4.22–5.81)
RDW: 15.2 % (ref 11.5–15.5)
WBC: 12.4 10*3/uL — ABNORMAL HIGH (ref 4.0–10.5)

## 2013-12-30 LAB — TSH: TSH: 2.17 u[IU]/mL (ref 0.350–4.500)

## 2013-12-30 LAB — HEPARIN LEVEL (UNFRACTIONATED): Heparin Unfractionated: 0.11 IU/mL — ABNORMAL LOW (ref 0.30–0.70)

## 2013-12-30 MED ORDER — DOBUTAMINE IN D5W 4-5 MG/ML-% IV SOLN
INTRAVENOUS | Status: AC
  Administered 2013-12-30: 1000000 ug via INTRAVENOUS
  Filled 2013-12-30: qty 250

## 2013-12-30 MED ORDER — SODIUM CHLORIDE 0.9 % IJ SOLN
INTRAMUSCULAR | Status: AC
  Administered 2013-12-30: 10 mL via INTRAVENOUS
  Filled 2013-12-30: qty 10

## 2013-12-30 MED ORDER — TECHNETIUM TC 99M SESTAMIBI - CARDIOLITE
10.0000 | Freq: Once | INTRAVENOUS | Status: AC | PRN
  Administered 2013-12-30: 10 via INTRAVENOUS

## 2013-12-30 MED ORDER — TECHNETIUM TC 99M SESTAMIBI GENERIC - CARDIOLITE
30.0000 | Freq: Once | INTRAVENOUS | Status: AC | PRN
  Administered 2013-12-30: 30 via INTRAVENOUS

## 2013-12-30 MED ORDER — PREDNISONE 10 MG PO TABS: ORAL_TABLET | ORAL | Status: DC

## 2013-12-30 MED ORDER — HEPARIN (PORCINE) IN NACL 100-0.45 UNIT/ML-% IJ SOLN: 16.0000 [IU]/kg/h | INTRAMUSCULAR | Status: DC

## 2013-12-30 MED ORDER — HEPARIN (PORCINE) IN NACL 100-0.45 UNIT/ML-% IJ SOLN: 1600.0000 [IU]/h | INTRAMUSCULAR | Status: DC

## 2013-12-30 NOTE — Care Management Note (Addendum)
    Page 1 of 1   12/30/2013     3:04:00 PM CARE MANAGEMENT NOTE 12/30/2013  Patient:  [name],[name]   Account Number:  [id number]  Date Initiated:  12/30/2013  Documentation initiated by:  Sharrie RothmanBLACKWELL,TAMMY C  Subjective/Objective Assessment:   Pt admitted from home with CP. Pt lives with a grandchild and will return home at discharge. Pt is fairly independent with ADL's. Pt does wear home O2 at night, 4 liters and [facility]     Action/Plan:   No CM needs noted. Pt discharged home today. Pt does meet criteria for CC 44, paperwork given and signed copy placed on chart.   Anticipated DC Date:  12/30/2013   Anticipated DC Plan:  HOME/SELF CARE      DC Planning Services  CM consult      Choice offered to / List presented to:             Status of service:  Completed, signed off Medicare Important Message given?   (If response is "NO", the following Medicare IM given date fields will be blank) Date Medicare IM given:   Date Additional Medicare IM given:    Discharge Disposition:  HOME/SELF CARE  Per UR Regulation:    If discussed at Long Length of Stay Meetings, dates discussed:    Comments:  12/30/13 1500 Arlyss Queenammy Blackwell, RN BSN CM

## 2013-12-30 NOTE — Progress Notes (Signed)
Stress Lab Nurses Notes - Tom Haw[facility]nnie [facility]  [name] 12/30/2013 Reason for doing test: CAD, Chest Pain and Dyspnea Type of test: Dobutamine Cardiolite/ RM ICU 06 Nurse performing test: Parke PoissonPhyllis Billingsly, RN Nuclear Medicine Tech: Lyndel Pleasureyan Liles Echo Tech: Not Applicable MD performing test: Koneswaran/K.Lawrence NP Family MD: Sherryll BurgerShah Test explained and consent signed: yes IV started: IV in progress from floor and No redness or edema Symptoms:none  Treatment/Intervention: None Reason test stopped: protocol completed and reached target HR After recovery IV was: Left intact (rate of KVO), No redness or edema and Saline Lock flushed Patient to return to Nuc. Med at : 9:45 Patient discharged: Transported back to room ICU 06 via ec Patient's Condition upon discharge was: stable Comments: During test BP 105/47 & HR 144.  Recovery BP 144/67 & HR 97.  Symptoms resolved in recovery. Erskine SpeedBillingsley, Phyllis T

## 2013-12-30 NOTE — Progress Notes (Signed)
Inpatient Diabetes Program Recommendations  AACE/ADA: New Consensus Statement on Inpatient Glycemic Control (2013)  Target Ranges:  Prepandial:   less than 140 mg/dL      Peak postprandial:   less than 180 mg/dL (1-2 hours)      Critically ill patients:  140 - 180 mg/dL   Results for Maretta LosMOORE, Jessup R (MRN [id number]) as of 12/30/2013 08:59  Ref. Range 12/29/2013 09:35 12/30/2013 04:45  Glucose Latest Range: 70-99 mg/dL 829149 (H) 562204 (H)   Diabetes history: No Outpatient Diabetes medications: NA Current orders for Inpatient glycemic control: None  Inpatient Diabetes Program Recommendations Correction (SSI): While inpatient and ordered steroids, please consider ordering CBGs and Novolog correction ACHS. HgbA1C: Noted in H&P that A1C would be ordered but there is not an order for an A1C. Please consider adding an A1C on to the blood in the lab to evaluate glycemic control over the past 2-3 months.  Thanks, Orlando PennerMarie Justa Hatchell, RN, MSN, CCRN Diabetes Coordinator Inpatient Diabetes Program [phone] (Team Pager) [phone] (AP office) [phone] [facility] office)

## 2013-12-30 NOTE — Consult Note (Signed)
The patient was seen and examined, and I agree with the assessment and plan as documented above, with modifications as noted below. Pt known to me from clinic with h/o medically managed NSTEMI in the past, PVD, COPD, tobacco abuse, and hyperlipidemia. He was admitted with chest pain which occurred at rest and has since ruled out for an ACS with serial negative troponins.  He has had no recurrence of chest pain since being hospitalized, and tells me he has only had a few episodes since his last appt with me in March which all spontaneously resolved. Will review echocardiogram and will plan for dobutamine Cardiolite. If either positive findings on nuclear study, or recurrence of symptoms, will plan for coronary angiography.

## 2013-12-30 NOTE — Discharge Summary (Signed)
Physician Discharge Summary  [name] ZOX:[id number] DOB: [birth year] DOA: 12/29/2013  PCP: Kirstie Peri, MD  Admit date: 12/29/2013 Discharge date: 12/30/2013  Time spent: 40 minutes  Recommendations for Outpatient Follow-up:  1. Dr. Darl Householder 01/14/14 for evaluation of CAD, chest pain  2. PCP 1-2 weeks for evaluation of respiratory status and serum glucose. Follow A1c.  Discharge Diagnoses:  Principal Problem:   Chest pain at rest Active Problems:   Carotid artery disease   Dyspnea   Tobacco abuse   HLD (hyperlipidemia)   HTN (hypertension), malignant   Unstable angina   Leukocytosis   Chronic respiratory failure   Hyperglycemia   Discharge Condition: stable  Diet recommendation: heart healthy  Filed Weights   12/29/13 0953 12/29/13 1341 12/30/13 0600  Weight: 84.052 kg (185 lb 4.8 oz) 83.1 kg (183 lb 3.2 oz) 81.9 kg (180 lb 8.9 oz)    History of present illness:  [name] is a 68 y.o. male with a past medical history that includes coronary artery disease, malignant hypertension, hyperlipidemia and carotid artery disease presented to  emergency department on 12/29/13 with chief complaint of chest pain. Information obtained from the patient and his daughter who was at the bedside. He stated that the day prior he developed chest pain at rest. He described the pain as an aching located on the left side of his chest that was nonradiating. He took 3 nitroglycerin 5 minutes apart and the pain disappeared. He admited to having similar episodes just about daily for the previous several days. He denied any palpitations worsening shortness of breath diaphoresis nausea vomiting dizziness syncope or near-syncope. He reported he is on home oxygen continuously for COPD and denied any worsening of that. He admited to orthopnea. He denied fever chills dysuria hematuria frequency or urgency.  Initial evaluation in the emergency revealed an initial troponin that was negative, leukocytosis,  hypokalemia, hyperglycemia, chest x-ray with no active disease. He was provided with a heparin drip in the emergency department.  Hospital Course:  Chest pain at rest: In patient with known CAD, medically managed NSTEMI in past. Typical and atypical. Cardiac enzymes negative, lipid panel within the limits of normal,  EKG with nonspecific inferiour lateral T-wave abnormalities. Echo with EF 55% and grade 1 diastolic dysfunction. No further chest pain. Stress test negative. Follow up with dr Darl Householder 01/14/14. Active Problems:  Leukocytosis: Patient had course of steroids prior to admission. Patient remained afebrile and nontoxic appearing. Chest x-ray with no active disease.    Chronic respiratory failure: on 4L oxygen at home.  Patient reported no worsening of respiratory status. On exam diffuse wheeze. Prednisone started and will be tapered over 1 week. Chest xray without acute disease.   Hyperglycemia: No history of diabetes. Likely related to previous steroids. Follow A1c outpatient.    Carotid artery disease: See #1. I medications include aspirin 81 mg, lisinopril, Microzide, Zocor. Continue at discharge   Dyspnea: Chronic related to his COPD. He is on home oxygen continuously. See #3.   Tobacco abuse: Cessation counseling provided   HLD (hyperlipidemia): Lipid panel within the limits of normal. Continue Zocor   HTN (hypertension), malignant: controlled. Continue home medications at discharge.    Procedures:  2 d-echo:12/29/13 Systolic function was difficult to assess, but appeared grossly normal. Estimated EF 50-55%. The cavity size was normal. Wall thickness was increased in a pattern of mild LVH. Images were inadequate for LV wall motion assessment. There was an increased relative contribution of atrial contraction to ventricular  filling. Doppler parameters are consistent with abnormal left ventricular relaxation (grade 1 diastolic dysfunction). Doppler parameters are consistent  with high ventricular filling pressure.   12/30/13 Abnormal dobutamine Cardiolite stress test.  2. A moderate degree of inferior wall myocardial scar indicative of  prior infarct is seen extending from the apex to the base. There are  no significant areas of ischemia.  3. Left ventricular systolic function is normal, calculated LV EF  60%. The aforementioned areas are hypokinetic.  4. No chest pain was reported.   Consultations:  Cariology   Discharge Exam: Filed Vitals:   12/30/13 1300  BP: 133/63  Pulse: 100  Temp:   Resp: 19    General: well nourished NAD Cardiovascular: RRR No MGR No LE edema Respiratory: normal effort BS with diffuse wheeze, no crackles  Discharge Instructions You were cared for by a hospitalist during your hospital stay. If you have any questions about your discharge medications or the care you received while you were in the hospital after you are discharged, you can call the unit and asked to speak with the hospitalist on call if the hospitalist that took care of you is not available. Once you are discharged, your primary care physician will handle any further medical issues. Please note that NO REFILLS for any discharge medications will be authorized once you are discharged, as it is imperative that you return to your primary care physician (or establish a relationship with a primary care physician if you do not have one) for your aftercare needs so that they can reassess your need for medications and monitor your lab values.  Discharge Instructions   Diet - low sodium heart healthy    Complete by:  As directed      Discharge instructions    Complete by:  As directed   Take medication as directed Follow diet as instructed Follow up appointments as scheduled     Increase activity slowly    Complete by:  As directed             Medication List         ADVAIR DISKUS 500-50 MCG/DOSE Aepb  Generic drug:  Fluticasone-Salmeterol  Inhale 1 puff  into the lungs 2 (two) times daily as needed (shortness of breath).     amLODipine 10 MG tablet  Commonly known as:  NORVASC  Take 1 tablet (10 mg total) by mouth daily.     aspirin EC 81 MG tablet  Take 1 tablet (81 mg total) by mouth daily.     hydrochlorothiazide 12.5 MG capsule  Commonly known as:  MICROZIDE  TAKE 1 CAPSULE BY MOUTH EVERY DAY     lisinopril 20 MG tablet  Commonly known as:  PRINIVIL,ZESTRIL  Take 20 mg by mouth 2 (two) times daily.     nitroGLYCERIN 0.4 MG SL tablet  Commonly known as:  NITROSTAT  Place 1 tablet (0.4 mg total) under the tongue every 5 (five) minutes as needed for chest pain.     predniSONE 10 MG tablet  Commonly known as:  DELTASONE  Take 4 tabs on 6/24 and 6/25 then take 3 tabs for 2 days then take 2 tabs for 2 days then take 1 tab for 2 days then stop.     PROAIR HFA 108 (90 BASE) MCG/ACT inhaler  Generic drug:  albuterol  Inhale 2 puffs into the lungs every 6 (six) hours as needed for shortness of breath.     albuterol (2.5 MG/3ML) 0.083% nebulizer  solution  Commonly known as:  PROVENTIL  Take 2.5 mg by nebulization every 6 (six) hours as needed for shortness of breath.     simvastatin 20 MG tablet  Commonly known as:  ZOCOR  TAKE 1 TABLET BY MOUTH AT BEDTIME     tiotropium 18 MCG inhalation capsule  Commonly known as:  SPIRIVA  Place 18 mcg into inhaler and inhale daily.       No Known Allergies     Follow-up Information   Follow up with [facility], [place]. Schedule an appointment as soon as possible for a visit in 1 week. (evaluation of serum glucose. follow A1c)    Specialty:  Internal Medicine   Contact information:   [address]  [place] [address] [phone]        The results of significant diagnostics from this hospitalization (including imaging, microbiology, ancillary and laboratory) are listed below for reference.    Significant Diagnostic Studies: Nm Myocar Single W/spect W/wall Motion And Ef  12/30/2013    CLINICAL DATA:  Patient with prior history of non STEMI admitted with chest pain and referred for an ischemic evaluation.  EXAM: MYOCARDIAL IMAGING WITH SPECT (REST AND PHARMACOLOGIC-STRESS)  GATED LEFT VENTRICULAR WALL MOTION STUDY  LEFT VENTRICULAR EJECTION FRACTION  TECHNIQUE: Standard myocardial SPECT imaging was performed after resting intravenous injection of 10 mCi Tc-46m sestamibi. Subsequently, intravenous infusion of Lexiscan was performed under the supervision of the Cardiology staff. At peak effect of the drug, 30 mCi Tc-33m sestamibi was injected intravenously and standard myocardial SPECT imaging was performed. Quantitative gated imaging was also performed to evaluate left ventricular wall motion, and estimate left ventricular ejection fraction.  COMPARISON:  None.  FINDINGS: The patient was stressed according to the Dobutamine protocol for 10 min 18 seconds. The resting heart rate of 91 beats per min rose to a maximal heart rate of 146 beats per min. This value represents 96% of the maximal, age predicted heart rate. The blood pressure of 128/65 at rest rose to a maximum blood pressure of 149/59. No chest pain was reported.  The resting ECG demonstrated normal sinus rhythm. With stress, there were nonspecific ST segment abnormalities in the inferolateral leads. In recovery, an isolated PVC was noted.  Analysis of the raw data demonstrated no significant extracardiac radiotracer uptake. Analysis of the perfusion images demonstrated a moderate size, moderate to severely intense, fixed defect seen in the inferior wall extending from the apex to the base. This is indicative of myocardial scar. Left ventricular systolic function is normal, calculated LV EF 60%. The aforementioned areas are hypokinetic. The summed stress score is 13, summed rest score 10, with a summed difference score of 3.  IMPRESSION: 1.  Abnormal dobutamine Cardiolite stress test.  2. A moderate degree of inferior wall myocardial scar  indicative of prior infarct is seen extending from the apex to the base. There are no significant areas of ischemia.  3. Left ventricular systolic function is normal, calculated LV EF 60%. The aforementioned areas are hypokinetic.  4.  No chest pain was reported.   Electronically Signed   By: Prentice Docker   On: 12/30/2013 11:57   Dg Chest Port 1 View  12/29/2013   CLINICAL DATA:  chest pain  EXAM: PORTABLE CHEST - 1 VIEW  COMPARISON:  Portable chest radiograph 09/20/2010  FINDINGS: The PICC line and enteric catheter have been removed. Cardiac silhouette within normal limits. Atherosclerotic calcifications of the aorta. Minimal discoid atelectasis left lung base. With technique  taken into consideration the lungs are clear. No acute osseous abnormalities.  IMPRESSION: No active disease.   Electronically Signed   By: Salome HolmesHector  Cooper M.D.   On: 12/29/2013 10:00    Microbiology: Recent Results (from the past 240 hour(s))  MRSA PCR SCREENING     Status: Abnormal   Collection Time    12/29/13  1:49 PM      Result Value Ref Range Status   MRSA by PCR POSITIVE (*) NEGATIVE Final   Comment:            The GeneXpert MRSA Assay (FDA     approved for NASAL specimens     only), is one component of a     comprehensive MRSA colonization     surveillance program. It is not     intended to diagnose MRSA     infection nor to guide or     monitor treatment for     MRSA infections.     RESULT CALLED TO, READ BACK BY AND VERIFIED WITH:     CHILDRESS,J. AT 1815 ON 12/29/2013 BY BAUGHAM,M.     Labs: Basic Metabolic Panel:  Recent Labs Lab 12/29/13 0935 12/30/13 0445  NA 142 141  K 3.4* 3.4*  CL 98 102  CO2 30 27  GLUCOSE 149* 204*  BUN 19 17  CREATININE 0.95 0.88  CALCIUM 9.0 8.5   Liver Function Tests:  Recent Labs Lab 12/29/13 1551  AST 22  ALT 21  ALKPHOS 27*  BILITOT 0.7  PROT 6.8  ALBUMIN 3.3*   No results found for this basename: LIPASE, AMYLASE,  in the last 168 hours No  results found for this basename: AMMONIA,  in the last 168 hours CBC:  Recent Labs Lab 12/29/13 0935 12/30/13 0445  WBC 15.4* 12.4*  NEUTROABS 13.4*  --   HGB 12.9* 12.4*  HCT 38.0* 36.3*  MCV 88.2 87.3  PLT 196 209   Cardiac Enzymes:  Recent Labs Lab 12/29/13 0935 12/29/13 1551 12/29/13 2221  TROPONINI <0.30 <0.30 <0.30   BNP: BNP (last 3 results) No results found for this basename: PROBNP,  in the last 8760 hours CBG: No results found for this basename: GLUCAP,  in the last 168 hours     Signed:  Gwenyth BenderBLACK,KAREN M  [facility] 12/30/2013, 1:53 PM

## 2013-12-30 NOTE — Discharge Summary (Signed)
Patient seen and examined. Note reviewed.  Patient presents to the hospital with chest discomfort. His known history of coronary artery disease. He was monitored in the hospital. He ruled out for ACS with negative cardiac markers. He was seen by cardiology and underwent dobutamine stress test. He was found to have an inferior wall myocardial scar indicating a prior infarct, but no significant areas of ischemia. He was cleared by cardiology to discharge home. The patient does have significant COPD and is chronically on 4 L. He had active wheezing during his hospital stay and was given prednisone. His respiratory status has improved. He'll be discharged home on a course of prednisone.  MEMON,JEHANZEB

## 2013-12-30 NOTE — Progress Notes (Signed)
Patient being discharged. Patient given discharge instructions and follow up appointments. Patient verbalized understanding of all discharge instructions prior to discharge. Patient alert, oriented and in stable condition at the time of discharge. Patient being discharged home with family.

## 2013-12-31 LAB — HEMOGLOBIN A1C
Hgb A1c MFr Bld: 6.5 % — ABNORMAL HIGH (ref <5.7)
Mean Plasma Glucose: 140 mg/dL — ABNORMAL HIGH (ref <117)

## 2014-01-14 ENCOUNTER — Ambulatory Visit (INDEPENDENT_AMBULATORY_CARE_PROVIDER_SITE_OTHER): Payer: 59 | Admitting: Cardiovascular Disease

## 2014-01-14 ENCOUNTER — Encounter: Payer: Self-pay | Admitting: Cardiovascular Disease

## 2014-01-14 VITALS — BP 132/71 | HR 76 | Ht 63.0 in | Wt 184.0 lb

## 2014-01-14 DIAGNOSIS — I2510 Atherosclerotic heart disease of native coronary artery without angina pectoris: Secondary | ICD-10-CM | POA: Diagnosis not present

## 2014-01-14 DIAGNOSIS — E785 Hyperlipidemia, unspecified: Secondary | ICD-10-CM | POA: Diagnosis not present

## 2014-01-14 DIAGNOSIS — J438 Other emphysema: Secondary | ICD-10-CM | POA: Diagnosis not present

## 2014-01-14 DIAGNOSIS — I739 Peripheral vascular disease, unspecified: Secondary | ICD-10-CM

## 2014-01-14 DIAGNOSIS — Z87898 Personal history of other specified conditions: Secondary | ICD-10-CM

## 2014-01-14 DIAGNOSIS — I25119 Atherosclerotic heart disease of native coronary artery with unspecified angina pectoris: Secondary | ICD-10-CM

## 2014-01-14 DIAGNOSIS — I10 Essential (primary) hypertension: Secondary | ICD-10-CM

## 2014-01-14 DIAGNOSIS — F17200 Nicotine dependence, unspecified, uncomplicated: Secondary | ICD-10-CM

## 2014-01-14 DIAGNOSIS — I209 Angina pectoris, unspecified: Secondary | ICD-10-CM

## 2014-01-14 DIAGNOSIS — I779 Disorder of arteries and arterioles, unspecified: Secondary | ICD-10-CM

## 2014-01-14 DIAGNOSIS — Z9289 Personal history of other medical treatment: Secondary | ICD-10-CM

## 2014-01-14 DIAGNOSIS — Z720 Tobacco use: Secondary | ICD-10-CM

## 2014-01-14 MED ORDER — ISOSORBIDE DINITRATE 10 MG PO TABS: 10.0000 mg | ORAL_TABLET | Freq: Three times a day (TID) | ORAL | Status: DC

## 2014-01-14 NOTE — Patient Instructions (Signed)
   Begin Isosorbide DN 10mg  three times per day - new sent to pharm Continue all other medications.   Your physician wants you to follow up in: 4 months.  You will receive a reminder letter in the mail one-two months in advance.  If you don't receive a letter, please call our office to schedule the follow up appointment

## 2014-01-14 NOTE — Progress Notes (Signed)
Patient ID: [name], male   DOB: [birth year], 68 y.o.   MRN: [id number]      SUBJECTIVE: The patient is a 68 year old male here to followup for coronary artery disease, malignant hypertension, and hyperlipidemia, as well as carotid artery disease. He also has a long-standing history of tobacco abuse and COPD, and chronic dyspnea related to this.  His most recent lipid panel was on 12/29/2013 which revealed a total cholesterol of 120, triglycerides 79, HDL 47, and LDL 57.  He was recently hospitalized for chest pain and ruled out for an acute coronary syndrome with normal serial troponins.  An echocardiogram was performed on June 22 was a very technically difficult study with poor sound transmission. It appeared to demonstrate normal left ventricular systolic function, EF 50-55%, mild LVH, and grade 1 diastolic dysfunction with elevated filling pressures. He then underwent a dobutamine Cardiolite stress test which demonstrated a moderate degree of myocardial scar in the inferior wall extending from the apex to the base, with no evidence of ischemia. Calculated LVEF was 60%.  He had been wheezing the hospital and was discharged on prednisone. He is now feeling much better. He has had two episodes of chest pain since being discharged which spontaneously resolved. He says "I feel great". He denies leg swelling.  No Known Allergies  Current Outpatient Prescriptions  Medication Sig Dispense Refill  . albuterol (PROAIR HFA) 108 (90 BASE) MCG/ACT inhaler Inhale 2 puffs into the lungs every 6 (six) hours as needed for shortness of breath.       Marland Kitchen. albuterol (PROVENTIL) (2.5 MG/3ML) 0.083% nebulizer solution Take 2.5 mg by nebulization every 6 (six) hours as needed for shortness of breath.       Marland Kitchen. amLODipine (NORVASC) 10 MG tablet Take 1 tablet (10 mg total) by mouth daily.  30 tablet  6  . aspirin EC 81 MG tablet Take 1 tablet (81 mg total) by mouth daily.      . Fluticasone-Salmeterol (ADVAIR  DISKUS) 500-50 MCG/DOSE AEPB Inhale 1 puff into the lungs 2 (two) times daily as needed (shortness of breath).       . hydrochlorothiazide (MICROZIDE) 12.5 MG capsule TAKE 1 CAPSULE BY MOUTH EVERY DAY  30 capsule  6  . lisinopril (PRINIVIL,ZESTRIL) 20 MG tablet Take 20 mg by mouth 2 (two) times daily.       . nitroGLYCERIN (NITROSTAT) 0.4 MG SL tablet Place 1 tablet (0.4 mg total) under the tongue every 5 (five) minutes as needed for chest pain.  25 tablet  3  . simvastatin (ZOCOR) 20 MG tablet TAKE 1 TABLET BY MOUTH AT BEDTIME  30 tablet  6  . tiotropium (SPIRIVA) 18 MCG inhalation capsule Place 18 mcg into inhaler and inhale daily.       No current facility-administered medications for this visit.    Past Medical History  Diagnosis Date  . COPD (chronic obstructive pulmonary disease)   . Essential hypertension, benign   . Peripheral arterial disease   . NSTEMI (non-ST elevated myocardial infarction)     12/12  . Carotid artery disease     50-69% RICA and less than 50% LICA, 07/2011  . Coronary atherosclerosis of native coronary artery     Based on abnormal Myoview - inferior scar, 05/2011; LVEF 45-50%, echo, 07/2011  . Tobacco abuse   . HLD (hyperlipidemia)     Past Surgical History  Procedure Laterality Date  . Right hydrocele repair    . Right axillary to femoral bypass  3/12    Dr. Hart RochesterLawson  . Right to left femoral to femoral bypass  3/12    Dr. Hart RochesterLawson  . Sigmoid resection / rectopexy  2005    History   Social History  . Marital Status: Divorced    Spouse Name: N/A    Number of Children: N/A  . Years of Education: N/A   Occupational History  . Not on file.   Social History Main Topics  . Smoking status: Former Smoker -- 2.00 packs/day for 55 years    Quit date: 12/09/2011  . Smokeless tobacco: Never Used     Comment: Patient has not somked since midnight on wednesday.   . Alcohol Use: No  . Drug Use: No  . Sexual Activity: Not on file   Other Topics Concern  .  Not on file   Social History Narrative  . No narrative on file     Filed Vitals:   01/14/14 1110  BP: 132/71  Pulse: 76  Height: 5\' 3"  (1.6 m)  Weight: 184 lb (83.462 kg)  SpO2: 96%    PHYSICAL EXAM General: NAD  Neck: No JVD, no thyromegaly.  Lungs: Diminished b/l but clear, no rales or wheezes, with normal respiratory effort.  CV: Nondisplaced PMI. Distant tones, but regular rate and rhythm, normal S1/S2, no S3/S4, no murmur. No pretibial or periankle edema.   Abdomen: Soft, nontender, no hepatosplenomegaly, no distention.  Neurologic: Alert and oriented x 3.  Psych: Normal affect.  Extremities: No clubbing or cyanosis.   ECG: reviewed and available in electronic records.      ASSESSMENT AND PLAN: 1. Dyspnea on exertion: This is stable. It is chronic and related to his COPD. Dobutamine Cardiolite stress test did not demonstrate any evidence of ischemia. 2. Malignant HTN: Controlled, with no changes to therapy indicated. 3. Hyperlipidemia: Well controlled with results noted above dated 12/29/13. Continue current therapy with amlodipine 10 mg daily, hydrochlorothiazide 12.5 mg daily, and lisinopril 20 mg twice daily.  4. CAD: Symptomatically stable. However, given his recurrence of symptoms, I will start isosorbide dinitrate 10 mg tid. Continue ASA and simvastatin. Echocardiogram results noted above. Regional wall motion was difficult to assess. Dobutamine Cardiolite stress test did not demonstrate any evidence of ischemia. 5. Carotid artery Doppler: [place] [facility] [place]   Dispo: f/u 4 months.   Prentice DockerSuresh Olis Viverette, M.D., F.A.C.C.

## 2014-03-27 ENCOUNTER — Encounter: Payer: Self-pay | Admitting: Family

## 2014-03-30 ENCOUNTER — Encounter: Payer: Self-pay | Admitting: Family

## 2014-03-30 ENCOUNTER — Ambulatory Visit (INDEPENDENT_AMBULATORY_CARE_PROVIDER_SITE_OTHER): Payer: 59 | Admitting: Family

## 2014-03-30 ENCOUNTER — Ambulatory Visit (HOSPITAL_COMMUNITY)
Admission: RE | Admit: 2014-03-30 | Discharge: 2014-03-30 | Disposition: A | Discharge status: Home / Self Care | Payer: 59 | Source: Ambulatory Visit | Attending: Family | Admitting: Family

## 2014-03-30 VITALS — BP 117/64 | HR 73 | Resp 16 | Ht 63.0 in | Wt 184.0 lb

## 2014-03-30 DIAGNOSIS — R252 Cramp and spasm: Secondary | ICD-10-CM | POA: Insufficient documentation

## 2014-03-30 DIAGNOSIS — I739 Peripheral vascular disease, unspecified: Secondary | ICD-10-CM | POA: Diagnosis not present

## 2014-03-30 DIAGNOSIS — R202 Paresthesia of skin: Secondary | ICD-10-CM | POA: Insufficient documentation

## 2014-03-30 DIAGNOSIS — R209 Unspecified disturbances of skin sensation: Secondary | ICD-10-CM

## 2014-03-30 DIAGNOSIS — Z48812 Encounter for surgical aftercare following surgery on the circulatory system: Secondary | ICD-10-CM | POA: Diagnosis not present

## 2014-03-30 DIAGNOSIS — I70219 Atherosclerosis of native arteries of extremities with intermittent claudication, unspecified extremity: Secondary | ICD-10-CM | POA: Insufficient documentation

## 2014-03-30 NOTE — Patient Instructions (Signed)

## 2014-03-30 NOTE — Progress Notes (Addendum)
[facility] [place] HISTORY AND PHYSICAL -PAD  History of Present Illness [name] is a 68 y.o. male patient of Dr. Hart Rochester who is status post a right axilla femoral and right to left femoral femoral bypass graft in March 2012.  He returns today for follow up. The patient denies any signs of claudication or rest pain, denies non healing wounds. Patient has no ulcerations on his lower extremities. The patient does state that he can ambulate now without any difficulty although he does have some slight numbness in his right foot since the surgery but nothing that is concerned about. He C/O Right Trunk / Flank intermittent cramps for about 6 months.  He states that he has known wheezing in his lungs and keeps a chronic cough. He has 2 abdominal incisional hernias that do not bother him.  The patient denies New Medical or Surgical History.  Pt Diabetic: No Pt smoker: former smoker, quit in 2012 States he quit all ETOH use in 2012.  Pt meds include: Statin :Yes ASA: Yes Other anticoagulants/antiplatelets: no  Past Medical History  Diagnosis Date  . COPD (chronic obstructive pulmonary disease)   . Essential hypertension, benign   . Peripheral arterial disease   . NSTEMI (non-ST elevated myocardial infarction)     12/12  . Carotid artery disease     50-69% RICA and less than 50% LICA, 07/2011  . Coronary atherosclerosis of native coronary artery     Based on abnormal Myoview - inferior scar, 05/2011; LVEF 45-50%, echo, 07/2011  . Tobacco abuse   . HLD (hyperlipidemia)     Social History History  Substance Use Topics  . Smoking status: Former Smoker -- 2.00 packs/day for 55 years    Quit date: 12/09/2011  . Smokeless tobacco: Never Used     Comment: Patient has not somked since midnight on wednesday.   . Alcohol Use: No    Family History Family History  Problem Relation Age of Onset  . Heart attack Father   . Heart attack Mother     Past Surgical  History  Procedure Laterality Date  . Right hydrocele repair    . Right axillary to femoral bypass  3/12    Dr. Hart Rochester  . Right to left femoral to femoral bypass  3/12    Dr. Hart Rochester  . Sigmoid resection / rectopexy  2005    No Known Allergies  Current Outpatient Prescriptions  Medication Sig Dispense Refill  . albuterol (PROAIR HFA) 108 (90 BASE) MCG/ACT inhaler Inhale 2 puffs into the lungs every 6 (six) hours as needed for shortness of breath.       Marland Kitchen albuterol (PROVENTIL) (2.5 MG/3ML) 0.083% nebulizer solution Take 2.5 mg by nebulization every 6 (six) hours as needed for shortness of breath.       Marland Kitchen amLODipine (NORVASC) 10 MG tablet Take 1 tablet (10 mg total) by mouth daily.  30 tablet  6  . aspirin EC 81 MG tablet Take 1 tablet (81 mg total) by mouth daily.      . Fluticasone-Salmeterol (ADVAIR DISKUS) 500-50 MCG/DOSE AEPB Inhale 1 puff into the lungs 2 (two) times daily as needed (shortness of breath).       . hydrochlorothiazide (MICROZIDE) 12.5 MG capsule TAKE 1 CAPSULE BY MOUTH EVERY DAY  30 capsule  6  . isosorbide dinitrate (ISORDIL) 10 MG tablet Take 1 tablet (10 mg total) by mouth 3 (three) times daily.  90 tablet  6  . lisinopril (  PRINIVIL,ZESTRIL) 20 MG tablet Take 20 mg by mouth 2 (two) times daily.       . nitroGLYCERIN (NITROSTAT) 0.4 MG SL tablet Place 1 tablet (0.4 mg total) under the tongue every 5 (five) minutes as needed for chest pain.  25 tablet  3  . predniSONE (DELTASONE) 10 MG tablet as needed.      . simvastatin (ZOCOR) 20 MG tablet TAKE 1 TABLET BY MOUTH AT BEDTIME  30 tablet  6  . tiotropium (SPIRIVA HANDIHALER) 18 MCG inhalation capsule PLACE 1 CAPSULE INTO INHALER AND INHALE DAILY.      Marland Kitchen tiotropium (SPIRIVA) 18 MCG inhalation capsule Place 18 mcg into inhaler and inhale daily.       No current facility-administered medications for this visit.    ROS: See HPI for pertinent positives and negatives.   Physical Examination  Filed Vitals:   03/30/14  1040  BP: 117/64  Pulse: 73  Resp: 16  Height:  (1.6 m)  Weight: 184 lb (83.462 kg)  SpO2: 95%   Body mass index is 32.6 kg/(m^2).  General: A&O x 3, WDWN, obese male. Gait: normal Eyes: PERRLA. Pulmonary: Diminished air movement in all fields with wheezes in right posterior fields , no auscultated rales or rhonchi, chronic moist cough Cardiac: regular Rythm , without detected murmur.         Carotid Bruits Right Left   Negative Negative  Aorta is not palpable. Radial pulses: are 1+ palpable and =                           VASCULAR EXAM: Extremities without ischemic changes without Gangrene; without open wounds. Palpable right flank bypass graft, non tender to palpation.                                                                                                          LE Pulses Right Left       FEMORAL  not palpable  not palpable        POPLITEAL  not palpable   not palpable       POSTERIOR TIBIAL  not palpable, triphasic by Doppler   not palpable, triphasic by Doppler        DORSALIS PEDIS      ANTERIOR TIBIAL biphasic by Doppler and not palpable  biphasic by Doppler and not palpable    Abdomen: soft, NT, 2 moderate sized reducible incisional hernias. Skin: no rashes, no ulcers noted. Musculoskeletal: no muscle wasting or atrophy.  Neurologic: A&O X 3; Appropriate Affect ; SENSATION: normal; MOTOR FUNCTION:  moving all extremities equally, motor strength 5/5 throughout. Speech is fluent/normal. CN 2-12 intact except for some hearing loss.    Non-Invasive Vascular Imaging: DATE: 03/30/2014 ABI: RIGHT 0.97, Waveforms: tri and biphasic; TBI: 0.58  LEFT 0.98, Waveforms: tri and biphasic, TBI: 0.58   ASSESSMENT: [name] is a 68 y.o. male who is status post a right axilla femoral and right to left femoral femoral bypass graft in March 2012.  Non tender right flank to palpation, he has mild pain in right ribs for the last 6 months which is likely  costochondral secondary to his chronic cough. Bilateral LE ABI's are normal  PLAN:  I discussed in depth with the patient the nature of atherosclerosis, and emphasized the importance of maximal medical management including strict control of blood pressure, blood glucose, and lipid levels, obtaining regular exercise, and continued cessation of smoking.  The patient is aware that without maximal medical management the underlying atherosclerotic disease process will progress, limiting the benefit of any interventions.  Based on the patient's vascular studies and examination, and after conferring with Dr. Hart Rochester,  pt will return to clinic in 1 year for ABI's, and duplexing of the axillofemoral and right to left femoral-femoral bypass grafts.   The patient was given information about PAD including signs, symptoms, treatment, what symptoms should prompt the patient to seek immediate medical care, and risk reduction measures to take.  Charisse March, RN, MSN, FNP-C [facility] Phone: [phone]  Clinic MD: Myra Gianotti  03/30/2014 10:52 AM

## 2014-03-31 NOTE — Addendum Note (Signed)
Addended by: Lorin Mercy K on: 03/31/2014 01:00 PM   Modules accepted: Orders

## 2014-04-01 ENCOUNTER — Other Ambulatory Visit: Payer: Self-pay | Admitting: *Deleted

## 2014-04-01 DIAGNOSIS — I739 Peripheral vascular disease, unspecified: Secondary | ICD-10-CM

## 2014-04-01 DIAGNOSIS — Z48812 Encounter for surgical aftercare following surgery on the circulatory system: Secondary | ICD-10-CM

## 2014-05-08 ENCOUNTER — Other Ambulatory Visit: Payer: Self-pay | Admitting: Cardiovascular Disease

## 2014-05-19 ENCOUNTER — Encounter: Payer: Self-pay | Admitting: Cardiovascular Disease

## 2014-05-19 ENCOUNTER — Ambulatory Visit (INDEPENDENT_AMBULATORY_CARE_PROVIDER_SITE_OTHER): Payer: 59 | Admitting: Cardiovascular Disease

## 2014-05-19 VITALS — BP 123/73 | HR 87 | Ht 63.0 in | Wt 188.0 lb

## 2014-05-19 DIAGNOSIS — R600 Localized edema: Secondary | ICD-10-CM

## 2014-05-19 DIAGNOSIS — I10 Essential (primary) hypertension: Secondary | ICD-10-CM

## 2014-05-19 DIAGNOSIS — Z720 Tobacco use: Secondary | ICD-10-CM

## 2014-05-19 DIAGNOSIS — I2510 Atherosclerotic heart disease of native coronary artery without angina pectoris: Secondary | ICD-10-CM

## 2014-05-19 DIAGNOSIS — R0602 Shortness of breath: Secondary | ICD-10-CM

## 2014-05-19 DIAGNOSIS — I739 Peripheral vascular disease, unspecified: Secondary | ICD-10-CM

## 2014-05-19 DIAGNOSIS — J438 Other emphysema: Secondary | ICD-10-CM

## 2014-05-19 DIAGNOSIS — I779 Disorder of arteries and arterioles, unspecified: Secondary | ICD-10-CM

## 2014-05-19 DIAGNOSIS — E785 Hyperlipidemia, unspecified: Secondary | ICD-10-CM

## 2014-05-19 MED ORDER — HYDROCHLOROTHIAZIDE 25 MG PO TABS: 25.0000 mg | ORAL_TABLET | Freq: Every day | ORAL | Status: DC

## 2014-05-19 NOTE — Progress Notes (Signed)
Patient ID: [name], male   DOB: 1946/05/11, 68 y.o.   MRN: [id number]      SUBJECTIVE: The patient is a 68 year old male here to followup for coronary artery disease, malignant hypertension, and hyperlipidemia, as well as carotid artery disease. He also has a long-standing history of tobacco abuse and COPD, and chronic dyspnea related to this. His most recent lipid panel was on 12/29/2013 which revealed a total cholesterol of 120, triglycerides 79, HDL 47, and LDL 57.  He was hospitalized this past summer for chest pain and ruled out for an acute coronary syndrome with normal serial troponins.  An echocardiogram was performed on 12/29/13 was a very technically difficult study with poor sound transmission. It appeared to demonstrate normal left ventricular systolic function, EF 50-55%, mild LVH, and grade 1 diastolic dysfunction with elevated filling pressures. He then underwent a dobutamine Cardiolite stress test which demonstrated a moderate degree of myocardial scar in the inferior wall extending from the apex to the base, with no evidence of ischemia. Calculated LVEF was 60%.  He denies chest pain and feels that the Isordil I prescribed at his last visit in July has helped. He has chronic dyspnea with exertion due to his COPD which has not gotten any worse. He does have bilateral leg swelling left moreso than right. He denies dizziness and syncope.   Review of Systems: As per "subjective", otherwise negative.  No Known Allergies  Current Outpatient Prescriptions  Medication Sig Dispense Refill  . albuterol (PROAIR HFA) 108 (90 BASE) MCG/ACT inhaler Inhale 2 puffs into the lungs every 6 (six) hours as needed for shortness of breath.     Marland Kitchen. albuterol (PROVENTIL) (2.5 MG/3ML) 0.083% nebulizer solution Take 2.5 mg by nebulization every 6 (six) hours as needed for shortness of breath.     Marland Kitchen. amLODipine (NORVASC) 10 MG tablet Take 1 tablet (10 mg total) by mouth daily. 30 tablet 6  . aspirin  EC 81 MG tablet Take 1 tablet (81 mg total) by mouth daily.    . Fluticasone-Salmeterol (ADVAIR DISKUS) 500-50 MCG/DOSE AEPB Inhale 1 puff into the lungs 2 (two) times daily as needed (shortness of breath).     . hydrochlorothiazide (MICROZIDE) 12.5 MG capsule TAKE 1 CAPSULE BY MOUTH EVERY DAY 30 capsule 6  . isosorbide dinitrate (ISORDIL) 10 MG tablet Take 1 tablet (10 mg total) by mouth 3 (three) times daily. 90 tablet 6  . lisinopril (PRINIVIL,ZESTRIL) 20 MG tablet Take 1 tablet (20 mg total) by mouth 2 (two) times daily. 60 tablet 1  . nitroGLYCERIN (NITROSTAT) 0.4 MG SL tablet Place 1 tablet (0.4 mg total) under the tongue every 5 (five) minutes as needed for chest pain. 25 tablet 3  . predniSONE (DELTASONE) 10 MG tablet as needed.    . simvastatin (ZOCOR) 20 MG tablet TAKE 1 TABLET BY MOUTH AT BEDTIME 30 tablet 6  . tiotropium (SPIRIVA) 18 MCG inhalation capsule Place 18 mcg into inhaler and inhale daily.     No current facility-administered medications for this visit.    Past Medical History  Diagnosis Date  . COPD (chronic obstructive pulmonary disease)   . Essential hypertension, benign   . Peripheral arterial disease   . NSTEMI (non-ST elevated myocardial infarction)     12/12  . Carotid artery disease     50-69% RICA and less than 50% LICA, 07/2011  . Coronary atherosclerosis of native coronary artery     Based on abnormal Myoview - inferior scar, 05/2011; LVEF  45-50%, echo, 07/2011  . Tobacco abuse   . HLD (hyperlipidemia)     Past Surgical History  Procedure Laterality Date  . Right hydrocele repair    . Right axillary to femoral bypass  3/12    Dr. Hart RochesterLawson  . Right to left femoral to femoral bypass  3/12    Dr. Hart RochesterLawson  . Sigmoid resection / rectopexy  2005    History   Social History  . Marital Status: Divorced    Spouse Name: N/A    Number of Children: N/A  . Years of Education: N/A   Occupational History  . Not on file.   Social History Main Topics  .  Smoking status: Former Smoker -- 2.00 packs/day for 55 years    Types: Cigarettes    Start date: 12/03/1959    Quit date: 12/09/2010  . Smokeless tobacco: Never Used     Comment: Patient has not somked since midnight on wednesday.   . Alcohol Use: No  . Drug Use: No  . Sexual Activity: Not on file   Other Topics Concern  . Not on file   Social History Narrative     Filed Vitals:   05/19/14 1405  BP: 123/73  Pulse: 87  Height: 5\' 3"  (1.6 m)  Weight: 188 lb (85.276 kg)  SpO2: 91%    PHYSICAL EXAM General: NAD  Neck: No JVD, no thyromegaly.  Lungs: Diminished with faint end-expiratory wheezes, no rales. CV: Nondisplaced PMI. Distant tones, but regular rate and rhythm, normal S1/S2, no S3/S4, soft 1/6 systolic murmur over RUSB. 1+ pitting pretibial edema left, trace-1+ edema of right LE.  Abdomen: Soft, nontender, no hepatosplenomegaly, no distention.  Neurologic: Alert and oriented x 3.  Psych: Normal affect.  Skin: Normal. Musculoskeletal: Normal range of motion, no gross deformities. Extremities: No clubbing or cyanosis.   ECG: Most recent ECG reviewed.      ASSESSMENT AND PLAN: 1. Dyspnea on exertion: This is stable. It is chronic and related to his COPD. Dobutamine Cardiolite stress test did not demonstrate any evidence of ischemia. 2. Malignant HTN: Controlled, with no changes to therapy indicated. Continue current therapy with amlodipine 10 mg daily, hydrochlorothiazide (which I will increase to 25 mg daily for leg swelling), and lisinopril 20 mg twice daily. 3. Hyperlipidemia: Well controlled with results noted above dated 12/29/13.  Taking simvastatin 20 mg. No changes. 4. CAD: Stable ischemic heart disease. Echocardiogram results noted above. Regional wall motion was difficult to assess. Dobutamine Cardiolite stress test did not demonstrate any evidence of ischemia. Continue ASA, Isordil, and simvastatin. 5. PVD: [place] up with [facility] in [place].  6.  Bilateral leg swelling: Will increase HCTZ to 25 mg daily and check BMET in 5 days.  Dispo: f/u 6 months.   Prentice DockerSuresh Jazzlin Clements, M.D., F.A.C.C.

## 2014-05-19 NOTE — Patient Instructions (Signed)
   Increase HCTZ to 25mg  daily - new sent to pharm Continue all other medications.   Lab for BMET - due in 5 days  Office will contact with results via phone or letter.   Your physician wants you to follow up in: 6 months.  You will receive a reminder letter in the mail one-two months in advance.  If you don't receive a letter, please call our office to schedule the follow up appointment

## 2014-05-28 ENCOUNTER — Other Ambulatory Visit: Payer: Self-pay | Admitting: Cardiovascular Disease

## 2014-06-30 ENCOUNTER — Encounter: Payer: Self-pay | Admitting: *Deleted

## 2014-08-11 ENCOUNTER — Encounter: Payer: Self-pay | Admitting: *Deleted

## 2014-08-26 ENCOUNTER — Other Ambulatory Visit: Payer: Self-pay | Admitting: Cardiovascular Disease

## 2014-11-24 ENCOUNTER — Ambulatory Visit (INDEPENDENT_AMBULATORY_CARE_PROVIDER_SITE_OTHER): Payer: Medicare Other | Admitting: Cardiovascular Disease

## 2014-11-24 ENCOUNTER — Encounter: Payer: Self-pay | Admitting: Cardiovascular Disease

## 2014-11-24 VITALS — BP 138/78 | HR 95 | Ht 63.0 in | Wt 189.0 lb

## 2014-11-24 DIAGNOSIS — E785 Hyperlipidemia, unspecified: Secondary | ICD-10-CM

## 2014-11-24 DIAGNOSIS — I2510 Atherosclerotic heart disease of native coronary artery without angina pectoris: Secondary | ICD-10-CM | POA: Diagnosis not present

## 2014-11-24 DIAGNOSIS — I10 Essential (primary) hypertension: Secondary | ICD-10-CM

## 2014-11-24 DIAGNOSIS — Z720 Tobacco use: Secondary | ICD-10-CM

## 2014-11-24 DIAGNOSIS — R600 Localized edema: Secondary | ICD-10-CM

## 2014-11-24 DIAGNOSIS — I739 Peripheral vascular disease, unspecified: Secondary | ICD-10-CM

## 2014-11-24 DIAGNOSIS — J438 Other emphysema: Secondary | ICD-10-CM

## 2014-11-24 DIAGNOSIS — I779 Disorder of arteries and arterioles, unspecified: Secondary | ICD-10-CM

## 2014-11-24 MED ORDER — NITROGLYCERIN 0.4 MG SL SUBL: 0.4000 mg | SUBLINGUAL_TABLET | SUBLINGUAL | Status: AC | PRN

## 2014-11-24 NOTE — Patient Instructions (Signed)
   Lab for Lipids - Reminder:  Nothing to eat or drink after 12 midnight prior to labs. Office will contact with results via phone or letter.   Continue all current medications. Your physician wants you to follow up in: 6 months.  You will receive a reminder letter in the mail one-two months in advance.  If you don't receive a letter, please call our office to schedule the follow up appointment   

## 2014-11-24 NOTE — Progress Notes (Signed)
Patient ID: [name], male   DOB: Sep 02, 1945, 69 y.o.   MRN: [id number]      SUBJECTIVE: The patient presents for routine follow-up for coronary artery disease, malignant hypertension, and hyperlipidemia, as well as carotid artery disease. He also has a long-standing history of tobacco abuse and COPD, and chronic dyspnea related to this. His most recent lipid panel was on 12/29/2013 which revealed a total cholesterol of 120, triglycerides 79, HDL 47, and LDL 57.  He was hospitalized in the summer of 2015 for chest pain and ruled out for an acute coronary syndrome with normal serial troponins.  An echocardiogram was performed on 12/29/13 and was a very technically difficult study with poor sound transmission. It appeared to demonstrate normal left ventricular systolic function, EF 50-55%, mild LVH, and grade 1 diastolic dysfunction with elevated filling pressures. He then underwent a dobutamine Cardiolite stress test which demonstrated a moderate degree of myocardial scar in the inferior wall extending from the apex to the base, with no evidence of ischemia. Calculated LVEF was 60%.  He has chronic dyspnea with exertion due to his COPD which has not gotten any worse. Denies chest pain and palpitations, and says leg swelling is under control.  No longer on amlodipine for unclear reasons, but BP controlled.   Review of Systems: As per "subjective", otherwise negative.  No Known Allergies  Current Outpatient Prescriptions  Medication Sig Dispense Refill  . ADVAIR DISKUS 250-50 MCG/DOSE AEPB Inhale 1 puff into the lungs 2 (two) times daily.    Marland Kitchen. albuterol (PROAIR HFA) 108 (90 BASE) MCG/ACT inhaler Inhale 2 puffs into the lungs every 6 (six) hours as needed for shortness of breath.     Marland Kitchen. albuterol (PROVENTIL) (2.5 MG/3ML) 0.083% nebulizer solution Take 2.5 mg by nebulization every 6 (six) hours as needed for shortness of breath.     Marland Kitchen. aspirin EC 81 MG tablet Take 1 tablet (81 mg total) by  mouth daily.    . hydrochlorothiazide (HYDRODIURIL) 25 MG tablet Take 1 tablet (25 mg total) by mouth daily. 30 tablet 6  . isosorbide dinitrate (ISORDIL) 10 MG tablet TAKE 1 TABLET BY MOUTH THREE TIMES DAILY 90 tablet 3  . lisinopril (PRINIVIL,ZESTRIL) 20 MG tablet Take 1 tablet (20 mg total) by mouth 2 (two) times daily. 60 tablet 6  . nitroGLYCERIN (NITROSTAT) 0.4 MG SL tablet Place 0.4 mg under the tongue every 5 (five) minutes x 3 doses as needed for chest pain.    . simvastatin (ZOCOR) 20 MG tablet TAKE 1 TABLET BY MOUTH AT BEDTIME 30 tablet 6  . tiotropium (SPIRIVA) 18 MCG inhalation capsule Place 18 mcg into inhaler and inhale daily.     No current facility-administered medications for this visit.    Past Medical History  Diagnosis Date  . COPD (chronic obstructive pulmonary disease)   . Essential hypertension, benign   . Peripheral arterial disease   . NSTEMI (non-ST elevated myocardial infarction)     12/12  . Carotid artery disease     50-69% RICA and less than 50% LICA, 07/2011  . Coronary atherosclerosis of native coronary artery     Based on abnormal Myoview - inferior scar, 05/2011; LVEF 45-50%, echo, 07/2011  . Tobacco abuse   . HLD (hyperlipidemia)     Past Surgical History  Procedure Laterality Date  . Right hydrocele repair    . Right axillary to femoral bypass  3/12    Dr. Hart RochesterLawson  . Right to left femoral to  femoral bypass  3/12    Dr. Hart RochesterLawson  . Sigmoid resection / rectopexy  2005    History   Social History  . Marital Status: Divorced    Spouse Name: N/A  . Number of Children: N/A  . Years of Education: N/A   Occupational History  . Not on file.   Social History Main Topics  . Smoking status: Former Smoker -- 2.00 packs/day for 55 years    Types: Cigarettes    Start date: 12/03/1959    Quit date: 12/09/2010  . Smokeless tobacco: Never Used     Comment: Patient has not somked since midnight on wednesday.   . Alcohol Use: No  . Drug Use: No  .  Sexual Activity: Not on file   Other Topics Concern  . Not on file   Social History Narrative     Filed Vitals:   11/24/14 0845  BP: 138/78  Pulse: 95  Height: 5\' 3"  (1.6 m)  Weight: 189 lb (85.73 kg)  SpO2: 93%    PHYSICAL EXAM General: NAD  Neck: No JVD, no thyromegaly.  Lungs: Diminished with faint end-expiratory wheezes, no rales. CV: Nondisplaced PMI. Distant tones, but regular rate and rhythm, normal S1/S2, no S3/S4, soft 1/6 systolic murmur over RUSB. Trace bilateral pretibial edema. Abdomen: Obese, no distention.  Neurologic: Alert and oriented x 3.  Psych: Normal affect.  Skin: Normal. Musculoskeletal: No gross deformities. Extremities: No clubbing or cyanosis.   ECG: Most recent ECG reviewed.      ASSESSMENT AND PLAN: 1. Dyspnea on exertion: This is stable. It is chronic and related to his COPD. Dobutamine Cardiolite stress test in 12/2013 did not demonstrate any evidence of ischemia.  2. Malignant HTN: Controlled, with no changes to therapy indicated. Continue current therapy with hydrochlorothiazide and lisinopril 20 mg twice daily. No longer on amlodipine for unclear reasons.  3. Hyperlipidemia: Well controlled with results noted above dated 12/29/13. Taking simvastatin 20 mg. Will check lipids.  4. CAD: Stable ischemic heart disease. Echocardiogram results from 12/2013 noted above. Regional wall motion was difficult to assess. Dobutamine Cardiolite stress test from 12/2013 did not demonstrate any evidence of ischemia. Continue ASA, Isordil, and simvastatin.  5. PVD: [place] with [facility] [place].   6. Bilateral leg swelling: Stable on current diuretic regimen. No changes.  Dispo: f/u 6 months.   Prentice DockerSuresh Manuela Halbur, M.D., F.A.C.C.

## 2014-12-03 ENCOUNTER — Other Ambulatory Visit: Payer: Self-pay | Admitting: Cardiovascular Disease

## 2014-12-30 ENCOUNTER — Other Ambulatory Visit: Payer: Self-pay | Admitting: Cardiovascular Disease

## 2015-02-08 ENCOUNTER — Encounter: Payer: Self-pay | Admitting: *Deleted

## 2015-02-09 ENCOUNTER — Encounter: Payer: Self-pay | Admitting: *Deleted

## 2015-04-05 ENCOUNTER — Encounter: Payer: Self-pay | Admitting: Family

## 2015-04-06 ENCOUNTER — Encounter (HOSPITAL_COMMUNITY): Payer: Medicare Other

## 2015-04-06 ENCOUNTER — Ambulatory Visit: Payer: Medicare Other | Admitting: Family

## 2015-05-11 ENCOUNTER — Encounter: Payer: Self-pay | Admitting: Family

## 2015-05-13 ENCOUNTER — Other Ambulatory Visit: Payer: Self-pay

## 2015-05-13 DIAGNOSIS — I739 Peripheral vascular disease, unspecified: Secondary | ICD-10-CM

## 2015-05-14 ENCOUNTER — Ambulatory Visit (HOSPITAL_COMMUNITY)
Admission: RE | Admit: 2015-05-14 | Discharge: 2015-05-14 | Disposition: A | Discharge status: Home / Self Care | Payer: Medicare Other | Source: Ambulatory Visit | Attending: Family | Admitting: Family

## 2015-05-14 ENCOUNTER — Ambulatory Visit (INDEPENDENT_AMBULATORY_CARE_PROVIDER_SITE_OTHER)
Admission: RE | Admit: 2015-05-14 | Discharge: 2015-05-14 | Disposition: A | Discharge status: Home / Self Care | Payer: Medicare Other | Source: Ambulatory Visit | Attending: Family | Admitting: Family

## 2015-05-14 ENCOUNTER — Ambulatory Visit (INDEPENDENT_AMBULATORY_CARE_PROVIDER_SITE_OTHER): Payer: Medicare Other | Admitting: Family

## 2015-05-14 ENCOUNTER — Encounter: Payer: Self-pay | Admitting: Family

## 2015-05-14 VITALS — BP 130/73 | HR 77 | Ht 63.0 in | Wt 176.2 lb

## 2015-05-14 DIAGNOSIS — I739 Peripheral vascular disease, unspecified: Secondary | ICD-10-CM

## 2015-05-14 DIAGNOSIS — Z87891 Personal history of nicotine dependence: Secondary | ICD-10-CM

## 2015-05-14 DIAGNOSIS — I6523 Occlusion and stenosis of bilateral carotid arteries: Secondary | ICD-10-CM | POA: Diagnosis not present

## 2015-05-14 DIAGNOSIS — Z48812 Encounter for surgical aftercare following surgery on the circulatory system: Secondary | ICD-10-CM

## 2015-05-14 NOTE — Patient Instructions (Addendum)
Peripheral Vascular Disease Peripheral vascular disease (PVD) is a disease of the blood vessels that are not part of your heart and brain. A simple term for PVD is poor circulation. In most cases, PVD narrows the blood vessels that carry blood from your heart to the rest of your body. This can result in a decreased supply of blood to your arms, legs, and internal organs, like your stomach or kidneys. However, it most often affects a person's lower legs and feet. There are two types of PVD.  Organic PVD. This is the more common type. It is caused by damage to the structure of blood vessels.  Functional PVD. This is caused by conditions that make blood vessels contract and tighten (spasm). Without treatment, PVD tends to get worse over time. PVD can also lead to acute ischemic limb. This is when an arm or limb suddenly has trouble getting enough blood. This is a medical emergency. CAUSES Each type of PVD has many different causes. The most common cause of PVD is buildup of a fatty material (plaque) inside of your arteries (atherosclerosis). Small amounts of plaque can break off from the walls of the blood vessels and become lodged in a smaller artery. This blocks blood flow and can cause acute ischemic limb. Other common causes of PVD include:  Blood clots that form inside of blood vessels.  Injuries to blood vessels.  Diseases that cause inflammation of blood vessels or cause blood vessel spasms.  Health behaviors and health history that increase your risk of developing PVD. RISK FACTORS  You may have a greater risk of PVD if you:  Have a family history of PVD.  Have certain medical conditions, including:  High cholesterol.  Diabetes.  High blood pressure (hypertension).  Coronary heart disease.  Past problems with blood clots.  Past injury, such as burns or a broken bone. These may have damaged blood vessels in your limbs.  Buerger disease. This is caused by inflamed blood  vessels in your hands and feet.  Some forms of arthritis.  Rare birth defects that affect the arteries in your legs.  Use tobacco.  Do not get enough exercise.  Are obese.  Are age 50 or older. SIGNS AND SYMPTOMS  PVD may cause many different symptoms. Your symptoms depend on what part of your body is not getting enough blood. Some common signs and symptoms include:  Cramps in your lower legs. This may be a symptom of poor leg circulation (claudication).  Pain and weakness in your legs while you are physically active that goes away when you rest (intermittent claudication).  Leg pain when at rest.  Leg numbness, tingling, or weakness.  Coldness in a leg or foot, especially when compared with the other leg.  Skin or hair changes. These can include:  Hair loss.  Shiny skin.  Pale or bluish skin.  Thick toenails.  Inability to get or maintain an erection (erectile dysfunction). People with PVD are more prone to developing ulcers and sores on their toes, feet, or legs. These may take longer than normal to heal. DIAGNOSIS Your health care provider may diagnose PVD from your signs and symptoms. The health care provider will also do a physical exam. You may have tests to find out what is causing your PVD and determine its severity. Tests may include:  Blood pressure recordings from your arms and legs and measurements of the strength of your pulses (pulse volume recordings).  Imaging studies using sound waves to take pictures of   the blood flow through your blood vessels (Doppler ultrasound).  Injecting a dye into your blood vessels before having imaging studies using:  X-rays (angiogram or arteriogram).  Computer-generated X-rays (CT angiogram).  A powerful electromagnetic field and a computer (magnetic resonance angiogram or MRA). TREATMENT Treatment for PVD depends on the cause of your condition and the severity of your symptoms. It also depends on your age. Underlying  causes need to be treated and controlled. These include long-lasting (chronic) conditions, such as diabetes, high cholesterol, and high blood pressure. You may need to first try making lifestyle changes and taking medicines. Surgery may be needed if these do not work. Lifestyle changes may include:  Quitting smoking.  Exercising regularly.  Following a low-fat, low-cholesterol diet. Medicines may include:  Blood thinners to prevent blood clots.  Medicines to improve blood flow.  Medicines to improve your blood cholesterol levels. Surgical procedures may include:  A procedure that uses an inflated balloon to open a blocked artery and improve blood flow (angioplasty).  A procedure to put in a tube (stent) to keep a blocked artery open (stent implant).  Surgery to reroute blood flow around a blocked artery (peripheral bypass surgery).  Surgery to remove dead tissue from an infected wound on the affected limb.  Amputation. This is surgical removal of the affected limb. This may be necessary in cases of acute ischemic limb that are not improved through medical or surgical treatments. HOME CARE INSTRUCTIONS  Take medicines only as directed by your health care provider.  Do not use any tobacco products, including cigarettes, chewing tobacco, or electronic cigarettes. If you need help quitting, ask your health care provider.  Lose weight if you are overweight, and maintain a healthy weight as directed by your health care provider.  Eat a diet that is low in fat and cholesterol. If you need help, ask your health care provider.  Exercise regularly. Ask your health care provider to suggest some good activities for you.  Use compression stockings or other mechanical devices as directed by your health care provider.  Take good care of your feet.  Wear comfortable shoes that fit well.  Check your feet often for any cuts or sores. SEEK MEDICAL CARE IF:  You have cramps in your legs  while walking.  You have leg pain when you are at rest.  You have coldness in a leg or foot.  Your skin changes.  You have erectile dysfunction.  You have cuts or sores on your feet that are not healing. SEEK IMMEDIATE MEDICAL CARE IF:  Your arm or leg turns cold and blue.  Your arms or legs become red, warm, swollen, painful, or numb.  You have chest pain or trouble breathing.  You suddenly have weakness in your face, arm, or leg.  You become very confused or lose the ability to speak.  You suddenly have a very bad headache or lose your vision.   This information is not intended to replace advice given to you by your health care provider. Make sure you discuss any questions you have with your health care provider.   Document Released: 08/03/2004 Document Revised: 07/17/2014 Document Reviewed: 12/04/2013 Elsevier Interactive Patient Education 2016 [employer]    Stroke Prevention Some medical conditions and behaviors are associated with an increased chance of having a stroke. You may prevent a stroke by making healthy choices and managing medical conditions. HOW CAN I REDUCE MY RISK OF HAVING A STROKE?   Stay physically active. Get at   least 30 minutes of activity on most or all days.  Do not smoke. It may also be helpful to avoid exposure to secondhand smoke.  Limit alcohol use. Moderate alcohol use is considered to be:  No more than 2 drinks per day for men.  No more than 1 drink per day for nonpregnant women.  Eat healthy foods. This involves:  Eating 5 or more servings of fruits and vegetables a day.  Making dietary changes that address high blood pressure (hypertension), high cholesterol, diabetes, or obesity.  Manage your cholesterol levels.  Making food choices that are high in fiber and low in saturated fat, trans fat, and cholesterol may control cholesterol levels.  Take any prescribed medicines to control cholesterol as directed by your health care  provider.  Manage your diabetes.  Controlling your carbohydrate and sugar intake is recommended to manage diabetes.  Take any prescribed medicines to control diabetes as directed by your health care provider.  Control your hypertension.  Making food choices that are low in salt (sodium), saturated fat, trans fat, and cholesterol is recommended to manage hypertension.  Ask your health care provider if you need treatment to lower your blood pressure. Take any prescribed medicines to control hypertension as directed by your health care provider.  If you are 18-39 years of age, have your blood pressure checked every 3-5 years. If you are 40 years of age or older, have your blood pressure checked every year.  Maintain a healthy weight.  Reducing calorie intake and making food choices that are low in sodium, saturated fat, trans fat, and cholesterol are recommended to manage weight.  Stop drug abuse.  Avoid taking birth control pills.  Talk to your health care provider about the risks of taking birth control pills if you are over 35 years old, smoke, get migraines, or have ever had a blood clot.  Get evaluated for sleep disorders (sleep apnea).  Talk to your health care provider about getting a sleep evaluation if you snore a lot or have excessive sleepiness.  Take medicines only as directed by your health care provider.  For some people, aspirin or blood thinners (anticoagulants) are helpful in reducing the risk of forming abnormal blood clots that can lead to stroke. If you have the irregular heart rhythm of atrial fibrillation, you should be on a blood thinner unless there is a good reason you cannot take them.  Understand all your medicine instructions.  Make sure that other conditions (such as anemia or atherosclerosis) are addressed. SEEK IMMEDIATE MEDICAL CARE IF:   You have sudden weakness or numbness of the face, arm, or leg, especially on one side of the body.  Your face  or eyelid droops to one side.  You have sudden confusion.  You have trouble speaking (aphasia) or understanding.  You have sudden trouble seeing in one or both eyes.  You have sudden trouble walking.  You have dizziness.  You have a loss of balance or coordination.  You have a sudden, severe headache with no known cause.  You have new chest pain or an irregular heartbeat. Any of these symptoms may represent a serious problem that is an emergency. Do not wait to see if the symptoms will go away. Get medical help at once. Call your local emergency services (911 in [place] Do not drive yourself to the hospital.   This information is not intended to replace advice given to you by your health care provider. Make sure you discuss any questions   you have with your health care provider.   Document Released: 08/03/2004 Document Revised: 07/17/2014 Document Reviewed: 12/27/2012 Elsevier Interactive Patient Education 2016 Elsevier Inc.  

## 2015-05-14 NOTE — Progress Notes (Signed)
[facility] [place] HISTORY AND PHYSICAL -PAD  History of Present Illness [name] is a 69 y.o. male patient of Dr. Hart Rochester who is status post a right axilla femoral and right to left femoral femoral bypass graft in March 2012.  He returns today for follow up. The patient denies any signs of claudication or rest pain, denies non healing wounds. Patient has no ulcerations on his lower extremities. The patient does state that he can ambulate now without any difficulty although he does have some slight numbness in his right foot since the surgery but nothing that is concerned about.  He states that he has known wheezing in his lungs and keeps a chronic cough. He has 2 abdominal incisional hernias that do not bother him.  Pt states he was told that he had a stroke the same time as his MI in 2012, but he does not recall any stroke or TIA symptoms. Only carotid duplex result on file was done in 07/12/11: 50-69% right ICA stenosis and <50% left ICA stenosis.   The patient denies New Medical or Surgical History.  Pt Diabetic: No Pt smoker: former smoker, quit in 2012 States he quit all ETOH use in 2012.  Pt meds include: Statin :Yes ASA: Yes Other anticoagulants/antiplatelets: no    Past Medical History  Diagnosis Date  . COPD (chronic obstructive pulmonary disease) (HCC)   . Essential hypertension, benign   . Peripheral arterial disease (HCC)   . NSTEMI (non-ST elevated myocardial infarction) (HCC)     12/12  . Carotid artery disease (HCC)     50-69% RICA and less than 50% LICA, 07/2011  . Coronary atherosclerosis of native coronary artery     Based on abnormal Myoview - inferior scar, 05/2011; LVEF 45-50%, echo, 07/2011  . Tobacco abuse   . HLD (hyperlipidemia)     Social History Social History  Substance Use Topics  . Smoking status: Former Smoker -- 2.00 packs/day for 55 years    Types: Cigarettes    Start date: 12/03/1959    Quit date: 12/09/2010   . Smokeless tobacco: Never Used     Comment: Patient has not somked since midnight on wednesday.   . Alcohol Use: No    Family History Family History  Problem Relation Age of Onset  . Heart attack Father   . Heart attack Mother     Past Surgical History  Procedure Laterality Date  . Right hydrocele repair    . Right axillary to femoral bypass  3/12    Dr. Hart Rochester  . Right to left femoral to femoral bypass  3/12    Dr. Hart Rochester  . Sigmoid resection / rectopexy  2005    No Known Allergies  Current Outpatient Prescriptions  Medication Sig Dispense Refill  . ADVAIR DISKUS 250-50 MCG/DOSE AEPB Inhale 1 puff into the lungs 2 (two) times daily.    Marland Kitchen albuterol (PROAIR HFA) 108 (90 BASE) MCG/ACT inhaler Inhale 2 puffs into the lungs every 6 (six) hours as needed for shortness of breath.     Marland Kitchen albuterol (PROVENTIL) (2.5 MG/3ML) 0.083% nebulizer solution Take 2.5 mg by nebulization every 6 (six) hours as needed for shortness of breath.     Marland Kitchen aspirin EC 81 MG tablet Take 1 tablet (81 mg total) by mouth daily.    . hydrochlorothiazide (HYDRODIURIL) 25 MG tablet TAKE 1 TABLET BY MOUTH EVERY DAY 30 tablet 6  . isosorbide dinitrate (ISORDIL) 10 MG tablet TAKE 1 TABLET  BY MOUTH THREE TIMES DAILY 90 tablet 3  . lisinopril (PRINIVIL,ZESTRIL) 20 MG tablet TAKE 1 TABLET BY MOUTH TWICE DAILY 60 tablet 6  . nitroGLYCERIN (NITROSTAT) 0.4 MG SL tablet Place 1 tablet (0.4 mg total) under the tongue every 5 (five) minutes x 3 doses as needed for chest pain. 25 tablet 3  . simvastatin (ZOCOR) 20 MG tablet TAKE 1 TABLET BY MOUTH AT BEDTIME 30 tablet 6  . tiotropium (SPIRIVA) 18 MCG inhalation capsule Place 18 mcg into inhaler and inhale daily.     No current facility-administered medications for this visit.    ROS: See HPI for pertinent positives and negatives.   Physical Examination  Filed Vitals:   05/14/15 1054  BP: 130/73  Pulse: 77  Height:  (1.6 m)  Weight: 176 lb 3.2 oz (79.924 kg)   SpO2: 96%   Body mass index is 31.22 kg/(m^2).  General: A&O x 3, WDWN, obese male. Gait: normal Eyes: PERRLA. Pulmonary: Diminished air movement in all fields with no wheezes , + rales in left posterior base, no rhonchi, + chronic moist cough Cardiac: regular rhythm, no detected murmur.     Carotid Bruits Right Left   Negative Negative  Aorta is not palpable. Radial pulses: are 1+ palpable and =   VASCULAR EXAM: Extremities without ischemic changes without Gangrene; without open wounds. Palpable right flank bypass graft, non tender to palpation. Palpable fem-fem graft.      LE Pulses Right Left   FEMORAL not palpable not palpable    POPLITEAL not palpable  not palpable   POSTERIOR TIBIAL not palpable, biphasic by Doppler  not palpable, biiphasic by Doppler    DORSALIS PEDIS  ANTERIOR TIBIAL biphasic by Doppler and faintly palpable  biphasic by Doppler and not palpable    Abdomen: soft, NT, 2 moderate sized reducible incisional hernias. Skin: no rashes, no ulcers noted. Musculoskeletal: no muscle wasting or atrophy. Neurologic: A&O X 3; Appropriate Affect ; SENSATION: normal; MOTOR FUNCTION: moving all extremities equally, motor strength 5/5 throughout. Speech is fluent/normal. CN 2-12 intact except for some hearing loss.         Non-Invasive Vascular Imaging: DATE: 05/14/2015 LOWER EXTREMITY ARTERIAL DUPLEX EVALUATION    INDICATION: Peripheral vascular disease     PREVIOUS INTERVENTION(S): Right axillofemoral and right to left femoral to femoral bypass graft 09/14/2010.    DUPLEX EXAM:     RIGHT  LEFT   Peak Systolic Velocity (cm/s) Ratio (if abnormal) Waveform  Peak Systolic Velocity (cm/s) Ratio (if abnormal) Waveform  156   Inflow Artery      120   Proximal Anastomosis     112   Proximal Graft     67   Mid Graft     115    Distal Graft     99   Distal Anastomosis     155   Outflow Artery     0.95 Today's ABI / TBI 0.96  0.97 Previous ABI / TBI (03/30/2014  ) 0.98    Waveform:    M - Monophasic       B - Biphasic       T - Triphasic  If Ankle Brachial Index (ABI) or Toe Brachial Index (TBI) performed, please see complete report     ADDITIONAL FINDINGS: Right to left femoral to femoral graft is widely patent with the following velocities: Proximal anastomosis: 115cm/s, proximal graft 38 cm/s, mid graft 34 cm/s, distal anastomosis 94 cm/s.    IMPRESSION: Widely  patent right axillofemoral and femoral to femoral bypass grafts without evidence of stenosis.    Compared to the previous exam:  Stable ABIs bilaterally.     ASSESSMENT: Maretta LosBillie R Gaiser is a 69 y.o. male who is status post a right axilla femoral and right to left femoral femoral bypass graft in March 2012.  He has no claudication symptoms in his lower extremities, no signs of ischemia in his lower extremities.  Today's right LE arterial duplex suggests widely patent right axillofemoral and femoral to femoral bypass grafts without evidence of stenosis. Normal and stable bilateral ABI's.   He had a TIA in 2012 along with his MI, does not recall TIA symptoms, no subsequent TIA or stroke; the only carotid duplex on file was from 07/12/11: 50-69% right ICA stenosis and <50% left ICA stenosis.    PLAN:  Based on the patient's vascular studies and examination, pt will return to clinic in 1 year with ABI's and carotid duplex.  I discussed in depth with the patient the nature of atherosclerosis, and emphasized the importance of maximal medical management including strict control of blood pressure, blood glucose, and lipid levels, obtaining regular exercise, and continued cessation of smoking.  The patient is aware that without maximal medical management the underlying  atherosclerotic disease process will progress, limiting the benefit of any interventions.  The patient was given information about PAD including signs, symptoms, treatment, what symptoms should prompt the patient to seek immediate medical care, and risk reduction measures to take.  Charisse MarchSuzanne Kasee Hantz, RN, MSN, FNP-C [facility] [place] Office Phone: [phone]  Clinic MD: Early on call  05/14/2015 11:14 AM

## 2015-06-07 ENCOUNTER — Ambulatory Visit: Payer: Medicare Other | Admitting: Cardiovascular Disease

## 2015-06-07 DIAGNOSIS — R0989 Other specified symptoms and signs involving the circulatory and respiratory systems: Secondary | ICD-10-CM

## 2015-06-09 ENCOUNTER — Ambulatory Visit: Payer: Medicare Other | Admitting: Cardiovascular Disease

## 2015-06-14 ENCOUNTER — Other Ambulatory Visit: Payer: Self-pay | Admitting: Cardiovascular Disease

## 2015-07-26 ENCOUNTER — Other Ambulatory Visit: Payer: Self-pay | Admitting: Cardiovascular Disease

## 2015-09-23 ENCOUNTER — Other Ambulatory Visit: Payer: Self-pay | Admitting: *Deleted

## 2015-09-23 MED ORDER — ISOSORBIDE DINITRATE 10 MG PO TABS: 10.0000 mg | ORAL_TABLET | Freq: Three times a day (TID) | ORAL | Status: DC

## 2015-09-23 MED ORDER — HYDROCHLOROTHIAZIDE 25 MG PO TABS: 25.0000 mg | ORAL_TABLET | Freq: Every day | ORAL | Status: DC

## 2015-10-25 ENCOUNTER — Other Ambulatory Visit: Payer: Self-pay | Admitting: Cardiovascular Disease

## 2015-10-25 ENCOUNTER — Other Ambulatory Visit: Payer: Self-pay | Admitting: *Deleted

## 2015-10-25 MED ORDER — SIMVASTATIN 20 MG PO TABS: 20.0000 mg | ORAL_TABLET | Freq: Every day | ORAL | Status: DC

## 2016-01-21 ENCOUNTER — Other Ambulatory Visit: Payer: Self-pay | Admitting: Cardiovascular Disease

## 2016-03-13 ENCOUNTER — Other Ambulatory Visit: Payer: Self-pay | Admitting: Cardiovascular Disease

## 2016-05-17 ENCOUNTER — Encounter: Payer: Self-pay | Admitting: Family

## 2016-05-19 ENCOUNTER — Ambulatory Visit (INDEPENDENT_AMBULATORY_CARE_PROVIDER_SITE_OTHER): Payer: Medicare Other | Admitting: Family

## 2016-05-19 ENCOUNTER — Ambulatory Visit (HOSPITAL_COMMUNITY)
Admission: RE | Admit: 2016-05-19 | Discharge: 2016-05-19 | Disposition: A | Discharge status: Home / Self Care | Payer: Medicare Other | Source: Ambulatory Visit | Attending: Family | Admitting: Family

## 2016-05-19 ENCOUNTER — Ambulatory Visit (INDEPENDENT_AMBULATORY_CARE_PROVIDER_SITE_OTHER)
Admission: RE | Admit: 2016-05-19 | Discharge: 2016-05-19 | Disposition: A | Discharge status: Home / Self Care | Payer: Medicare Other | Source: Ambulatory Visit | Attending: Family | Admitting: Family

## 2016-05-19 ENCOUNTER — Encounter: Payer: Self-pay | Admitting: Family

## 2016-05-19 VITALS — BP 154/77 | HR 76 | Temp 97.3°F | Ht 63.0 in | Wt 188.0 lb

## 2016-05-19 DIAGNOSIS — Z87891 Personal history of nicotine dependence: Secondary | ICD-10-CM

## 2016-05-19 DIAGNOSIS — I6523 Occlusion and stenosis of bilateral carotid arteries: Secondary | ICD-10-CM | POA: Diagnosis not present

## 2016-05-19 DIAGNOSIS — Z48812 Encounter for surgical aftercare following surgery on the circulatory system: Secondary | ICD-10-CM | POA: Diagnosis not present

## 2016-05-19 DIAGNOSIS — R0989 Other specified symptoms and signs involving the circulatory and respiratory systems: Secondary | ICD-10-CM | POA: Diagnosis present

## 2016-05-19 DIAGNOSIS — I779 Disorder of arteries and arterioles, unspecified: Secondary | ICD-10-CM | POA: Diagnosis not present

## 2016-05-19 DIAGNOSIS — I10 Essential (primary) hypertension: Secondary | ICD-10-CM | POA: Insufficient documentation

## 2016-05-19 DIAGNOSIS — I739 Peripheral vascular disease, unspecified: Secondary | ICD-10-CM

## 2016-05-19 LAB — VAS US CAROTID
LCCADDIAS: -16 cm/s
LEFT ECA DIAS: -13 cm/s
LEFT VERTEBRAL DIAS: 22 cm/s
LICADDIAS: -25 cm/s
LICADSYS: -104 cm/s
LICAPSYS: 67 cm/s
Left CCA dist sys: -96 cm/s
Left ICA prox dias: 13 cm/s
RCCAPSYS: 81 cm/s
RIGHT CCA MID DIAS: 12 cm/s
Right CCA prox dias: 11 cm/s
Right cca dist sys: -107 cm/s

## 2016-05-19 NOTE — Progress Notes (Signed)
[facility] [place] HISTORY AND PHYSICAL   MRN : [id number]  History of Present Illness:   [name] is a 70 y.o. male patient of Dr. Hart RochesterLawson who is status post a right axilla femoral and right to left femoral femoral bypass graft in March 2012.  He returns today for follow up. The patient denies any signs of claudication or rest pain, denies non healing wounds.   After about 10 minutes of walking, sometimes both hips hurt, relieved by rest.   He states that he has known wheezing in his lungs and keeps a chronic cough. He has 2 abdominal incisional hernias that do not bother him.  Pt states he was told that he had a stroke the same time as his MI in 2012, but he does not recall any stroke or TIA symptoms. Only carotid duplex result on file was done in 07/12/11: 50-69% right ICA stenosis and <50% left ICA stenosis.   The patient denies New Medical or Surgical History.  Pt Diabetic: No Pt smoker: former smoker, quit in 2012 States he quit all ETOH use in 2012.  Pt meds include: Statin :Yes ASA: Yes Other anticoagulants/antiplatelets: no    Current Outpatient Prescriptions  Medication Sig Dispense Refill  . ADVAIR DISKUS 250-50 MCG/DOSE AEPB Inhale 1 puff into the lungs 2 (two) times daily.    Marland Kitchen. albuterol (PROAIR HFA) 108 (90 BASE) MCG/ACT inhaler Inhale 2 puffs into the lungs every 6 (six) hours as needed for shortness of breath.     Marland Kitchen. albuterol (PROVENTIL) (2.5 MG/3ML) 0.083% nebulizer solution Take 2.5 mg by nebulization every 6 (six) hours as needed for shortness of breath.     Marland Kitchen. aspirin EC 81 MG tablet Take 1 tablet (81 mg total) by mouth daily.    . hydrochlorothiazide (HYDRODIURIL) 25 MG tablet Take 1 tablet (25 mg total) by mouth daily. 7 tablet 0  . isosorbide dinitrate (ISORDIL) 10 MG tablet Take 1 tablet (10 mg total) by mouth 3 (three) times daily. 21 tablet 0  . lisinopril (PRINIVIL,ZESTRIL) 20 MG tablet TAKE 1 TABLET BY MOUTH TWICE DAILY  60 tablet 6  . nitroGLYCERIN (NITROSTAT) 0.4 MG SL tablet Place 1 tablet (0.4 mg total) under the tongue every 5 (five) minutes x 3 doses as needed for chest pain. 25 tablet 3  . simvastatin (ZOCOR) 20 MG tablet TAKE 1 TABLET BY MOUTH EVERY DAY 10 tablet 0  . tiotropium (SPIRIVA) 18 MCG inhalation capsule Place 18 mcg into inhaler and inhale daily.     No current facility-administered medications for this visit.     Past Medical History:  Diagnosis Date  . Carotid artery disease (HCC)    50-69% RICA and less than 50% LICA, 07/2011  . COPD (chronic obstructive pulmonary disease) (HCC)   . Coronary atherosclerosis of native coronary artery    Based on abnormal Myoview - inferior scar, 05/2011; LVEF 45-50%, echo, 07/2011  . Essential hypertension, benign   . HLD (hyperlipidemia)   . NSTEMI (non-ST elevated myocardial infarction) (HCC)    12/12  . Peripheral arterial disease (HCC)   . Tobacco abuse     Social History Social History  Substance Use Topics  . Smoking status: Former Smoker    Packs/day: 2.00    Years: 55.00    Types: Cigarettes    Start date: 12/03/1959    Quit date: 12/09/2010  . Smokeless tobacco: Never Used     Comment: Patient has not somked since midnight on  wednesday.   . Alcohol use No    Family History Family History  Problem Relation Age of Onset  . Heart attack Father   . Heart attack Mother     Surgical History Past Surgical History:  Procedure Laterality Date  . Right axillary to femoral bypass  3/12   Dr. Hart Rochester  . RIGHT HYDROCELE REPAIR    . Right to left femoral to femoral bypass  3/12   Dr. Hart Rochester  . SIGMOID RESECTION / RECTOPEXY  2005    No Known Allergies  Current Outpatient Prescriptions  Medication Sig Dispense Refill  . ADVAIR DISKUS 250-50 MCG/DOSE AEPB Inhale 1 puff into the lungs 2 (two) times daily.    Marland Kitchen albuterol (PROAIR HFA) 108 (90 BASE) MCG/ACT inhaler Inhale 2 puffs into the lungs every 6 (six) hours as needed for shortness  of breath.     Marland Kitchen albuterol (PROVENTIL) (2.5 MG/3ML) 0.083% nebulizer solution Take 2.5 mg by nebulization every 6 (six) hours as needed for shortness of breath.     Marland Kitchen aspirin EC 81 MG tablet Take 1 tablet (81 mg total) by mouth daily.    . hydrochlorothiazide (HYDRODIURIL) 25 MG tablet Take 1 tablet (25 mg total) by mouth daily. 7 tablet 0  . isosorbide dinitrate (ISORDIL) 10 MG tablet Take 1 tablet (10 mg total) by mouth 3 (three) times daily. 21 tablet 0  . lisinopril (PRINIVIL,ZESTRIL) 20 MG tablet TAKE 1 TABLET BY MOUTH TWICE DAILY 60 tablet 6  . nitroGLYCERIN (NITROSTAT) 0.4 MG SL tablet Place 1 tablet (0.4 mg total) under the tongue every 5 (five) minutes x 3 doses as needed for chest pain. 25 tablet 3  . simvastatin (ZOCOR) 20 MG tablet TAKE 1 TABLET BY MOUTH EVERY DAY 10 tablet 0  . tiotropium (SPIRIVA) 18 MCG inhalation capsule Place 18 mcg into inhaler and inhale daily.     No current facility-administered medications for this visit.      REVIEW OF SYSTEMS: See HPI for pertinent positives and negatives.  Physical Examination Vitals:   05/19/16 1115 05/19/16 1121  BP: (!) 147/80 (!) 154/77  Pulse: 84 76  Temp: 97.3 F (36.3 C)   SpO2: 98%   Weight: 188 lb (85.3 kg)   Height: 5\' 3"  (1.6 m)    Body mass index is 33.3 kg/m.  General:  A&O x 3, WDWN, obese male. Gait: normal Eyes: PERRLA. Pulmonary: Diminished air movement in all fields with no wheezes , no rales, no rhonchi, + chronic moist cough Cardiac: regular rhythm, no detected murmur.     Carotid Bruits Right Left   positive positive  Aorta is not palpable. Radial pulses: are 1+ palpable and =   VASCULAR EXAM: Extremities without ischemic changes without Gangrene; without open wounds. Palpable right flank bypass graft pulse, non tender to palpation. Palpable fem-fem graft pulse.       LE Pulses Right Left   FEMORAL not palpable not palpable    POPLITEAL not palpable  not palpable   POSTERIOR TIBIAL not palpable, biphasic by Doppler  not palpable, biphasic by Doppler    DORSALIS PEDIS  ANTERIOR TIBIAL biphasic by Doppler and faintly palpable  biphasic by Doppler and not palpable    Abdomen: soft, NT, 2 moderate sized reducible incisional hernias. Skin: no rashes, no ulcers noted. Musculoskeletal: no muscle wasting or atrophy. Neurologic: A&O X 3; Appropriate Affect ; SENSATION: normal; MOTOR FUNCTION: moving all extremities equally, motor strength 5/5 throughout. Speech is fluent/normal. CN 2-12 intact except  for some hearing loss.     ASSESSMENT:  [name] is a 70 y.o. male who is status post a right axilla femoral and right to left femoral femoral bypass graft in March 2012.  He has no claudication symptoms in his lower extremities, no signs of ischemia in his lower extremities.   Bilateral carotid bruits present. He had a TIA in 2012 along with his MI, does not recall TIA symptoms, no subsequent TIA or stroke; carotid duplex on file from 07/12/11: 50-69% right ICA stenosis and <50% left ICA stenosis.    Obesity: He admits to drinking too may soft drinks; alternative beverages discussed.   His atherosclerotic risk factors include former smoker, CAD, COPD, and obesity.  DATA Today's carotid suggests <40% bilateral ICA stenosis. Right ECA stenosis.  Bilateral vertebral artery flow is antegrade. Bilateral subclavian artery waveforms are normal.  Compared to carotid duplex on 07/12/11, improved in the right ICA and stable in the left ICA.   Bilateral ABI's are stable,  normal with biphasic waveforms.  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  Based on  today's exam and non-invasive vascular lab results, the patient will follow up in 1 year with the following tests: ABI's. Carotid duplex in 2 years. I discussed in depth with the patient the nature of atherosclerosis, and emphasized the importance of maximal medical management including strict control of blood pressure, blood glucose, and lipid levels, obtaining regular exercise, and cessation of smoking.  The patient is aware that without maximal medical management the underlying atherosclerotic disease process will progress, limiting the benefit of any interventions.  The patient was given information about stroke prevention and what symptoms should prompt the patient to seek immediate medical care.  The patient was given information about PAD including signs, symptoms, treatment, what symptoms should prompt the patient to seek immediate medical care, and risk reduction measures to take. Thank you for allowing us to participate in this patient's care.  Charisse MarchSuzanne Caitlynne Harbeck, RN, MSN, FNP-C [facility] Office: [phone]  Clinic MD: Randie HeinzCain 05/19/2016 11:23 AM

## 2016-05-19 NOTE — Patient Instructions (Signed)
Peripheral Vascular Disease Peripheral vascular disease (PVD) is a disease of the blood vessels that are not part of your heart and brain. A simple term for PVD is poor circulation. In most cases, PVD narrows the blood vessels that carry blood from your heart to the rest of your body. This can result in a decreased supply of blood to your arms, legs, and internal organs, like your stomach or kidneys. However, it most often affects a person's lower legs and feet. There are two types of PVD.  Organic PVD. This is the more common type. It is caused by damage to the structure of blood vessels.  Functional PVD. This is caused by conditions that make blood vessels contract and tighten (spasm). Without treatment, PVD tends to get worse over time. PVD can also lead to acute ischemic limb. This is when an arm or limb suddenly has trouble getting enough blood. This is a medical emergency. CAUSES Each type of PVD has many different causes. The most common cause of PVD is buildup of a fatty material (plaque) inside of your arteries (atherosclerosis). Small amounts of plaque can break off from the walls of the blood vessels and become lodged in a smaller artery. This blocks blood flow and can cause acute ischemic limb. Other common causes of PVD include:  Blood clots that form inside of blood vessels.  Injuries to blood vessels.  Diseases that cause inflammation of blood vessels or cause blood vessel spasms.  Health behaviors and health history that increase your risk of developing PVD. RISK FACTORS  You may have a greater risk of PVD if you:  Have a family history of PVD.  Have certain medical conditions, including:  High cholesterol.  Diabetes.  High blood pressure (hypertension).  Coronary heart disease.  Past problems with blood clots.  Past injury, such as burns or a broken bone. These may have damaged blood vessels in your limbs.  Buerger disease. This is caused by inflamed blood  vessels in your hands and feet.  Some forms of arthritis.  Rare birth defects that affect the arteries in your legs.  Use tobacco.  Do not get enough exercise.  Are obese.  Are age 50 or older. SIGNS AND SYMPTOMS  PVD may cause many different symptoms. Your symptoms depend on what part of your body is not getting enough blood. Some common signs and symptoms include:  Cramps in your lower legs. This may be a symptom of poor leg circulation (claudication).  Pain and weakness in your legs while you are physically active that goes away when you rest (intermittent claudication).  Leg pain when at rest.  Leg numbness, tingling, or weakness.  Coldness in a leg or foot, especially when compared with the other leg.  Skin or hair changes. These can include:  Hair loss.  Shiny skin.  Pale or bluish skin.  Thick toenails.  Inability to get or maintain an erection (erectile dysfunction). People with PVD are more prone to developing ulcers and sores on their toes, feet, or legs. These may take longer than normal to heal. DIAGNOSIS Your health care provider may diagnose PVD from your signs and symptoms. The health care provider will also do a physical exam. You may have tests to find out what is causing your PVD and determine its severity. Tests may include:  Blood pressure recordings from your arms and legs and measurements of the strength of your pulses (pulse volume recordings).  Imaging studies using sound waves to take pictures of   the blood flow through your blood vessels (Doppler ultrasound).  Injecting a dye into your blood vessels before having imaging studies using:  X-rays (angiogram or arteriogram).  Computer-generated X-rays (CT angiogram).  A powerful electromagnetic field and a computer (magnetic resonance angiogram or MRA). TREATMENT Treatment for PVD depends on the cause of your condition and the severity of your symptoms. It also depends on your age. Underlying  causes need to be treated and controlled. These include long-lasting (chronic) conditions, such as diabetes, high cholesterol, and high blood pressure. You may need to first try making lifestyle changes and taking medicines. Surgery may be needed if these do not work. Lifestyle changes may include:  Quitting smoking.  Exercising regularly.  Following a low-fat, low-cholesterol diet. Medicines may include:  Blood thinners to prevent blood clots.  Medicines to improve blood flow.  Medicines to improve your blood cholesterol levels. Surgical procedures may include:  A procedure that uses an inflated balloon to open a blocked artery and improve blood flow (angioplasty).  A procedure to put in a tube (stent) to keep a blocked artery open (stent implant).  Surgery to reroute blood flow around a blocked artery (peripheral bypass surgery).  Surgery to remove dead tissue from an infected wound on the affected limb.  Amputation. This is surgical removal of the affected limb. This may be necessary in cases of acute ischemic limb that are not improved through medical or surgical treatments. HOME CARE INSTRUCTIONS  Take medicines only as directed by your health care provider.  Do not use any tobacco products, including cigarettes, chewing tobacco, or electronic cigarettes. If you need help quitting, ask your health care provider.  Lose weight if you are overweight, and maintain a healthy weight as directed by your health care provider.  Eat a diet that is low in fat and cholesterol. If you need help, ask your health care provider.  Exercise regularly. Ask your health care provider to suggest some good activities for you.  Use compression stockings or other mechanical devices as directed by your health care provider.  Take good care of your feet.  Wear comfortable shoes that fit well.  Check your feet often for any cuts or sores. SEEK MEDICAL CARE IF:  You have cramps in your legs  while walking.  You have leg pain when you are at rest.  You have coldness in a leg or foot.  Your skin changes.  You have erectile dysfunction.  You have cuts or sores on your feet that are not healing. SEEK IMMEDIATE MEDICAL CARE IF:  Your arm or leg turns cold and blue.  Your arms or legs become red, warm, swollen, painful, or numb.  You have chest pain or trouble breathing.  You suddenly have weakness in your face, arm, or leg.  You become very confused or lose the ability to speak.  You suddenly have a very bad headache or lose your vision.   This information is not intended to replace advice given to you by your health care provider. Make sure you discuss any questions you have with your health care provider.   Document Released: 08/03/2004 Document Revised: 07/17/2014 Document Reviewed: 12/04/2013 [employer] Interactive Patient Education 2016 [employer]  

## 2016-05-22 ENCOUNTER — Other Ambulatory Visit: Payer: Self-pay | Admitting: *Deleted

## 2016-05-22 DIAGNOSIS — I739 Peripheral vascular disease, unspecified: Secondary | ICD-10-CM

## 2017-05-21 ENCOUNTER — Ambulatory Visit (INDEPENDENT_AMBULATORY_CARE_PROVIDER_SITE_OTHER): Payer: Medicare Other | Admitting: Family

## 2017-05-21 ENCOUNTER — Ambulatory Visit (HOSPITAL_COMMUNITY)
Admission: RE | Admit: 2017-05-21 | Discharge: 2017-05-21 | Disposition: A | Discharge status: Home / Self Care | Payer: Medicare Other | Source: Ambulatory Visit | Attending: Family | Admitting: Family

## 2017-05-21 ENCOUNTER — Encounter: Payer: Self-pay | Admitting: Family

## 2017-05-21 VITALS — BP 116/71 | HR 98 | Temp 98.2°F | Resp 20 | Ht 63.0 in | Wt 188.0 lb

## 2017-05-21 DIAGNOSIS — I779 Disorder of arteries and arterioles, unspecified: Secondary | ICD-10-CM

## 2017-05-21 DIAGNOSIS — I6523 Occlusion and stenosis of bilateral carotid arteries: Secondary | ICD-10-CM | POA: Diagnosis not present

## 2017-05-21 DIAGNOSIS — Z87891 Personal history of nicotine dependence: Secondary | ICD-10-CM | POA: Diagnosis not present

## 2017-05-21 DIAGNOSIS — I739 Peripheral vascular disease, unspecified: Secondary | ICD-10-CM | POA: Insufficient documentation

## 2017-05-21 DIAGNOSIS — R9439 Abnormal result of other cardiovascular function study: Secondary | ICD-10-CM | POA: Diagnosis not present

## 2017-05-21 NOTE — Progress Notes (Signed)
[facility] [place] HISTORY AND PHYSICAL   CC: Follow up peripheral artery occlusive disease    History of Present Illness:   [name] is a 71 y.o. male who is status post a right axilla femoral and right to left femoral femoral bypass graft in March 2012 by Dr. Hart RochesterLawson.  He returns today for follow up.  After walking about 50 feet his right calf hurts, relieved by sitting. No non healing wounds in his lower extremities.   Left hip hip hurts when he stands, not with sitting, this is worse that when he was here a year ago.   He states that he has known wheezing in his lungs and keeps a chronic cough, but daughter states this is worse in the last couple of days.  He has 2 abdominal incisional hernias that do not bother him.  Pt states he was told that he had a stroke the same time as his MI in 2012, but he does not recall any stroke or TIA symptoms.  Pt Diabetic: No Pt smoker: former smoker, quit in 2012 States he quit all ETOH use in 2012.  Pt meds include: Statin :Yes ASA: Yes Other anticoagulants/antiplatelets: no     Current Outpatient Medications  Medication Sig Dispense Refill  . ADVAIR DISKUS 250-50 MCG/DOSE AEPB Inhale 1 puff into the lungs 2 (two) times daily.    Marland Kitchen. albuterol (PROAIR HFA) 108 (90 BASE) MCG/ACT inhaler Inhale 2 puffs into the lungs every 6 (six) hours as needed for shortness of breath.     Marland Kitchen. albuterol (PROVENTIL) (2.5 MG/3ML) 0.083% nebulizer solution Take 2.5 mg by nebulization every 6 (six) hours as needed for shortness of breath.     Marland Kitchen. aspirin EC 81 MG tablet Take 1 tablet (81 mg total) by mouth daily.    . hydrochlorothiazide (HYDRODIURIL) 25 MG tablet Take 1 tablet (25 mg total) by mouth daily. 7 tablet 0  . isosorbide dinitrate (ISORDIL) 10 MG tablet Take 1 tablet (10 mg total) by mouth 3 (three) times daily. 21 tablet 0  . lisinopril (PRINIVIL,ZESTRIL) 20 MG tablet TAKE 1 TABLET BY MOUTH TWICE DAILY 60 tablet 6  .  nitroGLYCERIN (NITROSTAT) 0.4 MG SL tablet Place 1 tablet (0.4 mg total) under the tongue every 5 (five) minutes x 3 doses as needed for chest pain. 25 tablet 3  . OXYGEN Inhale 4 L into the lungs.    . simvastatin (ZOCOR) 20 MG tablet TAKE 1 TABLET BY MOUTH EVERY DAY 10 tablet 0  . umeclidinium-vilanterol (ANORO ELLIPTA) 62.5-25 MCG/INH AEPB Inhale 1 puff daily into the lungs.    . tiotropium (SPIRIVA) 18 MCG inhalation capsule Place 18 mcg into inhaler and inhale daily.     No current facility-administered medications for this visit.     Past Medical History:  Diagnosis Date  . Carotid artery disease (HCC)    50-69% RICA and less than 50% LICA, 07/2011  . COPD (chronic obstructive pulmonary disease) (HCC)   . Coronary atherosclerosis of native coronary artery    Based on abnormal Myoview - inferior scar, 05/2011; LVEF 45-50%, echo, 07/2011  . Essential hypertension, benign   . HLD (hyperlipidemia)   . NSTEMI (non-ST elevated myocardial infarction) (HCC)    12/12  . Peripheral arterial disease (HCC)   . Tobacco abuse     Social History Social History   Tobacco Use  . Smoking status: Former Smoker    Packs/day: 2.00    Years: 55.00  Pack years: 110.00    Types: Cigarettes    Start date: 12/03/1959    Last attempt to quit: 12/09/2010    Years since quitting: 6.4  . Smokeless tobacco: Never Used  . Tobacco comment: Patient has not somked since midnight on wednesday.   Substance Use Topics  . Alcohol use: No    Alcohol/week: 0.0 oz  . Drug use: No    Family History Family History  Problem Relation Age of Onset  . Heart attack Father   . Heart attack Mother     Surgical History Past Surgical History:  Procedure Laterality Date  . Right axillary to femoral bypass  3/12   Dr. Hart Rochester  . RIGHT HYDROCELE REPAIR    . Right to left femoral to femoral bypass  3/12   Dr. Hart Rochester  . SIGMOID RESECTION / RECTOPEXY  2005    No Known Allergies  Current Outpatient Medications   Medication Sig Dispense Refill  . ADVAIR DISKUS 250-50 MCG/DOSE AEPB Inhale 1 puff into the lungs 2 (two) times daily.    Marland Kitchen albuterol (PROAIR HFA) 108 (90 BASE) MCG/ACT inhaler Inhale 2 puffs into the lungs every 6 (six) hours as needed for shortness of breath.     Marland Kitchen albuterol (PROVENTIL) (2.5 MG/3ML) 0.083% nebulizer solution Take 2.5 mg by nebulization every 6 (six) hours as needed for shortness of breath.     Marland Kitchen aspirin EC 81 MG tablet Take 1 tablet (81 mg total) by mouth daily.    . hydrochlorothiazide (HYDRODIURIL) 25 MG tablet Take 1 tablet (25 mg total) by mouth daily. 7 tablet 0  . isosorbide dinitrate (ISORDIL) 10 MG tablet Take 1 tablet (10 mg total) by mouth 3 (three) times daily. 21 tablet 0  . lisinopril (PRINIVIL,ZESTRIL) 20 MG tablet TAKE 1 TABLET BY MOUTH TWICE DAILY 60 tablet 6  . nitroGLYCERIN (NITROSTAT) 0.4 MG SL tablet Place 1 tablet (0.4 mg total) under the tongue every 5 (five) minutes x 3 doses as needed for chest pain. 25 tablet 3  . OXYGEN Inhale 4 L into the lungs.    . simvastatin (ZOCOR) 20 MG tablet TAKE 1 TABLET BY MOUTH EVERY DAY 10 tablet 0  . umeclidinium-vilanterol (ANORO ELLIPTA) 62.5-25 MCG/INH AEPB Inhale 1 puff daily into the lungs.    . tiotropium (SPIRIVA) 18 MCG inhalation capsule Place 18 mcg into inhaler and inhale daily.     No current facility-administered medications for this visit.      REVIEW OF SYSTEMS: See HPI for pertinent positives and negatives.  Physical Examination Vitals:   05/21/17 1144 05/21/17 1146  BP: (!) 138/91 116/71  Pulse: 98   Resp: 20   Temp: 98.2 F (36.8 C)   TempSrc: Oral   SpO2: 94%   Weight: 188 lb (85.3 kg)   Height: 5\' 3"  (1.6 m)    Body mass index is 33.3 kg/m.  General: A&O x 3, WDWN, obese male. Gait: normal Eyes: PERRLA. Pulmonary: Diminished air movement in all fields with wheezes, rales, and rhonchi, + chronic moist cough, respirations are slightly labored at rest, moderatly labored on  standing Cardiac: regular rhythm, no detected murmur.     Carotid Bruits Right Left   positive positive   Abdominal aortic pulseis not palpable. Radial pulses: are 1+ palpable and =   VASCULAR EXAM: Extremitieswithout ischemic changes without Gangrene; without open wounds. Palpable right flank bypass graft pulse, non tender to palpation. Palpable fem-fem graft pulse.      LE Pulses Right  Left   FEMORAL 2+palpable 1+ palpable    POPLITEAL not palpable  not palpable   POSTERIOR TIBIAL not palpable  not palpable    DORSALIS PEDIS  ANTERIOR TIBIAL not palpable   not palpable    Abdomen: soft, NT, 2 moderate sized reducible asymptomatic incisional hernias. Skin: no rashes, no ulcers noted. Musculoskeletal: no muscle wasting or atrophy. Neurologic: A&O X 3; Appropriate Affect ; SENSATION: normal; MOTOR FUNCTION: moving all extremities equally, motor strength 5/5 throughout except 3/5 in left LE. Speech is fluent/normal. CN 2-12 intact except for some hearing loss.    ASSESSMENT:  DIANGELO RADEL is a 71 y.o. male whois status post a right axilla femoral and right to left femoral femoral bypass graft in March 2012.  He has no claudication symptoms in his lower extremities, no signs of ischemia in his lower extremities. His walking is limited by significant left hip pain.   Bilateral carotid bruits present. He had a TIA in 2012 along with his MI, does not recall TIA symptoms, no subsequent TIA or stroke; carotid duplex on file from 07/12/11: 50-69% right ICA stenosis and <50% left ICA stenosis.    Obesity: He admits to drinking too may soft drinks; alternative beverages discussed.   His atherosclerotic risk factors include former smoker, CAD, COPD, and  obesity.   He has left hip pain that is NOT likely due to lack of arterial perfusion since his left ABI is 95% with biphasic waveforms, left femoral pulse is palpable.  By history obtained today from pt and daughter, he has no hx of left hip problems nor lumbar spine problems, has not had his left hip evaluated by ortho. See Plan.    His cough and dyspnea are worse in the last couple of days; on exam: + rales, rhonchi, and wheezing with little air movement in all fields. Daughter states he is confused as to which inhalers to use and he is using all of them several times per day. See Plan.    DATA   ABI (Date: 05/21/2017):  R:   ABI: 0.84 (was 0.93 on 05-19-16),   PT: bi  DP: bi  TBI:  0.38  L:   ABI: 0.95 (was 0.95),   PT: bi  DP: bi  TBI: 0.33  Mild decline in right LE ABI to 83% from 93%, stable left ABI at 95%.    Carotid Duplex (05/19/16): <40% bilateral ICA stenosis. Right ECA stenosis.  Bilateral vertebral artery flow is antegrade. Bilateral subclavian artery waveforms are normal.  Compared to carotid duplex on 07/12/11, improved in the right ICA and stable in the left ICA.    PLAN:   Referred to ortho to evaluate left hip pain on standing, severely life limiting.   I advised pt and his daughter to go to his PCP's office now re his dyspnea.  Based on today's exam and non-invasive vascular lab results, the patient will follow up in 1 year with the following tests: ABI's. Carotid duplex in 2 years. I advised pt and wife to notify us if he develops concerns re the circulation in his feet or legs.   I discussed in depth with the patient the nature of atherosclerosis, and emphasized the importance of maximal medical management including strict control of blood pressure, blood glucose, and lipid levels, obtaining regular exercise, and cessation of smoking.  The patient is aware that without maximal medical management the underlying atherosclerotic disease process  will progress, limiting the benefit of any interventions.  The patient was given information about stroke prevention and what symptoms should prompt the patient to seek immediate medical care.  The patient was given information about PAD including signs, symptoms, treatment, what symptoms should prompt the patient to seek immediate medical care, and risk reduction measures to take.  Thank you for allowing us to participate in this patient's care.  Tom MarchSuzanne Akia Desroches, RN, MSN, FNP-C [facility] Office: [phone]  Clinic MD: Imogene BurnChen 05/21/2017 11:50 AM

## 2017-05-21 NOTE — Patient Instructions (Signed)
Peripheral Vascular Disease Peripheral vascular disease (PVD) is a disease of the blood vessels that are not part of your heart and brain. A simple term for PVD is poor circulation. In most cases, PVD narrows the blood vessels that carry blood from your heart to the rest of your body. This can result in a decreased supply of blood to your arms, legs, and internal organs, like your stomach or kidneys. However, it most often affects a person's lower legs and feet. There are two types of PVD.  Organic PVD. This is the more common type. It is caused by damage to the structure of blood vessels.  Functional PVD. This is caused by conditions that make blood vessels contract and tighten (spasm).  Without treatment, PVD tends to get worse over time. PVD can also lead to acute ischemic limb. This is when an arm or limb suddenly has trouble getting enough blood. This is a medical emergency. Follow these instructions at home:  Take medicines only as told by your doctor.  Do not use any tobacco products, including cigarettes, chewing tobacco, or electronic cigarettes. If you need help quitting, ask your doctor.  Lose weight if you are overweight, and maintain a healthy weight as told by your doctor.  Eat a diet that is low in fat and cholesterol. If you need help, ask your doctor.  Exercise regularly. Ask your doctor for some good activities for you.  Take good care of your feet. ? Wear comfortable shoes that fit well. ? Check your feet often for any cuts or sores. Contact a doctor if:  You have cramps in your legs while walking.  You have leg pain when you are at rest.  You have coldness in a leg or foot.  Your skin changes.  You are unable to get or have an erection (erectile dysfunction).  You have cuts or sores on your feet that are not healing. Get help right away if:  Your arm or leg turns cold and blue.  Your arms or legs become red, warm, swollen, painful, or numb.  You have  chest pain or trouble breathing.  You suddenly have weakness in your face, arm, or leg.  You become very confused or you cannot speak.  You suddenly have a very bad headache.  You suddenly cannot see. This information is not intended to replace advice given to you by your health care provider. Make sure you discuss any questions you have with your health care provider. Document Released: 09/20/2009 Document Revised: 12/02/2015 Document Reviewed: 12/04/2013 Elsevier Interactive Patient Education  2017 Elsevier Inc.       Stroke Prevention Some health problems and behaviors may make it more likely for you to have a stroke. Below are ways to lessen your risk of having a stroke.  Be active for at least 30 minutes on most or all days.  Do not smoke. Try not to be around others who smoke.  Do not drink too much alcohol. ? Do not have more than 2 drinks a day if you are a man. ? Do not have more than 1 drink a day if you are a woman and are not pregnant.  Eat healthy foods, such as fruits and vegetables. If you were put on a specific diet, follow the diet as told.  Keep your cholesterol levels under control through diet and medicines. Look for foods that are low in saturated fat, trans fat, cholesterol, and are high in fiber.  If you have diabetes, follow   all diet plans and take your medicine as told.  Ask your doctor if you need treatment to lower your blood pressure. If you have high blood pressure (hypertension), follow all diet plans and take your medicine as told by your doctor.  If you are 18-39 years old, have your blood pressure checked every 3-5 years. If you are age 40 or older, have your blood pressure checked every year.  Keep a healthy weight. Eat foods that are low in calories, salt, saturated fat, trans fat, and cholesterol.  Do not take drugs.  Avoid birth control pills, if this applies. Talk to your doctor about the risks of taking birth control pills.  Talk to  your doctor if you have sleep problems (sleep apnea).  Take all medicine as told by your doctor. ? You may be told to take aspirin or blood thinner medicine. Take this medicine as told by your doctor. ? Understand your medicine instructions.  Make sure any other conditions you have are being taken care of.  Get help right away if:  You suddenly lose feeling (you feel numb) or have weakness in your face, arm, or leg.  Your face or eyelid hangs down to one side.  You suddenly feel confused.  You have trouble talking (aphasia) or understanding what people are saying.  You suddenly have trouble seeing in one or both eyes.  You suddenly have trouble walking.  You are dizzy.  You lose your balance or your movements are clumsy (uncoordinated).  You suddenly have a very bad headache and you do not know the cause.  You have new chest pain.  Your heart feels like it is fluttering or skipping a beat (irregular heartbeat). Do not wait to see if the symptoms above go away. Get help right away. Call your local emergency services (911 in [place] Do not drive yourself to the hospital. This information is not intended to replace advice given to you by your health care provider. Make sure you discuss any questions you have with your health care provider. Document Released: 12/26/2011 Document Revised: 12/02/2015 Document Reviewed: 12/27/2012 Elsevier Interactive Patient Education  2018 Elsevier Inc.  

## 2017-05-24 NOTE — Addendum Note (Signed)
Addended by: Burton ApleyPETTY, Ledora Delker A on: 05/24/2017 03:32 PM   Modules accepted: Orders

## 2017-05-29 ENCOUNTER — Ambulatory Visit (INDEPENDENT_AMBULATORY_CARE_PROVIDER_SITE_OTHER): Payer: Self-pay

## 2017-05-29 ENCOUNTER — Ambulatory Visit (INDEPENDENT_AMBULATORY_CARE_PROVIDER_SITE_OTHER): Payer: Medicare Other | Admitting: Orthopaedic Surgery

## 2017-05-29 ENCOUNTER — Other Ambulatory Visit (INDEPENDENT_AMBULATORY_CARE_PROVIDER_SITE_OTHER): Payer: Self-pay

## 2017-05-29 ENCOUNTER — Encounter (INDEPENDENT_AMBULATORY_CARE_PROVIDER_SITE_OTHER): Payer: Self-pay | Admitting: Orthopaedic Surgery

## 2017-05-29 VITALS — BP 155/75 | HR 77 | Resp 12 | Ht 65.0 in | Wt 190.0 lb

## 2017-05-29 DIAGNOSIS — M1612 Unilateral primary osteoarthritis, left hip: Secondary | ICD-10-CM | POA: Diagnosis not present

## 2017-05-29 DIAGNOSIS — I739 Peripheral vascular disease, unspecified: Secondary | ICD-10-CM | POA: Diagnosis not present

## 2017-05-29 DIAGNOSIS — M25552 Pain in left hip: Secondary | ICD-10-CM | POA: Diagnosis not present

## 2017-05-29 DIAGNOSIS — M5136 Other intervertebral disc degeneration, lumbar region: Secondary | ICD-10-CM | POA: Diagnosis not present

## 2017-05-29 NOTE — Progress Notes (Signed)
Office Visit Note   Patient: [name]           Date of Birth: [birth year]           MRN: [id number] Visit Date: 05/29/2017              Requested by: [name], Ashish, MD [address] [place], [place] [id number] PCP: [name], Ashish, MD   Assessment & Plan: Visit Diagnoses:  1. Unilateral primary osteoarthritis, left hip   2. Pain in left hip   3. Degenerative disc disease, lumbar   4. Peripheral arterial disease (HCC)     Plan:  #1: At this time we'll try to have him scheduled for corticosteroid injection to the left hip in [place]. #2: There is a discussion of pain medication of which I told him that his medical doctor would be the best for that. #3: With his medical history of COPD and as well as multiple arterial bypasses that he probably will not be a candidate for any operative intervention.  Follow-Up Instructions: Return if symptoms worsen or fail to improve.   Face-to-face time spent with patient was greater than 30 minutes.  Greater than 50% of the time was spent in counseling and coordination of care.  Orders:  Orders Placed This Encounter  Procedures  . XR Lumbar Spine 2-3 Views  . XR HIP UNILAT W OR W/O PELVIS 2-3 VIEWS LEFT   No orders of the defined types were placed in this encounter.     Procedures: No procedures performed   Clinical Data: No additional findings.   Subjective: Chief Complaint  Patient presents with  . Left Hip - Pain    [name] is a 71 y o that presents with Left hip pain x 2 months. He relates he was working [employer] and it "just started hurtin." No injury, no calf pain  Pain.no radiating pain. Pt  Does have lateral hip and groin pain.    [name] is a very pleasant 71 year old white male who presents today with a 2 month history of worsening left hip pain. He does state that he had pain starting back in about a year ago in this area but was not as painful as it is now. He demonstrates pain along the lateral aspect of his left proximal  femur and down into his groin. His pain is the dominant during ambulation. Minimal at rest. 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 He denies any history of injury or trauma. He does not have any pain radiating down into the leg or thigh.        Review of Systems  Constitutional: Negative for fatigue.  HENT: Negative for hearing loss.   Respiratory: Negative for apnea, chest tightness and shortness of breath.   Cardiovascular: Negative for chest pain, palpitations and leg swelling.  Gastrointestinal: Negative for blood in stool, constipation and diarrhea.  Genitourinary: Negative for difficulty urinating.  Musculoskeletal: Negative for arthralgias, back pain, joint swelling, myalgias, neck pain and neck stiffness.  Neurological: Positive for dizziness and headaches. Negative for weakness and numbness.  Hematological: Does not bruise/bleed easily.  Psychiatric/Behavioral: Positive for sleep disturbance. The patient is not nervous/anxious.      Objective: Vital Signs: BP (!) 155/75   Pulse 77   Resp 12   Ht 5\' 5"  (1.651 m)   Wt 190 lb (86.2 kg)   BMI 31.62 kg/m   Physical Exam  Constitutional: He is oriented to person, place, and time.  He appears well-developed and well-nourished.  Markedly obese  HENT:  Head: Normocephalic.  Eyes: EOM are normal. Pupils are equal, round, and reactive to light.  Pulmonary/Chest: Effort normal.  Difficulty with breathing He also has coughing when lying supine  Neurological: He is alert and oriented to person, place, and time.  Skin: Skin is warm and dry.    Ortho Exam  Today he has limited range of motion of his left hip which causes pain at extremes. I only have about 20 of internal/external rotation none noted. I can flex him to about 100. Unable to feel pulses in the leg at this time. Negative straight leg raising.    Specialty Comments:  No specialty comments available.  Imaging: Xr Hip Unilat W Or W/o Pelvis  2-3 Views Left  Result Date: 05/29/2017 AP pelvis and left hip reveals marked the narrowing of the left femoral acetabular space with irregularity. There appears to be some depression of the lateral aspect of the femoral head. There some cystic changes in that area. 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  Xr Lumbar Spine 2-3 Views  Result Date: 05/29/2017 Two-view x-ray of the lumbar spine reveals marked calcification of the aorta and the both femoral arteries. Certainly at L2-3 and L3-4 reveals marked enlargement of the aorta with narrowing anterior to the L5 and the bifurcation. He does have some compression at L5. Some scalloping superior dome of L2. Does have some degenerative disc disease diffusely as well as what appears to be some foraminal stenosis.    PMFS History: Patient Active Problem List   Diagnosis Date Noted  . Atherosclerosis of native arteries of the extremities with intermittent claudication 03/30/2014  . Aftercare following surgery of the circulatory system, NEC 03/30/2014  . PAD (peripheral artery disease) (HCC) 03/30/2014  . Muscle cramps- Right  Flank / Sharyne Peach 03/30/2014  . Paresthesia of right foot 03/30/2014  . Chest pain 12/30/2013  . Unstable angina (HCC) 12/29/2013  . Chest pain at rest 12/29/2013  . Leukocytosis 12/29/2013  . Chronic respiratory failure (HCC) 12/29/2013  . Hyperglycemia 12/29/2013  . COPD (chronic obstructive pulmonary disease) (HCC)   . HTN (hypertension), malignant 10/07/2013  . Dyspnea 03/06/2013  . Tobacco abuse   . HLD (hyperlipidemia)   . Peripheral vascular disease, unspecified (HCC) 03/26/2012  . Coronary atherosclerosis of native coronary artery 07/28/2011  . Mixed hyperlipidemia 07/28/2011  . Essential hypertension, benign 07/28/2011  . Peripheral arterial disease (HCC) 07/28/2011  . Carotid artery disease (HCC) 07/28/2011   Past Medical  History:  Diagnosis Date  . Carotid artery disease (HCC)    50-69% RICA and less than 50% LICA, 07/2011  . COPD (chronic obstructive pulmonary disease) (HCC)   . Coronary atherosclerosis of native coronary artery    Based on abnormal Myoview - inferior scar, 05/2011; LVEF 45-50%, echo, 07/2011  . Essential hypertension, benign   . HLD (hyperlipidemia)   . NSTEMI (non-ST elevated myocardial infarction) (HCC)    12/12  . Peripheral arterial disease (HCC)   . Tobacco abuse     Family History  Problem Relation Age of Onset  . Heart attack Father   . Heart attack Mother     Past Surgical History:  Procedure Laterality Date  . Right axillary to femoral bypass  3/12   Dr. Hart Rochester  . RIGHT HYDROCELE REPAIR    . Right to left femoral to femoral bypass  3/12  Dr. Hart RochesterLawson  . SIGMOID RESECTION / RECTOPEXY  2005   Social History   Occupational History  . Not on file  Tobacco Use  . Smoking status: Former Smoker    Packs/day: 2.00    Years: 55.00    Pack years: 110.00    Types: Cigarettes    Start date: 12/03/1959    Last attempt to quit: 12/09/2010    Years since quitting: 6.4  . Smokeless tobacco: Never Used  . Tobacco comment: Patient has not somked since midnight on wednesday.   Substance and Sexual Activity  . Alcohol use: No    Alcohol/week: 0.0 oz  . Drug use: No  . Sexual activity: Not on file

## 2017-12-06 ENCOUNTER — Institutional Professional Consult (permissible substitution): Payer: Medicare Other | Admitting: Emergency Medicine

## 2017-12-07 ENCOUNTER — Encounter: Payer: Self-pay | Admitting: *Deleted

## 2017-12-07 ENCOUNTER — Ambulatory Visit (INDEPENDENT_AMBULATORY_CARE_PROVIDER_SITE_OTHER): Payer: Medicare Other | Admitting: Cardiovascular Disease

## 2017-12-07 ENCOUNTER — Encounter

## 2017-12-07 VITALS — BP 130/78 | HR 85 | Ht 63.0 in | Wt 188.2 lb

## 2017-12-07 DIAGNOSIS — R079 Chest pain, unspecified: Secondary | ICD-10-CM

## 2017-12-07 DIAGNOSIS — I739 Peripheral vascular disease, unspecified: Secondary | ICD-10-CM | POA: Diagnosis not present

## 2017-12-07 DIAGNOSIS — E785 Hyperlipidemia, unspecified: Secondary | ICD-10-CM

## 2017-12-07 DIAGNOSIS — I25118 Atherosclerotic heart disease of native coronary artery with other forms of angina pectoris: Secondary | ICD-10-CM

## 2017-12-07 DIAGNOSIS — I10 Essential (primary) hypertension: Secondary | ICD-10-CM | POA: Diagnosis not present

## 2017-12-07 DIAGNOSIS — Z01818 Encounter for other preprocedural examination: Secondary | ICD-10-CM | POA: Diagnosis not present

## 2017-12-07 MED ORDER — ISOSORBIDE MONONITRATE ER 60 MG PO TB24: 60.0000 mg | ORAL_TABLET | Freq: Every day | ORAL | 3 refills | Status: DC

## 2017-12-07 NOTE — Patient Instructions (Signed)
Medication Instructions:  STOP ISORDIL   START IMDUR 60 MG DAILY   Labwork: NONE  Testing/Procedures: Your physician has requested that you have a dobutamine myoview. For furth information please visit [email or web address]. Please follow instruction sheet, as given.    Follow-Up: Your physician recommends that you schedule a follow-up appointment in: 3 MONTHS ([place])    Any Other Special Instructions Will Be Listed Below (If Applicable).     If you need a refill on your cardiac medications before your next appointment, please call your pharmacy.

## 2017-12-07 NOTE — Progress Notes (Signed)
CARDIOLOGY CONSULT NOTE  Patient ID: CHANTZ MONTEFUSCO MRN: [id number] DOB/AGE: 73/05/1946 73 y.o.  Admit date: (Not on file) Primary Physician: Kirstie Peri, MD Referring Physician: Dr. Sherryll Burger  Reason for Consultation: Coronary artery disease  HPI: [name] is a 72 y.o. male who is being seen today for the evaluation of coronary artery disease at the request of Kirstie Peri, MD.  He has a history of medically managed non-STEMI.  He underwent a nuclear stress test in November 2012 which demonstrated inferior scar, LVEF 45 to 50%.  He also has a history of hypertension, COPD, and peripheral arterial disease.  He underwent right axillary to femoral bypass and right to left femoral to femoral bypass in March 2012.  Most recent stress test was a dobutamine Cardiolite performed on 12/30/2013 which demonstrated a moderate degree of inferior wall myocardial scar extending from the apex to the base with no significant areas of ischemia.  Most recent echocardiogram was performed on 12/29/2013 which demonstrated normal left ventricular systolic function, LVEF 50 to 10%, mild LVH, grade 1 diastolic dysfunction with high ventricular filling pressures.  I personally reviewed the ECG performed today which demonstrated sinus rhythm with right bundle branch block.  He is here with his daughter.  He is needing undergo left hip replacement surgery.  He is here for preoperative risk stratification.  His daughter says he gets chest pain about once a week or once every 2 weeks.  He took nitroglycerin last week for chest pain.  It does not occur that often.  He has chronic exertional dyspnea.  Carotid Dopplers on 05/19/2016 showed mild 1 to 39% bilateral stenosis.  His daughter is worried about the early onset of dementia.   No Known Allergies  Current Outpatient Medications  Medication Sig Dispense Refill  . albuterol (PROAIR HFA) 108 (90 BASE) MCG/ACT inhaler Inhale 2 puffs into the lungs  every 6 (six) hours as needed for shortness of breath.     Marland Kitchen albuterol (PROVENTIL) (2.5 MG/3ML) 0.083% nebulizer solution Take 2.5 mg by nebulization every 6 (six) hours as needed for shortness of breath.     Marland Kitchen aspirin EC 81 MG tablet Take 1 tablet (81 mg total) by mouth daily.    . diclofenac (VOLTAREN) 75 MG EC tablet Take 1 tablet by mouth 2 (two) times daily.    . hydrochlorothiazide (HYDRODIURIL) 25 MG tablet Take 1 tablet (25 mg total) by mouth daily. 7 tablet 0  . HYDROcodone-acetaminophen (NORCO/VICODIN) 5-325 MG tablet Take 1 tablet by mouth 2 (two) times daily.  0  . isosorbide dinitrate (ISORDIL) 10 MG tablet Take 1 tablet (10 mg total) by mouth 3 (three) times daily. 21 tablet 0  . lisinopril-hydrochlorothiazide (PRINZIDE,ZESTORETIC) 20-12.5 MG tablet Take 1 tablet by mouth daily.    . nitroGLYCERIN (NITROSTAT) 0.4 MG SL tablet Place 1 tablet (0.4 mg total) under the tongue every 5 (five) minutes x 3 doses as needed for chest pain. 25 tablet 3  . OXYGEN Inhale 4 L into the lungs.    . simvastatin (ZOCOR) 20 MG tablet TAKE 1 TABLET BY MOUTH EVERY DAY 10 tablet 0  . umeclidinium-vilanterol (ANORO ELLIPTA) 62.5-25 MCG/INH AEPB Inhale 1 puff daily into the lungs.     No current facility-administered medications for this visit.     Past Medical History:  Diagnosis Date  . Carotid artery disease (HCC)    50-69% RICA and less than 50% LICA, 07/2011  . COPD (chronic obstructive pulmonary  disease) (HCC)   . Coronary atherosclerosis of native coronary artery    Based on abnormal Myoview - inferior scar, 05/2011; LVEF 45-50%, echo, 07/2011  . Essential hypertension, benign   . HLD (hyperlipidemia)   . NSTEMI (non-ST elevated myocardial infarction) (HCC)    12/12  . Peripheral arterial disease (HCC)   . Tobacco abuse     Past Surgical History:  Procedure Laterality Date  . Right axillary to femoral bypass  3/12   Dr. Hart Rochester  . RIGHT HYDROCELE REPAIR    . Right to left femoral to  femoral bypass  3/12   Dr. Hart Rochester  . SIGMOID RESECTION / RECTOPEXY  2005    Social History   Socioeconomic History  . Marital status: Divorced    Spouse name: Not on file  . Number of children: Not on file  . Years of education: Not on file  . Highest education level: Not on file  Occupational History  . Not on file  Social Needs  . Financial resource strain: Not on file  . Food insecurity:    Worry: Not on file    Inability: Not on file  . Transportation needs:    Medical: Not on file    Non-medical: Not on file  Tobacco Use  . Smoking status: Former Smoker    Packs/day: 2.00    Years: 55.00    Pack years: 110.00    Types: Cigarettes    Start date: 12/03/1959    Last attempt to quit: 12/09/2010    Years since quitting: 7.0  . Smokeless tobacco: Never Used  . Tobacco comment: Patient has not somked since midnight on wednesday.   Substance and Sexual Activity  . Alcohol use: No    Alcohol/week: 0.0 oz  . Drug use: No  . Sexual activity: Not on file  Lifestyle  . Physical activity:    Days per week: Not on file    Minutes per session: Not on file  . Stress: Not on file  Relationships  . Social connections:    Talks on phone: Not on file    Gets together: Not on file    Attends religious service: Not on file    Active member of club or organization: Not on file    Attends meetings of clubs or organizations: Not on file    Relationship status: Not on file  . Intimate partner violence:    Fear of current or ex partner: Not on file    Emotionally abused: Not on file    Physically abused: Not on file    Forced sexual activity: Not on file  Other Topics Concern  . Not on file  Social History Narrative  . Not on file     No family history of premature CAD in 1st degree relatives.  Current Meds  Medication Sig  . albuterol (PROAIR HFA) 108 (90 BASE) MCG/ACT inhaler Inhale 2 puffs into the lungs every 6 (six) hours as needed for shortness of breath.   Marland Kitchen  albuterol (PROVENTIL) (2.5 MG/3ML) 0.083% nebulizer solution Take 2.5 mg by nebulization every 6 (six) hours as needed for shortness of breath.   Marland Kitchen aspirin EC 81 MG tablet Take 1 tablet (81 mg total) by mouth daily.  . diclofenac (VOLTAREN) 75 MG EC tablet Take 1 tablet by mouth 2 (two) times daily.  . hydrochlorothiazide (HYDRODIURIL) 25 MG tablet Take 1 tablet (25 mg total) by mouth daily.  Marland Kitchen HYDROcodone-acetaminophen (NORCO/VICODIN) 5-325 MG tablet Take 1 tablet  by mouth 2 (two) times daily.  . isosorbide dinitrate (ISORDIL) 10 MG tablet Take 1 tablet (10 mg total) by mouth 3 (three) times daily.  Marland Kitchen. lisinopril-hydrochlorothiazide (PRINZIDE,ZESTORETIC) 20-12.5 MG tablet Take 1 tablet by mouth daily.  . nitroGLYCERIN (NITROSTAT) 0.4 MG SL tablet Place 1 tablet (0.4 mg total) under the tongue every 5 (five) minutes x 3 doses as needed for chest pain.  . OXYGEN Inhale 4 L into the lungs.  . simvastatin (ZOCOR) 20 MG tablet TAKE 1 TABLET BY MOUTH EVERY DAY  . umeclidinium-vilanterol (ANORO ELLIPTA) 62.5-25 MCG/INH AEPB Inhale 1 puff daily into the lungs.  . [DISCONTINUED] lisinopril (PRINIVIL,ZESTRIL) 20 MG tablet TAKE 1 TABLET BY MOUTH TWICE DAILY      Review of systems complete and found to be negative unless listed above in HPI    Physical exam Blood pressure 130/78, pulse 85, height 5\' 3"  (1.6 m), weight 188 lb 3.2 oz (85.4 kg), SpO2 93 %. General: NAD Neck: No JVD, no thyromegaly or thyroid nodule.  Lungs: Diffuse bilateral rhonchi, no crackles or wheezes. CV: Nondisplaced PMI. Regular rate and rhythm, normal S1/S2, no S3/S4, no murmur.  No peripheral edema.  Bilateral carotid bruits. Abdomen: Soft, nontender, no distention.  Skin: Intact without lesions or rashes.  Neurologic: Alert and oriented x 3.  Psych: Normal affect. Extremities: No clubbing or cyanosis.  HEENT: Normal.   ECG: Most recent ECG reviewed.   Labs: Lab Results  Component Value Date/Time   K 3.4 (L)  12/30/2013 04:45 AM   BUN 17 12/30/2013 04:45 AM   CREATININE 0.88 12/30/2013 04:45 AM   CREATININE 0.90 05/21/2013 07:04 AM   ALT 21 12/29/2013 03:51 PM   TSH 2.170 12/29/2013 09:33 AM   HGB 12.4 (L) 12/30/2013 04:45 AM     Lipids: Lab Results  Component Value Date/Time   LDLCALC 57 12/29/2013 09:15 AM   CHOL 120 12/29/2013 09:15 AM   TRIG 79 12/29/2013 09:15 AM   HDL 47 12/29/2013 09:15 AM        ASSESSMENT AND PLAN:  1.  Coronary artery disease with stable angina pectoris: Symptomatically stable overall.  Currently on aspirin, isosorbide dinitrate, and simvastatin.  I will switch isosorbide dinitrate to 60 mg of Imdur daily.  I will obtain a dobutamine Myoview nuclear stress test to evaluate for any areas of significant ischemia.  2.  Peripheral arterial disease: On aspirin and statin therapy and followed by vascular surgery.  3.  Hypertension: Blood pressure is normal.  No change to therapy.  4.  Hyperlipidemia: Currently on simvastatin 20 mg.  I will obtain a copy of lipids from PCP.  5.  Preoperative risk stratification: He is certainly at an increased risk for major adverse cardiac events in the perioperative period due to both coronary artery disease and peripheral arterial disease as well as COPD.   I will obtain a dobutamine Myoview nuclear stress test to evaluate for any areas of significant ischemia.    Disposition: Follow [place] in the months in [place]   Signed: Prentice DockerSuresh Koneswaran, M.D., F.A.C.C.  12/07/2017, 3:21 PM

## 2017-12-11 ENCOUNTER — Encounter: Payer: Self-pay | Admitting: Pulmonary Disease

## 2017-12-11 ENCOUNTER — Ambulatory Visit (INDEPENDENT_AMBULATORY_CARE_PROVIDER_SITE_OTHER): Payer: Medicare Other | Admitting: Pulmonary Disease

## 2017-12-11 ENCOUNTER — Ambulatory Visit (INDEPENDENT_AMBULATORY_CARE_PROVIDER_SITE_OTHER)
Admission: RE | Admit: 2017-12-11 | Discharge: 2017-12-11 | Disposition: A | Discharge status: Home / Self Care | Payer: Medicare Other | Source: Ambulatory Visit | Attending: Pulmonary Disease | Admitting: Pulmonary Disease

## 2017-12-11 VITALS — BP 124/80 | HR 87 | Ht 63.0 in | Wt 189.0 lb

## 2017-12-11 DIAGNOSIS — I10 Essential (primary) hypertension: Secondary | ICD-10-CM

## 2017-12-11 DIAGNOSIS — J449 Chronic obstructive pulmonary disease, unspecified: Secondary | ICD-10-CM | POA: Diagnosis not present

## 2017-12-11 NOTE — Progress Notes (Signed)
Subjective:    Patient ID: [name], male    DOB: Aug 30, 1945, 72 y.o.   MRN: [id number]  HPI  72 year old ex-smoker with COPD presents for surgical clearance prior to left hip replacement. He smoked more than 100 pack years before he quit in 2012 He was diagnosed with COPD by Dr. Orson AloeHenderson years ago.  He was on Advair and Symbicort for many years and this was changed to Anoro about 2 years ago and he feels that this medication is really helped him.  He has not required his rescue inhaler much. With the past few months he has been limited more by his left hip pain rather than dyspnea.  He is able to walk around the house and in the store with some difficulty.  He cannot climb stairs due to left hip pain.  He also reports a chronic dry cough sometimes throat clearing which he has attributed as a smoker's cough. He underwent cardiac evaluation for surgical clearance and a stress test is planned 12/2013 dobutamine stress test did not show any signs of ischemia, suggested inferior wall scarring 12/2013 echo showed normal LV function  He underwent right hemicolectomy in 2008 and in 09/2010 underwent left axillary femoral bypass and right femoral-femoral bypass.  He quit drinking in 2002 medication review shows lisinopril For hypertension  And smoking in 2012. 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 He is accompanied by his granddaughter summer today who corroborates some of the history  Spirometry shows moderate airway obstruction with ratio 49, FEV1 of 66% and FVC of 96%    Past Medical History:  Diagnosis Date  . Carotid artery disease (HCC)    50-69% RICA and less than 50% LICA, 07/2011  . COPD (chronic obstructive pulmonary disease) (HCC)   . Coronary atherosclerosis of native coronary artery    Based on abnormal Myoview - inferior scar, 05/2011; LVEF 45-50%, echo, 07/2011  . Essential hypertension, benign   . HLD (hyperlipidemia)   . NSTEMI (non-ST  elevated myocardial infarction) (HCC)    12/12  . Peripheral arterial disease (HCC)   . Tobacco abuse     Past Surgical History:  Procedure Laterality Date  . Right axillary to femoral bypass  3/12   Dr. Hart RochesterLawson  . RIGHT HYDROCELE REPAIR    . Right to left femoral to femoral bypass  3/12   Dr. Hart RochesterLawson  . SIGMOID RESECTION / RECTOPEXY  2005    No Known Allergies  Social History   Socioeconomic History  . Marital status: Divorced    Spouse name: Not on file  . Number of children: Not on file  . Years of education: Not on file  . Highest education level: Not on file  Occupational History  . Not on file  Social Needs  . Financial resource strain: Not on file  . Food insecurity:    Worry: Not on file    Inability: Not on file  . Transportation needs:    Medical: Not on file    Non-medical: Not on file  Tobacco Use  . Smoking status: Former Smoker    Packs/day: 2.00    Years: 55.00    Pack years: 110.00    Types: Cigarettes    Start date: 12/03/1959    Last attempt to quit: 12/09/2010    Years since quitting: 7.0  . Smokeless tobacco: Never Used  . Tobacco comment: Patient has not somked since midnight on wednesday.   Substance  and Sexual Activity  . Alcohol use: No    Alcohol/week: 0.0 oz  . Drug use: No  . Sexual activity: Not on file  Lifestyle  . Physical activity:    Days per week: Not on file    Minutes per session: Not on file  . Stress: Not on file  Relationships  . Social connections:    Talks on phone: Not on file    Gets together: Not on file    Attends religious service: Not on file    Active member of club or organization: Not on file    Attends meetings of clubs or organizations: Not on file    Relationship status: Not on file  . Intimate partner violence:    Fear of current or ex partner: Not on file    Emotionally abused: Not on file    Physically abused: Not on file    Forced sexual activity: Not on file  Other Topics Concern  . Not on file   Social History Narrative  . Not on file     Family History  Problem Relation Age of Onset  . Heart attack Father   . Heart attack Mother      Review of Systems  Constitutional: Negative for fever and unexpected weight change.  HENT: Positive for congestion. Negative for dental problem, ear pain, nosebleeds, postnasal drip, rhinorrhea, sinus pressure, sneezing, sore throat and trouble swallowing.   Eyes: Negative for redness and itching.  Respiratory: Positive for cough and shortness of breath. Negative for chest tightness and wheezing.   Cardiovascular: Negative for palpitations and leg swelling.  Gastrointestinal: Negative for nausea and vomiting.  Genitourinary: Negative for dysuria.  Musculoskeletal: Negative for joint swelling.  Skin: Negative for rash.  Allergic/Immunologic: Negative.  Negative for environmental allergies, food allergies and immunocompromised state.  Neurological: Negative for headaches.  Hematological: Does not bruise/bleed easily.  Psychiatric/Behavioral: Negative for dysphoric mood. The patient is not nervous/anxious.        Objective:   Physical Exam  Gen. Pleasant, well-nourished, in no distress, normal affect ENT - no thrush, no post nasal drip Neck: No JVD, no thyromegaly, no carotid bruits Lungs: no use of accessory muscles, no dullness to percussion, decreased without rales or rhonchi  Cardiovascular: Rhythm regular, heart sounds  normal, no murmurs or gallops, no peripheral edema Abdomen: soft and non-tender, no hepatosplenomegaly, BS normal. Musculoskeletal: No deformities, no cyanosis or clubbing Neuro:  alert, non focal       Assessment & Plan:

## 2017-12-11 NOTE — Assessment & Plan Note (Signed)
Cough may be related to lisinopril, discussed with Dr. Sherryll BurgerShah about changing to another medication such as losartan in the same family.

## 2017-12-11 NOTE — Patient Instructions (Addendum)
Cough may be related to lisinopril, discussed with Dr. Sherryll BurgerShah about changing to another medication such as losartan in the same family.  Lung function is at 66%. Chest x-ray today  You are cleared for surgery with due risk. Continue on Anoro once daily, can use albuterol 2 puffs as needed for shortness of breath or wheezing

## 2017-12-11 NOTE — Assessment & Plan Note (Signed)
Surprisingly, lung function is maintained at 66% Chest x-ray today  You are cleared for surgery with due risk. Continue on Anoro once daily, can use albuterol 2 puffs as needed for shortness of breath or wheezing  Can use duo nebs if hospitalized post surgery. We will also need DVT prophylaxis as would be routine with such surgery  He can follow-up with us or with his PCP, discussed signs and symptoms of COPD exacerbation

## 2017-12-19 ENCOUNTER — Other Ambulatory Visit: Payer: Self-pay | Admitting: Pulmonary Disease

## 2017-12-19 DIAGNOSIS — R9389 Abnormal findings on diagnostic imaging of other specified body structures: Secondary | ICD-10-CM

## 2017-12-21 ENCOUNTER — Encounter (HOSPITAL_COMMUNITY)
Admission: RE | Admit: 2017-12-21 | Discharge: 2017-12-21 | Disposition: A | Discharge status: Home / Self Care | Payer: Medicare Other | Source: Ambulatory Visit | Attending: Cardiovascular Disease | Admitting: Cardiovascular Disease

## 2017-12-21 ENCOUNTER — Encounter (HOSPITAL_COMMUNITY): Payer: Self-pay

## 2017-12-21 ENCOUNTER — Encounter (HOSPITAL_BASED_OUTPATIENT_CLINIC_OR_DEPARTMENT_OTHER)
Admission: RE | Admit: 2017-12-21 | Discharge: 2017-12-21 | Disposition: A | Discharge status: Home / Self Care | Payer: Medicare Other | Source: Ambulatory Visit | Attending: Cardiovascular Disease | Admitting: Cardiovascular Disease

## 2017-12-21 DIAGNOSIS — R079 Chest pain, unspecified: Secondary | ICD-10-CM

## 2017-12-21 HISTORY — DX: Unspecified asthma, uncomplicated: J45.909

## 2017-12-21 LAB — NM MYOCAR MULTI W/SPECT W/WALL MOTION / EF
CHL CUP NUCLEAR SSS: 6
LHR: 0.3
LV dias vol: 76 mL (ref 62–150)
LV sys vol: 32 mL
NUC STRESS TID: 1.11
Peak HR: 97 {beats}/min
Rest HR: 79 {beats}/min
SDS: 2
SRS: 4

## 2017-12-21 MED ORDER — DOBUTAMINE IN D5W 4-5 MG/ML-% IV SOLN
INTRAVENOUS | Status: AC
  Filled 2017-12-21: qty 250

## 2017-12-21 MED ORDER — REGADENOSON 0.4 MG/5ML IV SOLN
INTRAVENOUS | Status: AC
  Administered 2017-12-21: 0.4 mg via INTRAVENOUS
  Filled 2017-12-21: qty 5

## 2017-12-21 MED ORDER — SODIUM CHLORIDE 0.9% FLUSH
INTRAVENOUS | Status: AC
  Administered 2017-12-21: 10 mL via INTRAVENOUS
  Filled 2017-12-21: qty 10

## 2017-12-21 MED ORDER — TECHNETIUM TC 99M TETROFOSMIN IV KIT
10.0000 | PACK | Freq: Once | INTRAVENOUS | Status: AC | PRN
  Administered 2017-12-21: 11 via INTRAVENOUS

## 2017-12-21 MED ORDER — TECHNETIUM TC 99M TETROFOSMIN IV KIT
30.0000 | PACK | Freq: Once | INTRAVENOUS | Status: AC | PRN
  Administered 2017-12-21: 31 via INTRAVENOUS

## 2017-12-28 ENCOUNTER — Ambulatory Visit (HOSPITAL_COMMUNITY): Admission: RE | Admit: 2017-12-28 | Payer: Medicare Other | Source: Ambulatory Visit

## 2018-01-07 ENCOUNTER — Ambulatory Visit (HOSPITAL_COMMUNITY)
Admission: RE | Admit: 2018-01-07 | Discharge: 2018-01-07 | Disposition: A | Discharge status: Home / Self Care | Payer: Medicare Other | Source: Ambulatory Visit | Attending: Pulmonary Disease | Admitting: Pulmonary Disease

## 2018-01-07 DIAGNOSIS — I2584 Coronary atherosclerosis due to calcified coronary lesion: Secondary | ICD-10-CM | POA: Diagnosis not present

## 2018-01-07 DIAGNOSIS — R918 Other nonspecific abnormal finding of lung field: Secondary | ICD-10-CM | POA: Diagnosis not present

## 2018-01-07 DIAGNOSIS — J432 Centrilobular emphysema: Secondary | ICD-10-CM | POA: Insufficient documentation

## 2018-01-07 DIAGNOSIS — R9389 Abnormal findings on diagnostic imaging of other specified body structures: Secondary | ICD-10-CM | POA: Diagnosis not present

## 2018-01-07 DIAGNOSIS — Z87891 Personal history of nicotine dependence: Secondary | ICD-10-CM | POA: Insufficient documentation

## 2018-01-07 DIAGNOSIS — I2510 Atherosclerotic heart disease of native coronary artery without angina pectoris: Secondary | ICD-10-CM | POA: Diagnosis not present

## 2018-01-07 DIAGNOSIS — I700 Atherosclerosis of aorta: Secondary | ICD-10-CM | POA: Insufficient documentation

## 2018-01-08 NOTE — Progress Notes (Signed)
LMTCB

## 2018-01-09 ENCOUNTER — Ambulatory Visit (INDEPENDENT_AMBULATORY_CARE_PROVIDER_SITE_OTHER): Payer: Medicare Other | Admitting: Orthopaedic Surgery

## 2018-01-09 ENCOUNTER — Encounter (INDEPENDENT_AMBULATORY_CARE_PROVIDER_SITE_OTHER): Payer: Self-pay | Admitting: Orthopaedic Surgery

## 2018-01-09 DIAGNOSIS — M1612 Unilateral primary osteoarthritis, left hip: Secondary | ICD-10-CM

## 2018-01-09 NOTE — Progress Notes (Signed)
Office Visit Note   Patient: [name]           Date of Birth: 1945-09-24           MRN: [id number] Visit Date: 01/09/2018              Requested by: [name], Ashish, MD [address] [place], [place] [id number] PCP: [name], Ashish, MD   Assessment & Plan: Visit Diagnoses:  1. Unilateral primary osteoarthritis, left hip     Plan:  [name] is accompanied by his daughter for evaluation of the osteoarthritis of his left hip.  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  He is now reached the point where he is "miserable.  He is recently been evaluated by his pulmonologist and cardiologist and he notes he is been "cleared" for surgery.  On discussion regarding total hip replacement including a hospitalization at occasions occluding not limited to stroke, cardiovascular event, infection, dislocation.  Like to proceed despite the potential problems.  We will Re [facility] formal clearance forms from his cardiologist, pulmonologist and primary care physician.  Follow-Up Instructions: Return will schedule surgery left hiup.   Orders:  No orders of the defined types were placed in this encounter.  No orders of the defined types were placed in this encounter.     Procedures: No procedures performed   Clinical Data: No additional findings.   Subjective: No chief complaint on file. [name] is 72 years old recently seen in November 2018 for evaluation of left hip pain.  X-rays were consistent with what may have been avascular necrosis in addition to osteoarthris. intra-articular cortisone injection provided some relief.  [name] has  reached the point where he is severely compromised in his ability to ambulate and perform activities of daily living.  Has significant issues with his lungs and heart including COPD and cardiovascular disease.  HPI  Review of  Systems   Objective: Vital Signs: There were no vitals taken for this visit.  Physical Exam  Constitutional: He is oriented to person, place, and time. He appears well-developed and well-nourished.  HENT:  Mouth/Throat: Oropharynx is clear and moist.  Eyes: Pupils are equal, round, and reactive to light. EOM are normal.  Pulmonary/Chest: Effort normal.  Neurological: He is alert and oriented to person, place, and time.  Skin: Skin is warm and dry.  Psychiatric: He has a normal mood and affect. His behavior is normal.    Ortho Exam alert and oriented x3.  No shortness of breath today.  Somewhat hard of hearing.  Walks with a distinct limp referable to his hip.  Limited range of motion left hip with internal/external rotation.  Is approximately 3/8 of an inch shorter some venous stasis changes distally.  Foot is warm but no knee pain.  No back pain.  Straight leg raise negative.  Left groin with internal/external rotation of his hip.  Some limited range of motion of his right hip but no significant pain  Specialty Comments:  No specialty comments available.  Imaging: No results found.   PMFS History: Patient Active Problem List   Diagnosis Date Noted  . Unilateral primary osteoarthritis, left hip 01/09/2018  . Atherosclerosis of native arteries of the extremities with intermittent claudication 03/30/2014  . Aftercare following surgery of the circulatory system, NEC 03/30/2014  . PAD (peripheral artery disease) (HCC) 03/30/2014  . Muscle cramps- Right  Flank / Sharyne Peachrubk  03/30/2014  . Paresthesia of right foot 03/30/2014  . Chest pain 12/30/2013  . Unstable angina (HCC) 12/29/2013  . Chest pain at rest 12/29/2013  . Chronic respiratory failure (HCC) 12/29/2013  . COPD (chronic obstructive pulmonary disease) (HCC)   . HTN (hypertension), malignant 10/07/2013  . Dyspnea 03/06/2013  . Tobacco abuse   . HLD (hyperlipidemia)   . Peripheral vascular disease, unspecified (HCC) 03/26/2012   . Coronary atherosclerosis of native coronary artery 07/28/2011  . Mixed hyperlipidemia 07/28/2011  . Essential hypertension, benign 07/28/2011  . Peripheral arterial disease (HCC) 07/28/2011  . Carotid artery disease (HCC) 07/28/2011   Past Medical History:  Diagnosis Date  . Asthma   . Carotid artery disease (HCC)    50-69% RICA and less than 50% LICA, 07/2011  . COPD (chronic obstructive pulmonary disease) (HCC)   . Coronary atherosclerosis of native coronary artery    Based on abnormal Myoview - inferior scar, 05/2011; LVEF 45-50%, echo, 07/2011  . Essential hypertension, benign   . HLD (hyperlipidemia)   . NSTEMI (non-ST elevated myocardial infarction) (HCC)    12/12  . Peripheral arterial disease (HCC)   . Tobacco abuse     Family History  Problem Relation Age of Onset  . Heart attack Father   . Heart attack Mother     Past Surgical History:  Procedure Laterality Date  . Right axillary to femoral bypass  3/12   Dr. Hart Rochester  . RIGHT HYDROCELE REPAIR    . Right to left femoral to femoral bypass  3/12   Dr. Hart Rochester  . SIGMOID RESECTION / RECTOPEXY  2005   Social History   Occupational History  . Not on file  Tobacco Use  . Smoking status: Former Smoker    Packs/day: 2.00    Years: 55.00    Pack years: 110.00    Types: Cigarettes    Start date: 12/03/1959    Last attempt to quit: 12/09/2010    Years since quitting: 7.0  . Smokeless tobacco: Never Used  . Tobacco comment: Patient has not somked since midnight on wednesday.   Substance and Sexual Activity  . Alcohol use: No    Alcohol/week: 0.0 oz  . Drug use: No  . Sexual activity: Not on file     Valeria Batman, MD   Note - This record has been created using [employer].  Chart creation errors have been sought, but may not always  have been located. Such creation errors do not reflect on  the standard of medical care.

## 2018-01-24 ENCOUNTER — Other Ambulatory Visit (INDEPENDENT_AMBULATORY_CARE_PROVIDER_SITE_OTHER): Payer: Self-pay

## 2018-01-31 NOTE — Pre-Procedure Instructions (Signed)
[name]  01/31/2018      [facility] - [place], [place] - [address] [place] [id number] Phone: [phone] Fax: [phone]    Your procedure is scheduled on Aug. 6  [facility] Admitting at 5:30 A.M.  Call this number if you have problems the morning of surgery:  [phone]   Remember:   Do not eat or drink after midnight.                     Take these medicines the morning of surgery with A SIP OF WATER :               Albuterol inhaler-bring to hospital             Albuterol nebulizer treatment             Hydrocodone if needed             Isosorbide (imdur)             Nitroglycerine if needed             anoro ellipta-bring to hospital              7 days prior to surgery STOP taking  Aleve, Naproxen, Ibuprofen, Motrin, Advil, Goody's, BC's, all herbal medications, fish oil, and all vitamins               Follow your surgeon's instructions on when to stop Asprin.  If no instructions were given by your surgeon then you will need to call the office to get those instructions.      Do not wear jewelry.  Do not wear lotions, powders, or perfumes, or deodorant.  Do not shave 48 hours prior to surgery.  Men may shave face and neck.  Do not bring valuables to the hospital.  [facility] is not responsible for any belongings or valuables.  Contacts, dentures or bridgework may not be worn into surgery.  Leave your suitcase in the car.  After surgery it may be brought to your room.  For patients admitted to the hospital, discharge time will be determined by your treatment team.  Patients discharged the day of surgery will not be allowed to drive home.    Special instructions:   [facility]- Preparing For Surgery  Before surgery, you can play an important role. Because skin is not sterile, your skin needs to be as free of germs as possible. You can reduce the number of germs on your skin by washing with  CHG (chlorahexidine gluconate) Soap before surgery.  CHG is an antiseptic cleaner which kills germs and bonds with the skin to continue killing germs even after washing.    Oral Hygiene is also important to reduce your risk of infection.  Remember - BRUSH YOUR TEETH THE MORNING OF SURGERY WITH YOUR REGULAR TOOTHPASTE  Please do not use if you have an allergy to CHG or antibacterial soaps. If your skin becomes reddened/irritated stop using the CHG.  Do not shave (including legs and underarms) for at least 48 hours prior to first CHG shower. It is OK to shave your face.  Please follow these instructions carefully.   1. Shower the NIGHT BEFORE SURGERY and the MORNING OF SURGERY with CHG.   2. If you chose to wash your hair, wash your hair first as usual with your normal shampoo.  3.  After you shampoo, rinse your hair and body thoroughly to remove the shampoo.  4. Use CHG as you would any other liquid soap. You can apply CHG directly to the skin and wash gently with a scrungie or a clean washcloth.   5. Apply the CHG Soap to your body ONLY FROM THE NECK DOWN.  Do not use on open wounds or open sores. Avoid contact with your eyes, ears, mouth and genitals (private parts). Wash Face and genitals (private parts)  with your normal soap.  6. Wash thoroughly, paying special attention to the area where your surgery will be performed.  7. Thoroughly rinse your body with warm water from the neck down.  8. DO NOT shower/wash with your normal soap after using and rinsing off the CHG Soap.  9. Pat yourself dry with a CLEAN TOWEL.  10. Wear CLEAN PAJAMAS to bed the night before surgery, wear comfortable clothes the morning of surgery  11. Place CLEAN SHEETS on your bed the night of your first shower and DO NOT SLEEP WITH PETS.    Day of Surgery:  Do not apply any deodorants/lotions.  Please wear clean clothes to the hospital/surgery center.   Remember to brush your teeth WITH YOUR REGULAR  TOOTHPASTE.    Please read over the following fact sheets that you were given. Coughing and Deep Breathing, MRSA Information and Surgical Site Infection Prevention

## 2018-02-01 ENCOUNTER — Encounter (HOSPITAL_COMMUNITY)
Admission: RE | Admit: 2018-02-01 | Discharge: 2018-02-01 | Disposition: A | Discharge status: Home / Self Care | Payer: Medicare Other | Source: Ambulatory Visit | Attending: Orthopaedic Surgery | Admitting: Orthopaedic Surgery

## 2018-02-01 ENCOUNTER — Other Ambulatory Visit: Payer: Self-pay

## 2018-02-01 ENCOUNTER — Encounter (HOSPITAL_COMMUNITY): Payer: Self-pay

## 2018-02-01 ENCOUNTER — Telehealth: Payer: Self-pay | Admitting: *Deleted

## 2018-02-01 DIAGNOSIS — Z79899 Other long term (current) drug therapy: Secondary | ICD-10-CM | POA: Diagnosis not present

## 2018-02-01 DIAGNOSIS — Z01812 Encounter for preprocedural laboratory examination: Secondary | ICD-10-CM | POA: Diagnosis not present

## 2018-02-01 DIAGNOSIS — E785 Hyperlipidemia, unspecified: Secondary | ICD-10-CM | POA: Insufficient documentation

## 2018-02-01 DIAGNOSIS — I10 Essential (primary) hypertension: Secondary | ICD-10-CM | POA: Insufficient documentation

## 2018-02-01 DIAGNOSIS — Z9981 Dependence on supplemental oxygen: Secondary | ICD-10-CM | POA: Diagnosis not present

## 2018-02-01 DIAGNOSIS — I2510 Atherosclerotic heart disease of native coronary artery without angina pectoris: Secondary | ICD-10-CM | POA: Diagnosis not present

## 2018-02-01 DIAGNOSIS — J449 Chronic obstructive pulmonary disease, unspecified: Secondary | ICD-10-CM | POA: Insufficient documentation

## 2018-02-01 DIAGNOSIS — Z7982 Long term (current) use of aspirin: Secondary | ICD-10-CM | POA: Diagnosis not present

## 2018-02-01 HISTORY — DX: Dependence on supplemental oxygen: Z99.81

## 2018-02-01 LAB — URINALYSIS, ROUTINE W REFLEX MICROSCOPIC
BILIRUBIN URINE: NEGATIVE
GLUCOSE, UA: NEGATIVE mg/dL
HGB URINE DIPSTICK: NEGATIVE
KETONES UR: NEGATIVE mg/dL
Leukocytes, UA: NEGATIVE
Nitrite: NEGATIVE
PROTEIN: NEGATIVE mg/dL
Specific Gravity, Urine: 1.02 (ref 1.005–1.030)
pH: 5 (ref 5.0–8.0)

## 2018-02-01 LAB — COMPREHENSIVE METABOLIC PANEL
ALK PHOS: 31 U/L — AB (ref 38–126)
ALT: 25 U/L (ref 0–44)
AST: 33 U/L (ref 15–41)
Albumin: 3.7 g/dL (ref 3.5–5.0)
Anion gap: 11 (ref 5–15)
BILIRUBIN TOTAL: 0.5 mg/dL (ref 0.3–1.2)
BUN: 19 mg/dL (ref 8–23)
CALCIUM: 9.3 mg/dL (ref 8.9–10.3)
CO2: 24 mmol/L (ref 22–32)
Chloride: 107 mmol/L (ref 98–111)
Creatinine, Ser: 1.28 mg/dL — ABNORMAL HIGH (ref 0.61–1.24)
GFR calc Af Amer: >60 mL/min (ref >60)
GFR calc non Af Amer: 54 mL/min — ABNORMAL LOW (ref >60)
GLUCOSE: 108 mg/dL — AB (ref 70–99)
Potassium: 3.5 mmol/L (ref 3.5–5.1)
Sodium: 142 mmol/L (ref 135–145)
TOTAL PROTEIN: 7.5 g/dL (ref 6.5–8.1)

## 2018-02-01 LAB — CBC WITH DIFFERENTIAL/PLATELET
Abs Immature Granulocytes: 0.1 10*3/uL (ref 0.0–0.1)
Basophils Absolute: 0.1 10*3/uL (ref 0.0–0.1)
Basophils Relative: 1 %
EOS PCT: 2 %
Eosinophils Absolute: 0.4 10*3/uL (ref 0.0–0.7)
HEMATOCRIT: 39.7 % (ref 39.0–52.0)
HEMOGLOBIN: 12.6 g/dL — AB (ref 13.0–17.0)
IMMATURE GRANULOCYTES: 1 %
LYMPHS ABS: 2.1 10*3/uL (ref 0.7–4.0)
LYMPHS PCT: 14 %
MCH: 29.8 pg (ref 26.0–34.0)
MCHC: 31.7 g/dL (ref 30.0–36.0)
MCV: 93.9 fL (ref 78.0–100.0)
Monocytes Absolute: 1.2 10*3/uL — ABNORMAL HIGH (ref 0.1–1.0)
Monocytes Relative: 7 %
NEUTROS PCT: 75 %
Neutro Abs: 11.8 10*3/uL — ABNORMAL HIGH (ref 1.7–7.7)
Platelets: 286 10*3/uL (ref 150–400)
RBC: 4.23 MIL/uL (ref 4.22–5.81)
RDW: 14.2 % (ref 11.5–15.5)
WBC: 15.6 10*3/uL — AB (ref 4.0–10.5)

## 2018-02-01 LAB — PROTIME-INR
INR: 1.18
Prothrombin Time: 14.9 seconds (ref 11.4–15.2)

## 2018-02-01 LAB — SURGICAL PCR SCREEN
MRSA, PCR: NEGATIVE
Staphylococcus aureus: NEGATIVE

## 2018-02-01 LAB — APTT: aPTT: 33 seconds (ref 24–36)

## 2018-02-01 NOTE — Telephone Encounter (Signed)
Dana from the [facility] called, surgery scheduled 02/12/18, positive red cell antibody, per Dr. Cleophas DunkerWhitfield - reserve 2 units, I called Annabelle HarmanDana to advise  - [phone].

## 2018-02-01 NOTE — Progress Notes (Signed)
PCP - Dr Sherryll BurgerShah Cardiologist - Darl HouseholderKoneswaren Pulmonary- Dr. Vassie LollAlva  Chest x-ray - 12/11/17, Chest CT 01/07/18 EKG - 12/07/17 Stress Test - 12/21/17 ECHO - 2015 Cardiac Cath - denies cath  Aspirin Instructions: pt daughter to call Dr. Cleophas DunkerWhitfield regarding stopping Aspirin  Anesthesia review: pulmonary and heart history. Pt is SOB with minimal exertion, wears O2 at home with exertion (4Lnc) Pt O2 sat 96% today on Room air. Ina HomesNotified James, [place] with anesthesia of SOB at PAT appointment. This SOB is not new to patient. Pt states he feels as if he is at his baseline and is not acutely sick. Pt reports chest pain in the past which is relieved by Nitro, and Dr. Darl HouseholderKoneswaren is aware of this chest pain. Pt states he has not had any in the past couple of months.   Pt states he will call MD if he feels as if his breathing gets worse, or feels like he is getting acutely sick.   Patient denies shortness of breath, fever, cough and chest pain at PAT appointment   Patient verbalized understanding of instructions that were given to them at the PAT appointment. Patient was also instructed that they will need to review over the PAT instructions again at home before surgery.

## 2018-02-02 LAB — URINE CULTURE
CULTURE: NO GROWTH
SPECIAL REQUESTS: NORMAL

## 2018-02-04 NOTE — Progress Notes (Addendum)
Anesthesia Chart Review:  Case:  [id number] Date/Time:  02/12/18 0700   Procedure:  LEFT TOTAL HIP ARTHROPLASTY (Left )   Anesthesia type:  Choice   Pre-op diagnosis:  LEFT TOTAL HIP ARTHROPLASTY   Location:  [facility] ROOM 07 / [facility] OR   Surgeon:  Valeria Batman, MD      DISCUSSION: 72 yo male former smoker for above procedure. Pertinent hx includes COPD, PAD (status post a right axilla femoral and right to left femoral femoral bypass graft in March 2012), Asthma, HTN, HLD, O2 use (4L via [place] with exertion), Carotid stenosis, NSTEMI, CAD (Based on abnormal Myoview - inferior scar).  Pt has clearance from cardiology Dr. Purvis Sheffield on 12/07/2017. Per his note: "He is certainly at an increased risk for major adverse cardiac events in the perioperative period due to both coronary artery disease and peripheral arterial disease as well as COPD.  I will obtain a dobutamine Myoview nuclear stress test to evaluate for any areas of significant ischemia." -Myoview 12/21/2017 showed EF 58%, no ST segment deviation, findings consistent with prior MI as seen previously, Low Risk study.  Pt has clearance from pulmonology Dr. Vassie Loll on 12/11/2017. Per his note: "Surprisingly, lung function is maintained at 66%.Marland KitchenMarland KitchenYou are cleared for surgery with due risk. Continue on Anoro once daily, can use albuterol 2 puffs as needed for shortness of breath or wheezing Can use duo nebs if hospitalized post surgery. We will also need DVT prophylaxis as would be routine with such surgery"  At PAT appt he reported SOB with minimal exertion which he states is his baseline. Anticipate can proceed with surgery as planned barring acute status change.  VS: BP 127/62   Pulse (!) 102   Temp 36.4 C   Resp 20   Ht 5\' 6"  (1.676 m)   Wt 188 lb (85.3 kg)   SpO2 96%   BMI 30.34 kg/m   PROVIDERS: Kirstie Peri, MD is PCP last seen 01/28/2018  Cyril Mourning, MD is Pulmonologist last seen 12/11/2017  Prentice Docker, MD is Cardiologist las  seen 12/07/2017  LABS: Mild leukocytosis. Review of previous labs shows similar. Pt denies current or recent illness. Labwork 05/17/2017 by Dr. Sherryll Burger notes elevated WBC 10.9 with comment "will follow". Dr. Hoy Register office notified. Labs routed to Dr. Sherryll Burger.  WBC (K/uL)  Date Value  02/01/2018 15.6 (H)  12/30/2013 12.4 (H)  12/29/2013 15.4 (H)  09/22/2010 11.4 (H)  09/21/2010 12.2 (H)   (all labs ordered are listed, but only abnormal results are displayed)  Labs Reviewed  CBC WITH DIFFERENTIAL/PLATELET - Abnormal; Notable for the following components:      Result Value   WBC 15.6 (*)    Hemoglobin 12.6 (*)    Neutro Abs 11.8 (*)    Monocytes Absolute 1.2 (*)    All other components within normal limits  COMPREHENSIVE METABOLIC PANEL - Abnormal; Notable for the following components:   Glucose, Bld 108 (*)    Creatinine, Ser 1.28 (*)    Alkaline Phosphatase 31 (*)    GFR calc non Af Amer 54 (*)    All other components within normal limits  URINALYSIS, ROUTINE W REFLEX MICROSCOPIC - Abnormal; Notable for the following components:   APPearance HAZY (*)    All other components within normal limits  SURGICAL PCR SCREEN  URINE CULTURE  APTT  PROTIME-INR  TYPE AND SCREEN     IMAGES: Chest CT 01/07/2018 IMPRESSION: Chronic obstructive pulmonary type changes with centrilobular emphysema most notable  upper lung zones. Bilateral lower lobe peribronchial thickening.  Bilateral lower lobe and right middle lobe scarring/atelectasis similar to prior exam. No new worrisome lung mass identified.  Increase in size and number of normal to slightly prominent size mediastinal lymph nodes as detailed above. Given this slight change in addition to patient's long history of smoking, follow-up chest CT in 6-12 months may be considered.  Coronary artery calcifications   EKG: 12/07/2017: Sinus  Rhythm. Right bundle branch block.   CV: Myoview 12/21/2017:  There was no ST segment  deviation noted during stress.  Defect 1: There is a medium defect of moderate severity present in the mid inferior and apical inferior location.  Findings consistent with prior myocardial infarction.  This is a low risk study.  Nuclear stress EF: 58%.  Carotid Duplex 05/19/2016: 1.  1 to 39% bilateral internal carotid artery stenosis. 2.  Right external carotid artery stenosis.  Echo 12/29/2013: Study Conclusions  - Procedure narrative: Transthoracic echocardiography. Image quality was poor, due to poor sound transmission. - Left ventricle: Systolic function was difficult to assess, but appeared grossly normal. Estimated EF 50-55%. The cavity size was normal. Wall thickness was increased in a pattern of mild LVH. Images were inadequate for LV wall motion assessment. There was an increased relative contribution of atrial contraction to ventricular filling. Doppler parameters are consistent with abnormal left ventricular relaxation (grade 1 diastolic dysfunction). Doppler parameters are consistent with high ventricular filling pressure.  Impressions:  - Technically difficult study due to poor sound transmission. Poor valvular visualization and endocardial definition. Would recommend limited echo with contrast enhancement for a more accurate assessment of left ventricular systolic function and regional wall motion.  PULM: Spirometry shows moderate airway obstruction with ratio 49, FEV1 of 66% and FVC of 96%    Past Medical History:  Diagnosis Date  . Asthma   . Carotid artery disease (HCC)    50-69% RICA and less than 50% LICA, 07/2011  . COPD (chronic obstructive pulmonary disease) (HCC)   . Coronary atherosclerosis of native coronary artery    Based on abnormal Myoview - inferior scar, 05/2011; LVEF 45-50%, echo, 07/2011  . Essential hypertension, benign   . HLD (hyperlipidemia)   . NSTEMI (non-ST elevated myocardial infarction) (HCC)     12/12, minor  . On home oxygen therapy    4Lnc with exertion  . Peripheral arterial disease (HCC)   . Tobacco abuse     Past Surgical History:  Procedure Laterality Date  . ABDOMINAL SURGERY     2008  . COLONOSCOPY    . Right axillary to femoral bypass  3/12   Dr. Hart Rochester  . RIGHT HYDROCELE REPAIR    . Right to left femoral to femoral bypass  3/12   Dr. Hart Rochester  . SIGMOID RESECTION / RECTOPEXY  2005  . skin abscess drainage      MEDICATIONS: . Menthol, Topical Analgesic, (BENGAY EX)  . tetrahydrozoline (VISINE) 0.05 % ophthalmic solution  . albuterol (PROAIR HFA) 108 (90 BASE) MCG/ACT inhaler  . albuterol (PROVENTIL) (2.5 MG/3ML) 0.083% nebulizer solution  . aspirin EC 81 MG tablet  . hydrochlorothiazide (HYDRODIURIL) 25 MG tablet  . HYDROcodone-acetaminophen (NORCO/VICODIN) 5-325 MG tablet  . isosorbide mononitrate (IMDUR) 60 MG 24 hr tablet  . lisinopril-hydrochlorothiazide (PRINZIDE,ZESTORETIC) 20-12.5 MG tablet  . nitroGLYCERIN (NITROSTAT) 0.4 MG SL tablet  . OXYGEN  . simvastatin (ZOCOR) 20 MG tablet  . umeclidinium-vilanterol (ANORO ELLIPTA) 62.5-25 MCG/INH AEPB   No current facility-administered medications for this  encounter.      Zannie CoveJames Burns, PA-C [facility] Phone [phone] 02/05/2018 12:39 PM

## 2018-02-05 ENCOUNTER — Encounter (INDEPENDENT_AMBULATORY_CARE_PROVIDER_SITE_OTHER): Payer: Self-pay | Admitting: Orthopedic Surgery

## 2018-02-05 ENCOUNTER — Ambulatory Visit (INDEPENDENT_AMBULATORY_CARE_PROVIDER_SITE_OTHER): Payer: Medicare Other | Admitting: Orthopedic Surgery

## 2018-02-05 ENCOUNTER — Telehealth (INDEPENDENT_AMBULATORY_CARE_PROVIDER_SITE_OTHER): Payer: Self-pay | Admitting: Orthopaedic Surgery

## 2018-02-05 VITALS — BP 180/81 | HR 92 | Temp 97.9°F | Ht 64.0 in | Wt 183.0 lb

## 2018-02-05 DIAGNOSIS — M87052 Idiopathic aseptic necrosis of left femur: Secondary | ICD-10-CM | POA: Diagnosis not present

## 2018-02-05 DIAGNOSIS — M1612 Unilateral primary osteoarthritis, left hip: Secondary | ICD-10-CM | POA: Diagnosis not present

## 2018-02-05 NOTE — Progress Notes (Signed)
Office Visit Note   Patient: [name]           Date of Birth: September 03, 1945           MRN: [id number] Visit Date: 02/05/2018              Subjective:  Chief Complaint: left hip pain  HPI: Tom LosBillie R Dirosa, 72 y.o. male, has a history of pain and functional disability in the left hip(s) due to arthritis and patient has failed non-surgical conservative treatments for greater than 12 weeks to include NSAID's and/or analgesics, corticosteriod injections, supervised PT with diminished ADL's post treatment, use of assistive devices, weight reduction as appropriate and activity modification.  Onset of symptoms was gradual starting 3 years ago with gradually worsening course since that time.The patient noted no past surgery on the left hip(s).  Patient currently rates pain in the left hip at 8 out of 10 with activity. Patient has night pain, worsening of pain with activity and weight bearing, trendelenberg gait, pain that interfers with activities of daily living and pain with passive range of motion. Patient has evidence of subchondral cysts, subchondral sclerosis, periarticular osteophytes and joint space narrowing by imaging studies. This condition presents safety issues increasing the risk of falls.   There is no current active infection.  Patient Active Problem List   Diagnosis Date Noted  . Unilateral primary osteoarthritis, left hip 01/09/2018  . Atherosclerosis of native arteries of the extremities with intermittent claudication 03/30/2014  . Aftercare following surgery of the circulatory system, NEC 03/30/2014  . PAD (peripheral artery disease) (HCC) 03/30/2014  . Muscle cramps- Right  Flank / Sharyne Peachrubk 03/30/2014  . Paresthesia of right foot 03/30/2014  . Chest pain 12/30/2013  . Unstable angina (HCC) 12/29/2013  . Chest pain at rest 12/29/2013  . Chronic respiratory failure (HCC) 12/29/2013  . COPD (chronic obstructive pulmonary disease) (HCC)   . HTN (hypertension), malignant 10/07/2013   . Dyspnea 03/06/2013  . Tobacco abuse   . HLD (hyperlipidemia)   . Peripheral vascular disease, unspecified (HCC) 03/26/2012  . Coronary atherosclerosis of native coronary artery 07/28/2011  . Mixed hyperlipidemia 07/28/2011  . Essential hypertension, benign 07/28/2011  . Peripheral arterial disease (HCC) 07/28/2011  . Carotid artery disease (HCC) 07/28/2011   Past Medical History:  Diagnosis Date  . Asthma   . Carotid artery disease (HCC)    50-69% RICA and less than 50% LICA, 07/2011  . COPD (chronic obstructive pulmonary disease) (HCC)   . Coronary atherosclerosis of native coronary artery    Based on abnormal Myoview - inferior scar, 05/2011; LVEF 45-50%, echo, 07/2011  . Essential hypertension, benign   . HLD (hyperlipidemia)   . NSTEMI (non-ST elevated myocardial infarction) (HCC)    12/12, minor  . On home oxygen therapy    4Lnc with exertion  . Peripheral arterial disease (HCC)   . Tobacco abuse     Past Surgical History:  Procedure Laterality Date  . ABDOMINAL SURGERY     2008  . COLONOSCOPY    . Right axillary to femoral bypass  3/12   Dr. Hart RochesterLawson  . RIGHT HYDROCELE REPAIR    . Right to left femoral to femoral bypass  3/12   Dr. Hart RochesterLawson  . SIGMOID RESECTION / RECTOPEXY  2005  . skin abscess drainage      No current facility-administered medications for this encounter.    Current Outpatient Medications  Medication Sig Dispense Refill Last Dose  . albuterol (PROAIR HFA)  108 (90 BASE) MCG/ACT inhaler Inhale 2 puffs into the lungs every 6 (six) hours as needed for shortness of breath.    Taking  . albuterol (PROVENTIL) (2.5 MG/3ML) 0.083% nebulizer solution Take 2.5 mg by nebulization every 6 (six) hours as needed for shortness of breath.    Taking  . aspirin EC 81 MG tablet Take 1 tablet (81 mg total) by mouth daily.   Taking  . hydrochlorothiazide (HYDRODIURIL) 25 MG tablet Take 1 tablet (25 mg total) by mouth daily. 7 tablet 0 Taking  . HYDROcodone-acetaminophen  (NORCO/VICODIN) 5-325 MG tablet Take 1 tablet by mouth 2 (two) times daily.  0 Taking  . isosorbide mononitrate (IMDUR) 60 MG 24 hr tablet Take 1 tablet (60 mg total) by mouth daily. 90 tablet 3 Taking  . lisinopril-hydrochlorothiazide (PRINZIDE,ZESTORETIC) 20-12.5 MG tablet Take 1 tablet by mouth daily.   Taking  . Menthol, Topical Analgesic, (BENGAY EX) Apply topically.   Taking  . nitroGLYCERIN (NITROSTAT) 0.4 MG SL tablet Place 1 tablet (0.4 mg total) under the tongue every 5 (five) minutes x 3 doses as needed for chest pain. 25 tablet 3 Taking  . OXYGEN Inhale 4 L into the lungs.   Taking  . simvastatin (ZOCOR) 20 MG tablet TAKE 1 TABLET BY MOUTH EVERY DAY 10 tablet 0 Taking  . tetrahydrozoline (VISINE) 0.05 % ophthalmic solution    Taking  . umeclidinium-vilanterol (ANORO ELLIPTA) 62.5-25 MCG/INH AEPB Inhale 1 puff daily into the lungs.   Taking   No Known Allergies  Social History   Tobacco Use  . Smoking status: Former Smoker    Packs/day: 2.00    Years: 55.00    Pack years: 110.00    Types: Cigarettes    Start date: 12/03/1959    Last attempt to quit: 12/09/2010    Years since quitting: 7.1  . Smokeless tobacco: Never Used  . Tobacco comment: Patient has not somked since midnight on wednesday.   Substance Use Topics  . Alcohol use: No    Alcohol/week: 0.0 oz    Family History  Problem Relation Age of Onset  . Heart attack Father   . Heart attack Mother      ROS  Review of Systems  Constitutional: Negative for fatigue and fever.  HENT: Negative for ear pain.   Eyes: Negative for pain.  Respiratory: Positive for shortness of breath. Negative for cough.   Cardiovascular: Negative for leg swelling.  Gastrointestinal: Negative for constipation and diarrhea.  Genitourinary: Negative for difficulty urinating.  Musculoskeletal: Negative for back pain and neck pain.  Skin: Negative for rash.  Allergic/Immunologic: Negative for food allergies.  Neurological: Positive for  weakness and numbness.  Hematological: Does not bruise/bleed easily.  Psychiatric/Behavioral: Negative for sleep disturbance.   Objective:  Physical Exam  Constitutional: He is oriented to person, place, and time. He appears well-developed and well-nourished.  HENT:  Head: Normocephalic and atraumatic.  Nose: Nose normal.  Mouth/Throat: Oropharynx is clear and moist.  Eyes: Pupils are equal, round, and reactive to light. Conjunctivae and EOM are normal.  Cardiovascular: Normal rate and regular rhythm.  Distant heart sounds  Respiratory: He is in respiratory distress (in supine position). He has wheezes (expiratory).  GI: Soft. Bowel sounds are normal. He exhibits mass (hernia x 2).  Neurological: He is alert and oriented to person, place, and time.  Skin: Skin is warm and dry.  Psychiatric: He has a normal mood and affect. His behavior is normal. Judgment and thought content normal.  Vital signs in last 24 hours: Pulse Rate:  [92] 92 (07/30 1136) BP: (180)/(81) 180/81 (07/30 1136) Weight:  [183 lb (83 kg)] 183 lb (83 kg) (07/30 1136)  Labs:   Estimated body mass index is 31.41 kg/m as calculated from the following:   Height as of 02/05/18: 5\' 4"  (1.626 m).   Weight as of 02/05/18: 183 lb (83 kg).   Imaging Review Plain radiographs demonstrate severe degenerative joint disease of the left hip(s). The bone quality appears to be good for age and reported activity level.    Preoperative templating of the joint replacement has been completed, documented, and submitted to the Operating Room personnel in order to optimize intra-operative equipment management.     Assessment/Plan:  End stage arthritis, left hip(s)  The patient history, physical examination, clinical judgement of the provider and imaging studies are consistent with end stage degenerative joint disease of the left hip(s) and total hip arthroplasty is deemed medically necessary. The treatment options including  medical management, injection therapy, arthroscopy and arthroplasty were discussed at length. The risks and benefits of total hip arthroplasty were presented and reviewed. The risks due to aseptic loosening, infection, stiffness, dislocation/subluxation,  thromboembolic complications and other imponderables were discussed.  The patient acknowledged the explanation, agreed to proceed with the plan and consent was signed. Patient is being admitted for inpatient treatment for surgery, pain control, PT, OT, prophylactic antibiotics, VTE prophylaxis, progressive ambulation and ADL's and discharge planning.The patient is planning to be discharged to skilled nursing facility   Anticipated LOS equal to or greater than 2 midnights due to - Age 22 and older with one or more of the following:  - Obesity  - Expected need for hospital services (PT, OT, Nursing) required for safe  discharge  - Anticipated need for postoperative skilled nursing care or inpatient rehab  - Active co-morbidities: Chronic pain requiring opiods, Coronary Artery Disease and Heart Attack OR     - Patient is a high risk of re-admission due to: Barriers to post-acute care (logistical, no family support in home)  Face-to-face time spent with patient was greater than 40 minutes.  Greater than 50% of the time was spent in counseling and coordination of care.    Oris Drone Aleda Grana [facility] [phone]  02/05/2018 12:43 PM

## 2018-02-05 NOTE — Telephone Encounter (Signed)
Sent message to brian to review

## 2018-02-05 NOTE — Telephone Encounter (Signed)
Antionette PolesJames Burns, PA with [facility] calling to give Dr Cleophas DunkerWhitfield heads up on this patient's pre-admission testing.  Patient has mildly elevated WBC @ 15.6    Looking at patient's history he has had similar values in the past.  Just making aware in case Dr. Cleophas DunkerWhitfield would like to order same day labs.  Patient is currently scheduled for 02-12-18 for L-THA, but will be rescheduled to a later date (possibly 02-19-18) should this work for the family.

## 2018-02-05 NOTE — H&P (Signed)
TOTAL HIP ADMISSION H&P  Patient is admitted for left total hip arthroplasty.  Subjective:  Chief Complaint: left hip pain  HPI: [name], 72 y.o. male, has a history of pain and functional disability in the left hip(s) due to arthritis and patient has failed non-surgical conservative treatments for greater than 12 weeks to include NSAID's and/or analgesics, corticosteriod injections, supervised PT with diminished ADL's post treatment, use of assistive devices, weight reduction as appropriate and activity modification.  Onset of symptoms was gradual starting 3 years ago with gradually worsening course since that time.The patient noted no past surgery on the left hip(s).  Patient currently rates pain in the left hip at 8 out of 10 with activity. Patient has night pain, worsening of pain with activity and weight bearing, trendelenberg gait, pain that interfers with activities of daily living and pain with passive range of motion. Patient has evidence of subchondral cysts, subchondral sclerosis, periarticular osteophytes and joint space narrowing by imaging studies. This condition presents safety issues increasing the risk of falls.   There is no current active infection.  Patient Active Problem List   Diagnosis Date Noted  . Unilateral primary osteoarthritis, left hip 01/09/2018  . Atherosclerosis of native arteries of the extremities with intermittent claudication 03/30/2014  . Aftercare following surgery of the circulatory system, NEC 03/30/2014  . PAD (peripheral artery disease) (HCC) 03/30/2014  . Muscle cramps- Right  Flank / Sharyne Peach 03/30/2014  . Paresthesia of right foot 03/30/2014  . Chest pain 12/30/2013  . Unstable angina (HCC) 12/29/2013  . Chest pain at rest 12/29/2013  . Chronic respiratory failure (HCC) 12/29/2013  . COPD (chronic obstructive pulmonary disease) (HCC)   . HTN (hypertension), malignant 10/07/2013  . Dyspnea 03/06/2013  . Tobacco abuse   . HLD (hyperlipidemia)    . Peripheral vascular disease, unspecified (HCC) 03/26/2012  . Coronary atherosclerosis of native coronary artery 07/28/2011  . Mixed hyperlipidemia 07/28/2011  . Essential hypertension, benign 07/28/2011  . Peripheral arterial disease (HCC) 07/28/2011  . Carotid artery disease (HCC) 07/28/2011   Past Medical History:  Diagnosis Date  . Asthma   . Carotid artery disease (HCC)    50-69% RICA and less than 50% LICA, 07/2011  . COPD (chronic obstructive pulmonary disease) (HCC)   . Coronary atherosclerosis of native coronary artery    Based on abnormal Myoview - inferior scar, 05/2011; LVEF 45-50%, echo, 07/2011  . Essential hypertension, benign   . HLD (hyperlipidemia)   . NSTEMI (non-ST elevated myocardial infarction) (HCC)    12/12, minor  . On home oxygen therapy    4Lnc with exertion  . Peripheral arterial disease (HCC)   . Tobacco abuse     Past Surgical History:  Procedure Laterality Date  . ABDOMINAL SURGERY     2008  . COLONOSCOPY    . Right axillary to femoral bypass  3/12   Dr. Hart Rochester  . RIGHT HYDROCELE REPAIR    . Right to left femoral to femoral bypass  3/12   Dr. Hart Rochester  . SIGMOID RESECTION / RECTOPEXY  2005  . skin abscess drainage      No current facility-administered medications for this encounter.    Current Outpatient Medications  Medication Sig Dispense Refill Last Dose  . albuterol (PROAIR HFA) 108 (90 BASE) MCG/ACT inhaler Inhale 2 puffs into the lungs every 6 (six) hours as needed for shortness of breath.    Taking  . albuterol (PROVENTIL) (2.5 MG/3ML) 0.083% nebulizer solution Take 2.5 mg by  nebulization every 6 (six) hours as needed for shortness of breath.    Taking  . aspirin EC 81 MG tablet Take 1 tablet (81 mg total) by mouth daily.   Taking  . hydrochlorothiazide (HYDRODIURIL) 25 MG tablet Take 1 tablet (25 mg total) by mouth daily. 7 tablet 0 Taking  . HYDROcodone-acetaminophen (NORCO/VICODIN) 5-325 MG tablet Take 1 tablet by mouth 2 (two)  times daily.  0 Taking  . isosorbide mononitrate (IMDUR) 60 MG 24 hr tablet Take 1 tablet (60 mg total) by mouth daily. 90 tablet 3 Taking  . lisinopril-hydrochlorothiazide (PRINZIDE,ZESTORETIC) 20-12.5 MG tablet Take 1 tablet by mouth daily.   Taking  . Menthol, Topical Analgesic, (BENGAY EX) Apply topically.   Taking  . nitroGLYCERIN (NITROSTAT) 0.4 MG SL tablet Place 1 tablet (0.4 mg total) under the tongue every 5 (five) minutes x 3 doses as needed for chest pain. 25 tablet 3 Taking  . OXYGEN Inhale 4 L into the lungs.   Taking  . simvastatin (ZOCOR) 20 MG tablet TAKE 1 TABLET BY MOUTH EVERY DAY 10 tablet 0 Taking  . tetrahydrozoline (VISINE) 0.05 % ophthalmic solution    Taking  . umeclidinium-vilanterol (ANORO ELLIPTA) 62.5-25 MCG/INH AEPB Inhale 1 puff daily into the lungs.   Taking   No Known Allergies  Social History   Tobacco Use  . Smoking status: Former Smoker    Packs/day: 2.00    Years: 55.00    Pack years: 110.00    Types: Cigarettes    Start date: 12/03/1959    Last attempt to quit: 12/09/2010    Years since quitting: 7.1  . Smokeless tobacco: Never Used  . Tobacco comment: Patient has not somked since midnight on wednesday.   Substance Use Topics  . Alcohol use: No    Alcohol/week: 0.0 oz    Family History  Problem Relation Age of Onset  . Heart attack Father   . Heart attack Mother      ROS  Review of Systems  Constitutional: Negative for fatigue and fever.  HENT: Negative for ear pain.   Eyes: Negative for pain.  Respiratory: Positive for shortness of breath. Negative for cough.   Cardiovascular: Negative for leg swelling.  Gastrointestinal: Negative for constipation and diarrhea.  Genitourinary: Negative for difficulty urinating.  Musculoskeletal: Negative for back pain and neck pain.  Skin: Negative for rash.  Allergic/Immunologic: Negative for food allergies.  Neurological: Positive for weakness and numbness.  Hematological: Does not bruise/bleed  easily.  Psychiatric/Behavioral: Negative for sleep disturbance.   Objective:  Physical Exam  Constitutional: He is oriented to person, place, and time. He appears well-developed and well-nourished.  HENT:  Head: Normocephalic and atraumatic.  Nose: Nose normal.  Mouth/Throat: Oropharynx is clear and moist.  Eyes: Pupils are equal, round, and reactive to light. Conjunctivae and EOM are normal.  Cardiovascular: Normal rate and regular rhythm.  Distant heart sounds  Respiratory: He is in respiratory distress (in supine position). He has wheezes (expiratory).  GI: Soft. Bowel sounds are normal. He exhibits mass (hernia x 2).  Neurological: He is alert and oriented to person, place, and time.  Skin: Skin is warm and dry.  Psychiatric: He has a normal mood and affect. His behavior is normal. Judgment and thought content normal.    Vital signs in last 24 hours: Pulse Rate:  [92] 92 (07/30 1136) BP: (180)/(81) 180/81 (07/30 1136) Weight:  [183 lb (83 kg)] 183 lb (83 kg) (07/30 1136)  Labs:   Estimated  body mass index is 31.41 kg/m as calculated from the following:   Height as of 02/05/18: 5\' 4"  (1.626 m).   Weight as of 02/05/18: 183 lb (83 kg).   Imaging Review Plain radiographs demonstrate severe degenerative joint disease of the left hip(s). The bone quality appears to be good for age and reported activity level.    Preoperative templating of the joint replacement has been completed, documented, and submitted to the Operating Room personnel in order to optimize intra-operative equipment management.     Assessment/Plan:  End stage arthritis, left hip(s)  The patient history, physical examination, clinical judgement of the provider and imaging studies are consistent with end stage degenerative joint disease of the left hip(s) and total hip arthroplasty is deemed medically necessary. The treatment options including medical management, injection therapy, arthroscopy and  arthroplasty were discussed at length. The risks and benefits of total hip arthroplasty were presented and reviewed. The risks due to aseptic loosening, infection, stiffness, dislocation/subluxation,  thromboembolic complications and other imponderables were discussed.  The patient acknowledged the explanation, agreed to proceed with the plan and consent was signed. Patient is being admitted for inpatient treatment for surgery, pain control, PT, OT, prophylactic antibiotics, VTE prophylaxis, progressive ambulation and ADL's and discharge planning.The patient is planning to be discharged to skilled nursing facility   Anticipated [name] equal to or greater than 2 midnights due to - Age 29 and older with one or more of the following:  - Obesity  - Expected need for hospital services (PT, OT, Nursing) required for safe  discharge  - Anticipated need for postoperative skilled nursing care or inpatient rehab  - Active co-morbidities: Chronic pain requiring opiods, Coronary Artery Disease and Heart Attack OR     - Patient is a high risk of re-admission due to: Barriers to post-acute care (logistical, no family support in home)    Oris Drone. Aleda Grana [facility] [phone]  02/05/2018 12:43 PM

## 2018-02-06 ENCOUNTER — Ambulatory Visit (INDEPENDENT_AMBULATORY_CARE_PROVIDER_SITE_OTHER): Payer: Medicare Other | Admitting: Orthopedic Surgery

## 2018-02-08 LAB — TYPE AND SCREEN
ABO/RH(D): A POS
Antibody Screen: POSITIVE
DONOR AG TYPE: NEGATIVE
DONOR AG TYPE: NEGATIVE
UNIT DIVISION: 0
Unit division: 0

## 2018-02-08 LAB — BPAM RBC
BLOOD PRODUCT EXPIRATION DATE: 201908092359
Blood Product Expiration Date: 201908102359
ISSUE DATE / TIME: 201907222211
UNIT TYPE AND RH: 6200
Unit Type and Rh: 6200

## 2018-02-18 ENCOUNTER — Other Ambulatory Visit: Payer: Self-pay

## 2018-02-18 MED ORDER — TRANEXAMIC ACID 1000 MG/10ML IV SOLN
2000.0000 mg | INTRAVENOUS | Status: AC
  Administered 2018-02-19: 2000 mg via TOPICAL
  Filled 2018-02-18: qty 20

## 2018-02-18 MED ORDER — ACETAMINOPHEN 10 MG/ML IV SOLN
1000.0000 mg | INTRAVENOUS | Status: AC
Start: 2018-02-19 — End: 2018-02-19
  Administered 2018-02-19: 1000 mg via INTRAVENOUS

## 2018-02-18 NOTE — Anesthesia Preprocedure Evaluation (Addendum)
Anesthesia Evaluation  Patient identified by MRN, date of birth, ID band Patient awake    Reviewed: Allergy & Precautions, NPO status , Patient's Chart, lab work & pertinent test results  Airway Mallampati: I  TM Distance: >3 FB Neck ROM: Full    Dental  (+) Edentulous Upper, Edentulous Lower   Pulmonary neg pulmonary ROS, asthma , COPD,  oxygen dependent, former smoker,    breath sounds clear to auscultation       Cardiovascular hypertension, Pt. on medications + angina + CAD, + Past MI and + Peripheral Vascular Disease   Rhythm:Regular Rate:Normal  Stress 12/21/2017 EF 58%, no ST segment deviation, findings consistent with prior MI as seen previously, Low Risk study.   Neuro/Psych negative neurological ROS     GI/Hepatic negative GI ROS, Neg liver ROS,   Endo/Other  negative endocrine ROS  Renal/GU negative Renal ROS     Musculoskeletal  (+) Arthritis , Osteoarthritis,    Abdominal   Peds  Hematology negative hematology ROS (+)   Anesthesia Other Findings Day of surgery medications reviewed with the patient.  Reproductive/Obstetrics                           Anesthesia Physical Anesthesia Plan  ASA: III  Anesthesia Plan: Spinal and General   Post-op Pain Management:    Induction: Intravenous  PONV Risk Score and Plan: 1 and Ondansetron and Dexamethasone  Airway Management Planned: Simple Face Mask and Natural Airway  Additional Equipment:   Intra-op Plan:   Post-operative Plan:   Informed Consent: I have reviewed the patients History and Physical, chart, labs and discussed the procedure including the risks, benefits and alternatives for the proposed anesthesia with the patient or authorized representative who has indicated his/her understanding and acceptance.   Dental advisory given  Plan Discussed with: CRNA  Anesthesia Plan Comments:        Anesthesia Quick  Evaluation

## 2018-02-19 ENCOUNTER — Inpatient Hospital Stay (HOSPITAL_COMMUNITY): Payer: Medicare Other | Admitting: Certified Registered"

## 2018-02-19 ENCOUNTER — Inpatient Hospital Stay (HOSPITAL_COMMUNITY)
Admission: RE | Admit: 2018-02-19 | Discharge: 2018-02-23 | DRG: 469 | Disposition: A | Discharge status: Skilled Nursing Facility | Payer: Medicare Other | Source: Ambulatory Visit | Attending: Orthopaedic Surgery | Admitting: Orthopaedic Surgery

## 2018-02-19 ENCOUNTER — Encounter (HOSPITAL_COMMUNITY): Payer: Self-pay | Admitting: *Deleted

## 2018-02-19 ENCOUNTER — Encounter (HOSPITAL_COMMUNITY)
Admission: RE | Disposition: A | Discharge status: Skilled Nursing Facility | Payer: Self-pay | Source: Ambulatory Visit | Attending: Orthopaedic Surgery

## 2018-02-19 ENCOUNTER — Inpatient Hospital Stay (HOSPITAL_COMMUNITY): Payer: Medicare Other | Admitting: Physician Assistant

## 2018-02-19 ENCOUNTER — Inpatient Hospital Stay (HOSPITAL_COMMUNITY): Payer: Medicare Other

## 2018-02-19 DIAGNOSIS — K567 Ileus, unspecified: Secondary | ICD-10-CM | POA: Diagnosis not present

## 2018-02-19 DIAGNOSIS — Z7982 Long term (current) use of aspirin: Secondary | ICD-10-CM

## 2018-02-19 DIAGNOSIS — Z9981 Dependence on supplemental oxygen: Secondary | ICD-10-CM

## 2018-02-19 DIAGNOSIS — I252 Old myocardial infarction: Secondary | ICD-10-CM

## 2018-02-19 DIAGNOSIS — J9621 Acute and chronic respiratory failure with hypoxia: Secondary | ICD-10-CM | POA: Diagnosis not present

## 2018-02-19 DIAGNOSIS — M1612 Unilateral primary osteoarthritis, left hip: Principal | ICD-10-CM | POA: Diagnosis present

## 2018-02-19 DIAGNOSIS — J441 Chronic obstructive pulmonary disease with (acute) exacerbation: Secondary | ICD-10-CM | POA: Diagnosis not present

## 2018-02-19 DIAGNOSIS — Z96649 Presence of unspecified artificial hip joint: Secondary | ICD-10-CM

## 2018-02-19 DIAGNOSIS — I739 Peripheral vascular disease, unspecified: Secondary | ICD-10-CM | POA: Diagnosis present

## 2018-02-19 DIAGNOSIS — Z9049 Acquired absence of other specified parts of digestive tract: Secondary | ICD-10-CM | POA: Diagnosis not present

## 2018-02-19 DIAGNOSIS — M879 Osteonecrosis, unspecified: Secondary | ICD-10-CM | POA: Diagnosis present

## 2018-02-19 DIAGNOSIS — Z6831 Body mass index (BMI) 31.0-31.9, adult: Secondary | ICD-10-CM

## 2018-02-19 DIAGNOSIS — I10 Essential (primary) hypertension: Secondary | ICD-10-CM | POA: Diagnosis present

## 2018-02-19 DIAGNOSIS — E782 Mixed hyperlipidemia: Secondary | ICD-10-CM | POA: Diagnosis present

## 2018-02-19 DIAGNOSIS — I25110 Atherosclerotic heart disease of native coronary artery with unstable angina pectoris: Secondary | ICD-10-CM | POA: Diagnosis present

## 2018-02-19 DIAGNOSIS — M25552 Pain in left hip: Secondary | ICD-10-CM | POA: Diagnosis present

## 2018-02-19 DIAGNOSIS — E876 Hypokalemia: Secondary | ICD-10-CM | POA: Diagnosis not present

## 2018-02-19 DIAGNOSIS — J449 Chronic obstructive pulmonary disease, unspecified: Secondary | ICD-10-CM

## 2018-02-19 DIAGNOSIS — Z8249 Family history of ischemic heart disease and other diseases of the circulatory system: Secondary | ICD-10-CM

## 2018-02-19 DIAGNOSIS — K9189 Other postprocedural complications and disorders of digestive system: Secondary | ICD-10-CM

## 2018-02-19 DIAGNOSIS — M87051 Idiopathic aseptic necrosis of right femur: Secondary | ICD-10-CM

## 2018-02-19 DIAGNOSIS — K432 Incisional hernia without obstruction or gangrene: Secondary | ICD-10-CM | POA: Diagnosis present

## 2018-02-19 DIAGNOSIS — D62 Acute posthemorrhagic anemia: Secondary | ICD-10-CM | POA: Diagnosis not present

## 2018-02-19 DIAGNOSIS — R0603 Acute respiratory distress: Secondary | ICD-10-CM

## 2018-02-19 DIAGNOSIS — E669 Obesity, unspecified: Secondary | ICD-10-CM | POA: Diagnosis present

## 2018-02-19 DIAGNOSIS — Z87891 Personal history of nicotine dependence: Secondary | ICD-10-CM | POA: Diagnosis not present

## 2018-02-19 DIAGNOSIS — Z96641 Presence of right artificial hip joint: Secondary | ICD-10-CM

## 2018-02-19 DIAGNOSIS — J9610 Chronic respiratory failure, unspecified whether with hypoxia or hypercapnia: Secondary | ICD-10-CM | POA: Diagnosis present

## 2018-02-19 DIAGNOSIS — Z720 Tobacco use: Secondary | ICD-10-CM | POA: Diagnosis present

## 2018-02-19 DIAGNOSIS — Z96642 Presence of left artificial hip joint: Secondary | ICD-10-CM

## 2018-02-19 HISTORY — PX: TOTAL HIP ARTHROPLASTY: SHX124

## 2018-02-19 LAB — CBC
HCT: 34.9 % — ABNORMAL LOW (ref 39.0–52.0)
HEMOGLOBIN: 11.1 g/dL — AB (ref 13.0–17.0)
MCH: 29.7 pg (ref 26.0–34.0)
MCHC: 31.8 g/dL (ref 30.0–36.0)
MCV: 93.3 fL (ref 78.0–100.0)
Platelets: 230 10*3/uL (ref 150–400)
RBC: 3.74 MIL/uL — ABNORMAL LOW (ref 4.22–5.81)
RDW: 14.7 % (ref 11.5–15.5)
WBC: 11.3 10*3/uL — ABNORMAL HIGH (ref 4.0–10.5)

## 2018-02-19 LAB — BASIC METABOLIC PANEL
Anion gap: 10 (ref 5–15)
BUN: 22 mg/dL (ref 8–23)
CALCIUM: 8.9 mg/dL (ref 8.9–10.3)
CHLORIDE: 105 mmol/L (ref 98–111)
CO2: 25 mmol/L (ref 22–32)
CREATININE: 1.23 mg/dL (ref 0.61–1.24)
GFR, EST NON AFRICAN AMERICAN: 56 mL/min — AB (ref >60)
Glucose, Bld: 119 mg/dL — ABNORMAL HIGH (ref 70–99)
Potassium: 2.8 mmol/L — ABNORMAL LOW (ref 3.5–5.1)
SODIUM: 140 mmol/L (ref 135–145)

## 2018-02-19 SURGERY — ARTHROPLASTY, HIP, TOTAL,POSTERIOR APPROACH
Anesthesia: General | Site: Hip | Laterality: Left

## 2018-02-19 MED ORDER — SODIUM CHLORIDE 0.9 % IR SOLN
Status: DC | PRN
  Administered 2018-02-19: 3000 mL

## 2018-02-19 MED ORDER — PROPOFOL 10 MG/ML IV BOLUS
INTRAVENOUS | Status: DC | PRN
  Administered 2018-02-19: 50 mg via INTRAVENOUS
  Administered 2018-02-19 (×2): 10 mg via INTRAVENOUS
  Administered 2018-02-19: 20 mg via INTRAVENOUS

## 2018-02-19 MED ORDER — DOCUSATE SODIUM 100 MG PO CAPS
100.0000 mg | ORAL_CAPSULE | Freq: Two times a day (BID) | ORAL | Status: DC
  Administered 2018-02-19 – 2018-02-23 (×8): 100 mg via ORAL
  Filled 2018-02-19 (×8): qty 1

## 2018-02-19 MED ORDER — CHLORHEXIDINE GLUCONATE 4 % EX LIQD: 60.0000 mL | Freq: Once | CUTANEOUS | Status: DC

## 2018-02-19 MED ORDER — PHENYLEPHRINE 40 MCG/ML (10ML) SYRINGE FOR IV PUSH (FOR BLOOD PRESSURE SUPPORT)
PREFILLED_SYRINGE | INTRAVENOUS | Status: DC | PRN
  Administered 2018-02-19 (×3): 80 ug via INTRAVENOUS
  Administered 2018-02-19: 40 ug via INTRAVENOUS

## 2018-02-19 MED ORDER — HYDROCHLOROTHIAZIDE 12.5 MG PO CAPS
12.5000 mg | ORAL_CAPSULE | Freq: Every day | ORAL | Status: DC
  Administered 2018-02-20 – 2018-02-23 (×4): 12.5 mg via ORAL
  Filled 2018-02-19 (×4): qty 1

## 2018-02-19 MED ORDER — DEXAMETHASONE SODIUM PHOSPHATE 10 MG/ML IJ SOLN
INTRAMUSCULAR | Status: DC | PRN
  Administered 2018-02-19: 4 mg via INTRAVENOUS

## 2018-02-19 MED ORDER — PROPOFOL 10 MG/ML IV BOLUS
INTRAVENOUS | Status: AC
  Filled 2018-02-19: qty 20

## 2018-02-19 MED ORDER — METHOCARBAMOL 500 MG PO TABS
500.0000 mg | ORAL_TABLET | Freq: Four times a day (QID) | ORAL | Status: DC | PRN
  Administered 2018-02-19 – 2018-02-20 (×3): 500 mg via ORAL
  Filled 2018-02-19 (×3): qty 1

## 2018-02-19 MED ORDER — HYDROMORPHONE HCL 1 MG/ML IJ SOLN
0.2500 mg | INTRAMUSCULAR | Status: DC | PRN
  Administered 2018-02-21: 0.25 mg via INTRAVENOUS
  Filled 2018-02-19: qty 1

## 2018-02-19 MED ORDER — ALBUTEROL SULFATE (2.5 MG/3ML) 0.083% IN NEBU
2.5000 mg | INHALATION_SOLUTION | Freq: Four times a day (QID) | RESPIRATORY_TRACT | Status: DC | PRN
  Administered 2018-02-20: 2.5 mg via RESPIRATORY_TRACT
  Filled 2018-02-19 (×2): qty 3

## 2018-02-19 MED ORDER — BISACODYL 10 MG RE SUPP: 10.0000 mg | Freq: Every day | RECTAL | Status: DC | PRN

## 2018-02-19 MED ORDER — GLYCOPYRROLATE 0.2 MG/ML IJ SOLN
INTRAMUSCULAR | Status: DC | PRN
  Administered 2018-02-19: .1 mg via INTRAVENOUS

## 2018-02-19 MED ORDER — NAPHAZOLINE-GLYCERIN 0.012-0.2 % OP SOLN
1.0000 [drp] | Freq: Four times a day (QID) | OPHTHALMIC | Status: DC | PRN
  Filled 2018-02-19: qty 15

## 2018-02-19 MED ORDER — CEFAZOLIN SODIUM-DEXTROSE 1-4 GM/50ML-% IV SOLN
1.0000 g | Freq: Four times a day (QID) | INTRAVENOUS | Status: AC
  Administered 2018-02-19 (×2): 1 g via INTRAVENOUS
  Filled 2018-02-19 (×2): qty 50

## 2018-02-19 MED ORDER — LIDOCAINE 2% (20 MG/ML) 5 ML SYRINGE
INTRAMUSCULAR | Status: DC | PRN
  Administered 2018-02-19: 40 mg via INTRAVENOUS

## 2018-02-19 MED ORDER — LISINOPRIL-HYDROCHLOROTHIAZIDE 20-12.5 MG PO TABS: 1.0000 | ORAL_TABLET | Freq: Every day | ORAL | Status: DC

## 2018-02-19 MED ORDER — SIMVASTATIN 20 MG PO TABS
20.0000 mg | ORAL_TABLET | Freq: Every day | ORAL | Status: DC
  Administered 2018-02-19 – 2018-02-23 (×5): 20 mg via ORAL
  Filled 2018-02-19 (×5): qty 1

## 2018-02-19 MED ORDER — NITROGLYCERIN 0.4 MG SL SUBL: 0.4000 mg | SUBLINGUAL_TABLET | SUBLINGUAL | Status: DC | PRN

## 2018-02-19 MED ORDER — ASPIRIN EC 81 MG PO TBEC: 81.0000 mg | DELAYED_RELEASE_TABLET | Freq: Two times a day (BID) | ORAL | Status: DC

## 2018-02-19 MED ORDER — MAGNESIUM CITRATE PO SOLN
1.0000 | Freq: Once | ORAL | Status: AC | PRN
  Administered 2018-02-21: 1 via ORAL
  Filled 2018-02-19: qty 296

## 2018-02-19 MED ORDER — LACTATED RINGERS IV SOLN
INTRAVENOUS | Status: DC | PRN
  Administered 2018-02-19: 07:00:00 via INTRAVENOUS

## 2018-02-19 MED ORDER — ACETAMINOPHEN 10 MG/ML IV SOLN
INTRAVENOUS | Status: AC
  Filled 2018-02-19: qty 100

## 2018-02-19 MED ORDER — LISINOPRIL 20 MG PO TABS
20.0000 mg | ORAL_TABLET | Freq: Every day | ORAL | Status: DC
  Administered 2018-02-20 – 2018-02-23 (×4): 20 mg via ORAL
  Filled 2018-02-19 (×4): qty 1

## 2018-02-19 MED ORDER — ONDANSETRON HCL 4 MG/2ML IJ SOLN
INTRAMUSCULAR | Status: DC | PRN
  Administered 2018-02-19: 4 mg via INTRAVENOUS

## 2018-02-19 MED ORDER — KETOROLAC TROMETHAMINE 15 MG/ML IJ SOLN
7.5000 mg | Freq: Four times a day (QID) | INTRAMUSCULAR | Status: AC
  Administered 2018-02-19 – 2018-02-20 (×4): 7.5 mg via INTRAVENOUS
  Filled 2018-02-19 (×4): qty 1

## 2018-02-19 MED ORDER — SODIUM CHLORIDE 0.9 % IV SOLN
75.0000 mL/h | INTRAVENOUS | Status: DC
  Administered 2018-02-19: 75 mL/h via INTRAVENOUS

## 2018-02-19 MED ORDER — ALBUTEROL SULFATE HFA 108 (90 BASE) MCG/ACT IN AERS: 2.0000 | INHALATION_SPRAY | Freq: Four times a day (QID) | RESPIRATORY_TRACT | Status: DC | PRN

## 2018-02-19 MED ORDER — ACETAMINOPHEN 500 MG PO TABS
1000.0000 mg | ORAL_TABLET | Freq: Four times a day (QID) | ORAL | Status: AC
  Administered 2018-02-19 – 2018-02-20 (×4): 1000 mg via ORAL
  Filled 2018-02-19 (×4): qty 2

## 2018-02-19 MED ORDER — ASPIRIN 81 MG PO CHEW
81.0000 mg | CHEWABLE_TABLET | Freq: Two times a day (BID) | ORAL | Status: DC
  Administered 2018-02-19 – 2018-02-23 (×8): 81 mg via ORAL
  Filled 2018-02-19 (×8): qty 1

## 2018-02-19 MED ORDER — FENTANYL CITRATE (PF) 100 MCG/2ML IJ SOLN: 25.0000 ug | INTRAMUSCULAR | Status: DC | PRN

## 2018-02-19 MED ORDER — MAGNESIUM HYDROXIDE 400 MG/5ML PO SUSP: 30.0000 mL | Freq: Every day | ORAL | Status: DC | PRN

## 2018-02-19 MED ORDER — METOCLOPRAMIDE HCL 5 MG PO TABS: 5.0000 mg | ORAL_TABLET | Freq: Three times a day (TID) | ORAL | Status: DC | PRN

## 2018-02-19 MED ORDER — METOCLOPRAMIDE HCL 5 MG/ML IJ SOLN
5.0000 mg | Freq: Three times a day (TID) | INTRAMUSCULAR | Status: DC | PRN
  Administered 2018-02-21: 10 mg via INTRAVENOUS
  Filled 2018-02-19: qty 2

## 2018-02-19 MED ORDER — ALBUTEROL SULFATE HFA 108 (90 BASE) MCG/ACT IN AERS
INHALATION_SPRAY | RESPIRATORY_TRACT | Status: DC | PRN
  Administered 2018-02-19: 2 via RESPIRATORY_TRACT

## 2018-02-19 MED ORDER — ONDANSETRON HCL 4 MG PO TABS: 4.0000 mg | ORAL_TABLET | Freq: Four times a day (QID) | ORAL | Status: DC | PRN

## 2018-02-19 MED ORDER — PHENOL 1.4 % MT LIQD: 1.0000 | OROMUCOSAL | Status: DC | PRN

## 2018-02-19 MED ORDER — FENTANYL CITRATE (PF) 250 MCG/5ML IJ SOLN
INTRAMUSCULAR | Status: AC
  Filled 2018-02-19: qty 5

## 2018-02-19 MED ORDER — ALUM & MAG HYDROXIDE-SIMETH 200-200-20 MG/5ML PO SUSP
30.0000 mL | ORAL | Status: DC | PRN
  Administered 2018-02-20 – 2018-02-21 (×3): 30 mL via ORAL
  Filled 2018-02-19 (×3): qty 30

## 2018-02-19 MED ORDER — BUPIVACAINE-EPINEPHRINE (PF) 0.25% -1:200000 IJ SOLN
INTRAMUSCULAR | Status: AC
  Filled 2018-02-19: qty 30

## 2018-02-19 MED ORDER — FENTANYL CITRATE (PF) 250 MCG/5ML IJ SOLN
INTRAMUSCULAR | Status: DC | PRN
  Administered 2018-02-19 (×2): 25 ug via INTRAVENOUS

## 2018-02-19 MED ORDER — BUPIVACAINE-EPINEPHRINE (PF) 0.25% -1:200000 IJ SOLN
INTRAMUSCULAR | Status: DC | PRN
  Administered 2018-02-19: 30 mL

## 2018-02-19 MED ORDER — UMECLIDINIUM-VILANTEROL 62.5-25 MCG/INH IN AEPB
1.0000 | INHALATION_SPRAY | Freq: Every day | RESPIRATORY_TRACT | Status: DC
  Administered 2018-02-19 – 2018-02-21 (×3): 1 via RESPIRATORY_TRACT
  Filled 2018-02-19 (×2): qty 14

## 2018-02-19 MED ORDER — HYDROCHLOROTHIAZIDE 25 MG PO TABS: 25.0000 mg | ORAL_TABLET | Freq: Every day | ORAL | Status: DC

## 2018-02-19 MED ORDER — ONDANSETRON HCL 4 MG/2ML IJ SOLN
4.0000 mg | Freq: Four times a day (QID) | INTRAMUSCULAR | Status: DC | PRN
  Administered 2018-02-21 – 2018-02-22 (×3): 4 mg via INTRAVENOUS
  Filled 2018-02-19 (×3): qty 2

## 2018-02-19 MED ORDER — METHOCARBAMOL 1000 MG/10ML IJ SOLN
500.0000 mg | Freq: Four times a day (QID) | INTRAVENOUS | Status: DC | PRN
  Filled 2018-02-19: qty 5

## 2018-02-19 MED ORDER — DIPHENHYDRAMINE HCL 12.5 MG/5ML PO ELIX: 12.5000 mg | ORAL_SOLUTION | ORAL | Status: DC | PRN

## 2018-02-19 MED ORDER — BUPIVACAINE IN DEXTROSE 0.75-8.25 % IT SOLN
INTRATHECAL | Status: DC | PRN
  Administered 2018-02-19: 2 mL via INTRATHECAL

## 2018-02-19 MED ORDER — PROPOFOL 500 MG/50ML IV EMUL
INTRAVENOUS | Status: DC | PRN
  Administered 2018-02-19: 60 ug/kg/min via INTRAVENOUS

## 2018-02-19 MED ORDER — OXYCODONE HCL 5 MG PO TABS
5.0000 mg | ORAL_TABLET | ORAL | Status: DC | PRN
  Administered 2018-02-19: 10 mg via ORAL
  Administered 2018-02-19: 5 mg via ORAL
  Administered 2018-02-20 – 2018-02-22 (×6): 10 mg via ORAL
  Filled 2018-02-19 (×2): qty 2
  Filled 2018-02-19: qty 1
  Filled 2018-02-19 (×5): qty 2

## 2018-02-19 MED ORDER — SODIUM CHLORIDE 0.9 % IV SOLN
INTRAVENOUS | Status: DC | PRN
  Administered 2018-02-19: 30 ug/min via INTRAVENOUS

## 2018-02-19 MED ORDER — CEFAZOLIN SODIUM-DEXTROSE 2-4 GM/100ML-% IV SOLN
2.0000 g | INTRAVENOUS | Status: AC
  Administered 2018-02-19: 2 g via INTRAVENOUS

## 2018-02-19 MED ORDER — PHENYLEPHRINE 40 MCG/ML (10ML) SYRINGE FOR IV PUSH (FOR BLOOD PRESSURE SUPPORT)
PREFILLED_SYRINGE | INTRAVENOUS | Status: AC
  Filled 2018-02-19: qty 10

## 2018-02-19 MED ORDER — ISOSORBIDE MONONITRATE ER 60 MG PO TB24
60.0000 mg | ORAL_TABLET | Freq: Every day | ORAL | Status: DC
  Administered 2018-02-20 – 2018-02-23 (×4): 60 mg via ORAL
  Filled 2018-02-19 (×4): qty 1

## 2018-02-19 MED ORDER — MENTHOL 3 MG MT LOZG: 1.0000 | LOZENGE | OROMUCOSAL | Status: DC | PRN

## 2018-02-19 MED ORDER — SODIUM CHLORIDE 0.9 % IV SOLN: INTRAVENOUS | Status: DC

## 2018-02-19 SURGICAL SUPPLY — 61 items
ARTICULEZE HEAD (Hips) ×3 IMPLANT
BLADE SAW SAG 73X25 THK (BLADE) ×1
BLADE SAW SGTL 73X25 THK (BLADE) ×2 IMPLANT
COVER SURGICAL LIGHT HANDLE (MISCELLANEOUS) ×3 IMPLANT
DECANTER SPIKE VIAL GLASS SM (MISCELLANEOUS) ×3 IMPLANT
DRAPE INCISE IOBAN 66X45 STRL (DRAPES) ×2 IMPLANT
DRAPE ORTHO SPLIT 77X108 STRL (DRAPES) ×4
DRAPE SURG ORHT 6 SPLT 77X108 (DRAPES) ×2 IMPLANT
DRSG MEPILEX BORDER 4X12 (GAUZE/BANDAGES/DRESSINGS) ×3 IMPLANT
DURAPREP 26ML APPLICATOR (WOUND CARE) ×6 IMPLANT
ELECT BLADE 4.0 EZ CLEAN MEGAD (MISCELLANEOUS) ×3
ELECT REM PT RETURN 9FT ADLT (ELECTROSURGICAL) ×3
ELECTRODE BLDE 4.0 EZ CLN MEGD (MISCELLANEOUS) IMPLANT
ELECTRODE REM PT RTRN 9FT ADLT (ELECTROSURGICAL) ×1 IMPLANT
ELIMINATOR HOLE APEX DEPUY (Hips) ×2 IMPLANT
EVACUATOR 1/8 PVC DRAIN (DRAIN) IMPLANT
FACESHIELD WRAPAROUND (MASK) ×9 IMPLANT
FACESHIELD WRAPAROUND OR TEAM (MASK) ×3 IMPLANT
GLOVE BIOGEL PI IND STRL 8 (GLOVE) ×2 IMPLANT
GLOVE BIOGEL PI IND STRL 8.5 (GLOVE) ×1 IMPLANT
GLOVE BIOGEL PI INDICATOR 8 (GLOVE) ×4
GLOVE BIOGEL PI INDICATOR 8.5 (GLOVE) ×2
GLOVE ECLIPSE 8.0 STRL XLNG CF (GLOVE) ×6 IMPLANT
GLOVE ECLIPSE 8.5 STRL (GLOVE) ×6 IMPLANT
GOWN STRL REUS W/ TWL LRG LVL3 (GOWN DISPOSABLE) ×2 IMPLANT
GOWN STRL REUS W/TWL 2XL LVL3 (GOWN DISPOSABLE) ×6 IMPLANT
GOWN STRL REUS W/TWL LRG LVL3 (GOWN DISPOSABLE) ×4
HANDPIECE INTERPULSE COAX TIP (DISPOSABLE)
HEAD ARTICULEZE (Hips) IMPLANT
IMMOBILIZER KNEE 22 UNIV (SOFTGOODS) ×3 IMPLANT
KIT BASIN OR (CUSTOM PROCEDURE TRAY) ×3 IMPLANT
KIT TURNOVER KIT B (KITS) ×3 IMPLANT
LINER NEUTRAL 52X36X52 PLUS 4 (Liner) ×2 IMPLANT
MANIFOLD NEPTUNE II (INSTRUMENTS) ×3 IMPLANT
NEEDLE 22X1 1/2 (OR ONLY) (NEEDLE) ×1 IMPLANT
NS IRRIG 1000ML POUR BTL (IV SOLUTION) ×3 IMPLANT
PACK TOTAL JOINT (CUSTOM PROCEDURE TRAY) ×3 IMPLANT
PAD ARMBOARD 7.5X6 YLW CONV (MISCELLANEOUS) ×3 IMPLANT
PIN SECTOR W/GRIP ACE CUP 52MM (Hips) ×2 IMPLANT
SCREW 6.5MMX30MM (Screw) ×2 IMPLANT
SET HNDPC FAN SPRY TIP SCT (DISPOSABLE) IMPLANT
STAPLER VISISTAT 35W (STAPLE) IMPLANT
STEM FEM CMNTLSS LG AML 15.0 (Hips) ×2 IMPLANT
SUCTION FRAZIER HANDLE 10FR (MISCELLANEOUS) ×2
SUCTION TUBE FRAZIER 10FR DISP (MISCELLANEOUS) ×1 IMPLANT
SUT BONE WAX W31G (SUTURE) IMPLANT
SUT ETHIBOND NAB CT1 #1 30IN (SUTURE) ×9 IMPLANT
SUT MNCRL AB 3-0 PS2 18 (SUTURE) ×3 IMPLANT
SUT VIC AB 0 CT1 27 (SUTURE) ×4
SUT VIC AB 0 CT1 27XBRD ANBCTR (SUTURE) ×2 IMPLANT
SUT VIC AB 1 CT1 27 (SUTURE) ×4
SUT VIC AB 1 CT1 27XBRD ANBCTR (SUTURE) ×2 IMPLANT
SUT VIC AB 2-0 CT1 27 (SUTURE) ×2
SUT VIC AB 2-0 CT1 TAPERPNT 27 (SUTURE) ×1 IMPLANT
SYR CONTROL 10ML LL (SYRINGE) ×3 IMPLANT
TOWEL OR 17X24 6PK STRL BLUE (TOWEL DISPOSABLE) ×3 IMPLANT
TOWEL OR 17X26 10 PK STRL BLUE (TOWEL DISPOSABLE) ×3 IMPLANT
TRAY CATH 16FR W/PLASTIC CATH (SET/KITS/TRAYS/PACK) IMPLANT
TRAY URETHRAL FOLEY CATH 14FR (CATHETERS) ×2 IMPLANT
WATER STERILE IRR 1000ML POUR (IV SOLUTION) ×3 IMPLANT
WRAP KNEE MAXI GEL POST OP (GAUZE/BANDAGES/DRESSINGS) ×3 IMPLANT

## 2018-02-19 NOTE — H&P (Signed)
The recent History & Physical has been reviewed. I have personally examined the patient today. There is no interval change to the documented History & Physical. The patient would like to proceed with the procedure.  Valeria Batmaneter W Whitfield 02/19/2018,  7:05 AM

## 2018-02-19 NOTE — Progress Notes (Signed)
Pt arrived to room 5N24 after surgery. Received report from Loistine ChancePhilip, RN in PACU. See assessment. Will continue to monitor.

## 2018-02-19 NOTE — Plan of Care (Signed)
  Problem: Education: Goal: Knowledge of the prescribed therapeutic regimen will improve Outcome: Progressing   Problem: Pain Management: Goal: Pain level will decrease with appropriate interventions Outcome: Progressing   Problem: Skin Integrity: Goal: Will show signs of wound healing Outcome: Progressing   

## 2018-02-19 NOTE — Evaluation (Signed)
Physical Therapy Evaluation Patient Details Name: [name] MRN: [id number] DOB: 28-Feb-1946 Today's Date: 02/19/2018   History of Present Illness  [name] is a 72 y.o M s/p L THR Posterior Approach. PMH includes atherosclerosis, Peripheral Artery disease, Paresthesia of R foot, unstable angina, COPD, HTN, Chronic Respiratory Failure, HLD, CAD, Asthma.   Clinical Impression  Pt presents with problems above and deficits below.Pt is an unreliable reporter at this time, likely secondary to pain meds. Pt and daughters educated on precautions and supine HEP. Pt performed bed mobility and sit<>stand transfers with RW and ModA. Pt ambulated with RW and MinG. O2 sats WFL on 4 L of O2. Pt reports he expects to be d/c to SNF. Pt is at increased risk for falls and has decreased ability to perform ADLs. Will continue to follow acutely to support independence, mobility, and safety.     Follow Up Recommendations Follow surgeon's recommendation for DC plan and follow-up therapies;Supervision/Assistance - 24 hour    Equipment Recommendations  Rolling walker with 5" wheels;3in1 (PT)    Recommendations for Other Services OT consult     Precautions / Restrictions Precautions Precautions: Posterior Hip Precaution Booklet Issued: Yes (comment) Precaution Comments: Pt and daughters educated on precautions and supine HEP.  Required Braces or Orthoses: Knee Immobilizer - Left Knee Immobilizer - Left: Other (comment)(When in bed) Restrictions Weight Bearing Restrictions: Yes LLE Weight Bearing: Weight bearing as tolerated      Mobility  Bed Mobility Overal bed mobility: Needs Assistance Bed Mobility: Supine to Sit     Supine to sit: Mod assist     General bed mobility comments: Pt required ModA for trunk elevation, and use of bedpad to scoot hips to EOB. Pt was cued to follow precautions during transfer.   Transfers Overall transfer level: Needs assistance Equipment used: Rolling walker  (2 wheeled) Transfers: Sit to/from Stand Sit to Stand: Mod assist         General transfer comment: Pt required ModA and a RW for sit<>stand transfers. Pt had difficulty initiating and required ModA for power up and during full range of transfer. Pt had unsteadiness during transfer and required cues for safe hand placement with use of RW.   Ambulation/Gait Ambulation/Gait assistance: Min guard Gait Distance (Feet): 20 Feet Assistive device: Rolling walker (2 wheeled) Gait Pattern/deviations: Step-to pattern;Decreased stance time - left;Decreased step length - right;Decreased weight shift to left;Narrow base of support Gait velocity: Decreased   General Gait Details: Pt ambulated with MinG and a RW on 4L of O2; VSS WFL throughout mobility. Pt ambulated with a guarded step-to gait pattern and a narrow base of support that improved with cues. Pt had a flexed trunk during ambulation, even with cues to stand up tall. Pt had some SOB at the end of ambulation, which resolved with seated rest.   Stairs            Wheelchair Mobility    Modified Rankin (Stroke Patients Only)       Balance Overall balance assessment: Needs assistance Sitting-balance support: Bilateral upper extremity supported Sitting balance-Leahy Scale: Poor Sitting balance - Comments: Pt reliant on UE support when sitting at EOB   Standing balance support: Bilateral upper extremity supported Standing balance-Leahy Scale: Poor Standing balance comment: Pt reliant on UE support during static standing and during ambulation for balance.  Pertinent Vitals/Pain Pain Assessment: 0-10 Pain Score: 10-Worst pain ever Pain Location: L Hip Pain Descriptors / Indicators: Grimacing;Guarding;Moaning Pain Intervention(s): Monitored during session;Limited activity within patient's tolerance;Repositioned    Home Living Family/patient expects to be discharged to:: Skilled nursing  facility Living Arrangements: Children(Daughter) Available Help at Discharge: Available PRN/intermittently                  Prior Function Level of Independence: Independent with assistive device(s)         Comments: pt reports using a cane, and daughter confirmed. Pt is an unreliable reporter; pt reported using a RW and daughter refuted.      Hand Dominance   Dominant Hand: Right    Extremity/Trunk Assessment   Upper Extremity Assessment Upper Extremity Assessment: Defer to OT evaluation    Lower Extremity Assessment Lower Extremity Assessment: LLE deficits/detail;RLE deficits/detail RLE Deficits / Details: Pt reports baseline tingling in R LE.  RLE Sensation: history of peripheral neuropathy LLE Deficits / Details: Pain and deficits as expected s/p posterior THR. Pt reports normal sensation, and "itching" below the knee.  LLE Sensation: WNL    Cervical / Trunk Assessment Cervical / Trunk Assessment: Kyphotic  Communication   Communication: HOH  Cognition Arousal/Alertness: Awake/alert Behavior During Therapy: WFL for tasks assessed/performed Overall Cognitive Status: Impaired/Different from baseline Area of Impairment: Following commands;Safety/judgement;Problem solving;Memory                     Memory: Decreased recall of precautions;Decreased short-term memory Following Commands: Follows one step commands inconsistently;Follows one step commands with increased time Safety/Judgement: Decreased awareness of deficits   Problem Solving: Decreased initiation;Slow processing;Difficulty sequencing;Requires verbal cues;Requires tactile cues General Comments: Pt appears HOH. Pt is easily distracted, and requires repeated cues for sequencing, hand placement, and precautions. Pt had difficulty initiating. Pt is an unreliable and inconsistent reporter; daughters were present and disagreed with his statements. Daughters report "he's just more loopy," likely  secondary to pain medication. Pt has decreased short term memory, and difficulty recalling precautions or HEP despite re-education.       General Comments      Exercises Total Joint Exercises Ankle Circles/Pumps: AROM;Both;20 reps;Supine Quad Sets: AROM;Left;10 reps;Supine Heel Slides: AROM;Left;10 reps;Supine(Within precautions)   Assessment/Plan    PT Assessment Patient needs continued PT services  PT Problem List Decreased strength;Decreased range of motion;Decreased activity tolerance;Decreased balance;Decreased mobility;Decreased knowledge of use of DME;Decreased knowledge of precautions;Pain       PT Treatment Interventions DME instruction;Gait training;Stair training;Functional mobility training;Therapeutic exercise;Therapeutic activities;Balance training;Patient/family education    PT Goals (Current goals can be found in the Care Plan section)  Acute Rehab PT Goals Patient Stated Goal: "To go home and take care of his cows" (daughter reports he doesn't have cows) PT Goal Formulation: With patient Time For Goal Achievement: 03/05/18 Potential to Achieve Goals: Fair    Frequency 7X/week   Barriers to discharge        Co-evaluation               AM-PAC PT "6 Clicks" Daily Activity  Outcome Measure Difficulty turning over in bed (including adjusting bedclothes, sheets and blankets)?: Unable Difficulty moving from lying on back to sitting on the side of the bed? : Unable Difficulty sitting down on and standing up from a chair with arms (e.g., wheelchair, bedside commode, etc,.)?: Unable Help needed moving to and from a bed to chair (including a wheelchair)?: A Little Help needed walking in hospital room?: A Little  Help needed climbing 3-5 steps with a railing? : A Lot 6 Click Score: 11    End of Session Equipment Utilized During Treatment: Gait belt;Oxygen Activity Tolerance: Patient limited by pain Patient left: in chair;with call bell/phone within  reach;with chair alarm set;with family/visitor present Nurse Communication: Mobility status PT Visit Diagnosis: Unsteadiness on feet (R26.81);Other abnormalities of gait and mobility (R26.89);Muscle weakness (generalized) (M62.81);Difficulty in walking, not elsewhere classified (R26.2);Pain Pain - Right/Left: Left Pain - part of body: Hip    Time: [id number] PT Time Calculation (min) (ACUTE ONLY): 35 min   Charges:   PT Evaluation $PT Eval Low Complexity: 1 Low PT Treatments $Therapeutic Activity: 8-22 mins      Demetria PoreJulia Wes Lezotte, S-DPT Acute Care Rehab Student [phone]   t8/13/2019, 5:57 PM

## 2018-02-19 NOTE — Progress Notes (Signed)
Orthopedic Tech Progress Note Patient Details:  [name] [birth year] [id number]  Patient ID: [name], male   DOB: [birth year], 72 y.o.   MRN: [id number]   Nikki DomCrawford, Gatlin Kittell 02/19/2018, 1:08 PM Pt unable to use trapeze bar patient helper; RN notified

## 2018-02-19 NOTE — Anesthesia Postprocedure Evaluation (Signed)
Anesthesia Post Note  Patient: [name]  Procedure(s) Performed: LEFT TOTAL HIP ARTHROPLASTY (Left Hip)     Patient location during evaluation: PACU Anesthesia Type: Spinal Level of consciousness: oriented and awake and alert Pain management: pain level controlled Vital Signs Assessment: post-procedure vital signs reviewed and stable Respiratory status: spontaneous breathing, respiratory function stable, nonlabored ventilation and patient connected to nasal cannula oxygen Cardiovascular status: blood pressure returned to baseline and stable Postop Assessment: no headache, no backache and no apparent nausea or vomiting Anesthetic complications: no    Last Vitals:  Vitals:   02/19/18 1115 02/19/18 1137  BP:  133/73  Pulse: 72 75  Resp: 13 17  Temp:    SpO2: 100% 99%    Last Pain:  Vitals:   02/19/18 1238  TempSrc:   PainSc: 10-Worst pain ever                 Chanette Demo L Ethel Veronica

## 2018-02-19 NOTE — Progress Notes (Signed)
PATIENT ID:      Maretta LosBillie R Gerstner  MRN:     [id number] DOB/AGE:    Mar 20, 1946 / 72 y.o.       OPERATIVE REPORT    DATE OF PROCEDURE:  02/19/2018       PREOPERATIVE DIAGNOSIS: END STAGE OSTEOARTHRITIS LEFT HIP WITH AVASCULAR NECROSIS FEMORAL HEAD                                                       Estimated body mass index is 31.41 kg/m as calculated from the following:   Height as of 02/05/18: 5\' 4"  (1.626 m).   Weight as of 02/05/18: 83 kg.     POSTOPERATIVE DIAGNOSIS:   SAME                                                                   Estimated body mass index is 31.41 kg/m as calculated from the following:   Height as of 02/05/18: 5\' 4"  (1.626 m).   Weight as of 02/05/18: 83 kg.     PROCEDURE:  Procedure(s): LEFT TOTAL HIP ARTHROPLASTY      SURGEON:  Norlene CampbellPeter Whitfield, MD    ASSISTANT:   Jacqualine CodeBrian Petrarca, PA-C   (Present and scrubbed throughout the case, critical for assistance with exposure, retraction, instrumentation, and closure.)          ANESTHESIA: spinal and IV sedation     DRAINS: none :      TOURNIQUET TIME: * No tourniquets in log *    COMPLICATIONS:  None   CONDITION:  stable  PROCEDURE IN DETAIL: [id number]   Valeria Batmaneter W Whitfield 02/19/2018, 9:29 AM  Patient ID: Maretta LosBillie R Rosas, male   DOB: Mar 20, 1946, [age] y.o.   MRN: [id number]

## 2018-02-19 NOTE — Op Note (Signed)
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  PROCEDURE:  Left total hip replacement.  SURGEON:  Joni Fears, MD  ASSISTANT:  Biagio Borg, PA-C.  ANESTHESIA:  Spinal and IV sedation.  COMPLICATIONS:  None.  COMPONENTS:  DePuy AML 15 mm femoral component large stature, a 36 mm outer diameter hip ball with a +5 mm neck length, a 52 mm outer diameter Gription 3 metallic acetabular shell with an apex hole eliminator, a single 6.5 mm x 30 mm cancellous bone  screw in the acetabulum, Marathon polyethylene liner +4 with a 10 degree posterior lip.  Components were press-fit.  DESCRIPTION OF PROCEDURE:  The patient was met in the holding area with his daughter.  I identified the left hip was the appropriate operative site and marked it accordingly.  The patient was comfortable without any respiratory distress.  The patient was then transported to room 7.  Spinal anesthesia was administered by anesthesia without difficulty.  The patient was placed carefully on the operating table in the supine position.  Foley catheter was inserted.  Urine was clear.  The patient was then placed in the lateral decubitus position with the left side up and secured to the operating table with the Innomed hip system.  The left lower extremity was then prepped with chlorhexidine scrub and DuraPrep x2 from the iliac crest to the ankle.  Sterile draping was performed.  Timeout was called.  A routine southern incision was utilized via sharp dissection carried down through subcutaneous tissue.  Gross bleeders were Bovie coagulated.  Adipose  tissue was incised to the level of the IT band.  This was incised and the fibers were then bluntly  retracted.  With the patient's hip internally rotated, the short external rotators were identified and released from their posterior attachment to the greater trochanter.  The tendinous structures were tagged with 0 Ethibond suture.  The hip capsule was identified and incised with the Bovie along the length of the hip joint from the acetabular perimeter to the femoral neck.  There was some bloody effusion.  The head was then easily dislocated posteriorly.  There was considerable  avascular necrosis with delamination of the articular cartilage from the subchondral bone.  Using the calcar guide, the femoral neck was then osteotomized just below the femoral head.  Cut was about a fingerbreadth proximal to the lesser trochanter.  A starter hole was then made in the piriformis fossa.  Reaming was performed to 14.5 mm to accept a 15 mm component.  Rasping was performed sequentially to a 14.5 using the large stature 15 mm rasp.  At that point, we had a very nice fit with good purchase.  Retractors were then placed about the acetabulum.  I sharply excised the labrum.  Reaming was performed sequentially to 51 mm.  I trialed the 52 mm component.  It had excellent purchase that would completely ____ and tight around the periphery.  We then  elected to use the Gription 3, 52 mm outer diameter acetabular component.  This was carefully impacted in place with excellent stability.  The wound was irrigated with saline solution.  I elected to use a single 30 mm 6.5 cancellous screw after  appropriate reaming  and measuring.  The trial polyethylene component was then screwed in place followed by the large 15 mm femoral component.  We trialed several neck lengths and felt that the 5 mm neck and 36 mm outer diameter hip ball was the best fit.  The patient was slightly shorter  by about 1/4 to half an inch preoperatively  and we thought we had made up that difference.  After reduction of the above component construct, we had perfect stability.  The trial components were removed.  The apex hole eliminator was inserted.  We then inserted the final polyethylene component.  Wound was irrigated with saline solution.  The femoral component was then impacted, i.e. the 15 mm large stature flush on the calcar.  We then trialed with the 5 mm neck length with a 36 mm hip ball and felt that it was perfectly stable.  Morse taper neck and the femoral component was then cleaned, we irrigated the acetabulum and then applied the final metallic hip ball +5 with a 36 mm outer diameter.  This was reduced and through a full range of motion remained perfectly stable.  We  thought we had reestablished leg lengths.  The wound was again irrigated with saline solution.  I injected the deep capsule with 0.25% Marcaine with epinephrine then applied tranexamic acid under compression for about 5 minutes topically.  The hip capsule was closed with a running 0 Ethibond.   Short external rotators were closed with a similar material.  The IT band was closed with a running 0 Vicryl.  The subcutaneous closed with 2-0 Vicryl and 3-0 Monocryl.  Skin was closed with skin clips.  A sterile bulky dressing was applied.  The patient was then placed supine on the operating room stretcher and returned to the postanesthesia recovery room without complications.  TN/NUANCE  D:02/19/2018 T:02/19/2018 JOB:[id number]

## 2018-02-19 NOTE — Anesthesia Procedure Notes (Signed)
Spinal  Patient location during procedure: OR Start time: 02/19/2018 7:16 AM End time: 02/19/2018 7:26 AM Staffing Anesthesiologist: Elmer PickerWoodrum, Juwaun Inskeep L, MD Performed: anesthesiologist  Preanesthetic Checklist Completed: patient identified, surgical consent, pre-op evaluation, timeout performed, IV checked, risks and benefits discussed and monitors and equipment checked Spinal Block Patient position: sitting Prep: DuraPrep Patient monitoring: heart rate, continuous pulse ox and blood pressure Approach: midline Location: L3-4 Injection technique: single-shot Needle Needle type: Quincke  Needle gauge: 22 G Needle length: 9 cm Assessment Sensory level: T6 Additional Notes Discussed risks and benefits of and difference between general anesthesia and spinal anesthesia. Discussed risks of spinal including headache, backache, failure, bleeding, infection, and nerve damage. Patient consents to spinal. Questions answered. Coagulation studies and platelet count acceptable.

## 2018-02-19 NOTE — Transfer of Care (Signed)
Immediate Anesthesia Transfer of Care Note  Patient: [name]  Procedure(s) Performed: LEFT TOTAL HIP ARTHROPLASTY (Left Hip)  Patient Location: PACU  Anesthesia Type:MAC and Spinal  Level of Consciousness: drowsy but cooperative  Airway & Oxygen Therapy: Patient Spontanous Breathing and Patient connected to face mask oxygen  Post-op Assessment: Report given to RN and Post -op Vital signs reviewed and stable  Post vital signs: Reviewed and stable  Last Vitals:  Vitals Value Taken Time  BP 98/54 02/19/2018  9:55 AM  Temp    Pulse 86 02/19/2018  9:56 AM  Resp 16 02/19/2018  9:56 AM  SpO2 98 % 02/19/2018  9:56 AM  Vitals shown include unvalidated device data.  Last Pain:  Vitals:   02/19/18 0626  TempSrc:   PainSc: 0-No pain         Complications: No apparent anesthesia complications

## 2018-02-20 ENCOUNTER — Encounter (HOSPITAL_COMMUNITY): Payer: Self-pay | Admitting: Orthopaedic Surgery

## 2018-02-20 LAB — BASIC METABOLIC PANEL
Anion gap: 8 (ref 5–15)
BUN: 26 mg/dL — ABNORMAL HIGH (ref 8–23)
CALCIUM: 8.5 mg/dL — AB (ref 8.9–10.3)
CHLORIDE: 109 mmol/L (ref 98–111)
CO2: 26 mmol/L (ref 22–32)
CREATININE: 1.3 mg/dL — AB (ref 0.61–1.24)
GFR calc Af Amer: >60 mL/min (ref >60)
GFR calc non Af Amer: 53 mL/min — ABNORMAL LOW (ref >60)
Glucose, Bld: 157 mg/dL — ABNORMAL HIGH (ref 70–99)
Potassium: 3.9 mmol/L (ref 3.5–5.1)
Sodium: 143 mmol/L (ref 135–145)

## 2018-02-20 LAB — CBC
HCT: 27.9 % — ABNORMAL LOW (ref 39.0–52.0)
HEMOGLOBIN: 9 g/dL — AB (ref 13.0–17.0)
MCH: 29.5 pg (ref 26.0–34.0)
MCHC: 32.3 g/dL (ref 30.0–36.0)
MCV: 91.5 fL (ref 78.0–100.0)
Platelets: 199 10*3/uL (ref 150–400)
RBC: 3.05 MIL/uL — ABNORMAL LOW (ref 4.22–5.81)
RDW: 14.4 % (ref 11.5–15.5)
WBC: 16.1 10*3/uL — ABNORMAL HIGH (ref 4.0–10.5)

## 2018-02-20 MED ORDER — SODIUM CHLORIDE 0.9% FLUSH
3.0000 mL | Freq: Two times a day (BID) | INTRAVENOUS | Status: DC
  Administered 2018-02-20 – 2018-02-23 (×6): 3 mL via INTRAVENOUS

## 2018-02-20 MED ORDER — SODIUM CHLORIDE 0.9% FLUSH
3.0000 mL | INTRAVENOUS | Status: DC | PRN
Start: 2018-02-20 — End: 2018-02-23

## 2018-02-20 MED ORDER — SODIUM CHLORIDE 0.9 % IV SOLN: 250.0000 mL | INTRAVENOUS | Status: DC | PRN

## 2018-02-20 NOTE — Plan of Care (Signed)
Problem: Education: Goal: Understanding of discharge needs will improve Outcome: Progressing   Problem: Activity: Goal: Ability to avoid complications of mobility impairment will improve Outcome: Progressing Goal: Ability to tolerate increased activity will improve Outcome: Progressing   Problem: Clinical Measurements: Goal: Postoperative complications will be avoided or minimized Outcome: Progressing   Problem: Education: Goal: Knowledge of General Education information will improve Description Including pain rating scale, medication(s)/side effects and non-pharmacologic comfort measures Outcome: Progressing   Problem: Activity: Goal: Risk for activity intolerance will decrease Outcome: Progressing   Problem: Nutrition: Goal: Adequate nutrition will be maintained Outcome: Progressing   Problem: Coping: Goal: Level of anxiety will decrease Outcome: Progressing

## 2018-02-20 NOTE — Clinical Social Work Note (Signed)
Clinical Social Work Assessment  Patient Details  Name: [name] MRN: [id number] Date of Birth: Apr 03, 1946  Date of referral:  02/20/18               Reason for consult:  Facility Placement                Permission sought to share information with:  Facility Medical sales representativeContact Representative, Family Supports Permission granted to share information::  Yes, Verbal Permission Granted  Name::     [name]  Agency::  [facility]NF[facility]  Relationship::  daughter  Contact Information:  [phone]  Housing/Transportation Living arrangements for the past 2 months:  Single Family Home Source of Information:  Patient, Adult Children Patient Interpreter Needed:  None Criminal Activity/Legal Involvement Pertinent to Current Situation/Hospitalization:  No - Comment as needed Significant Relationships:  Adult Children Lives with:  Adult Children Do you feel safe going back to the place where you live?  No Need for family participation in patient care:  Yes (Comment)  Care giving concerns: CSW received consult for discharge needs. CSW spoke with patient and family(daughter-[name]) regarding PT recommendation of SNF placement at time of discharge. Patient states that one of his daughters and her children lives with him in the home.  Patient express although he prefers to go home he know it is better for to follow the recommendation to go to SNF for rehab.    Social Worker assessment / plan: CSW [facility] patient [facility].   Employment status:  Retired Health and safety inspectornsurance information:  Medicare PT Recommendations:  Skilled Nursing Facility Information / Referral to community resources:  Skilled Nursing Facility  Patient/Family's Response to care:  Patient recognizes need for rehab before returning home and is agreeable to a SNF placement. [facility] [place].  Patient/Family's Understanding of and Emotional Response to Diagnosis, Current  Treatment, and Prognosis: Patient/family is realistic regarding therapy needs and expressed being hopeful for SNF placement near their home. Patient and family expressed understanding of CSW role and discharge process as well as medical condition. No questions/concerns about plan or treatment.   Emotional Assessment Appearance:  Appears stated age Attitude/Demeanor/Rapport:  Engaged Affect (typically observed):  Appropriate, Accepting Orientation:  Oriented to Self, Oriented to Place, Oriented to  Time, Oriented to Situation Alcohol / Substance use:    Psych involvement (Current and /or in the community):  No (Comment)  Discharge Needs  Concerns to be addressed:  Care Coordination Readmission within the last 30 days:  No Current discharge risk:  None Barriers to Discharge:  Continued Medical Work up   Enterprise ProductsCynthia N Kalynne Womac, LCSWA 02/20/2018, 9:09 PM

## 2018-02-20 NOTE — Evaluation (Signed)
Occupational Therapy Evaluation Patient Details Name: [name] MRN: [id number] DOB: 11-02-1945 Today's Date: 02/20/2018    History of Present Illness [name] is a 72 y.o M s/p L THR Posterior Approach. PMH includes atherosclerosis, Peripheral Artery disease, Paresthesia of R foot, unstable angina, COPD, HTN, Chronic Respiratory Failure, HLD, CAD, Asthma.    Clinical Impression   PTA, pt reports independence with ADL and functional mobility. Per chart, he was utilizing a cane for ambulation. Pt confused during session with decreased safety awareness and short-term memory. He was able to state the day of the week but was not aware of the date and year. Pt able to complete ambulation to bathroom for simulated grooming tasks with min guard assist and cues for safety. Pt requiring total assist for LB ADL at this time and demonstrates minimal carryover of education concerning posterior hip precautions. Pt would benefit from continued OT services while admitted to improve independence and safety with ADL and functional mobility. Recommend SNF level rehabilitation post-acute D/C to maximize return to independence prior to discharge home. OT will continue to follow while admitted.     Follow Up Recommendations  Follow surgeon's recommendation for DC plan and follow-up therapies;SNF    Equipment Recommendations  Other (comment)(TBD at next venue of care)    Recommendations for Other Services       Precautions / Restrictions Precautions Precautions: Posterior Hip Precaution Booklet Issued: Yes (comment) Precaution Comments: Pt educated concerning precautions but demonstrates little recall. Required Braces or Orthoses: Knee Immobilizer - Left Knee Immobilizer - Left: Other (comment)(when in bed) Restrictions Weight Bearing Restrictions: Yes LLE Weight Bearing: Weight bearing as tolerated      Mobility Bed Mobility               General bed mobility comments: OOB in recliner  on my arrival.   Transfers Overall transfer level: Needs assistance Equipment used: Rolling walker (2 wheeled) Transfers: Sit to/from Stand Sit to Stand: Min guard         General transfer comment: Cues for safe LLE positioning to adhere to precautions.    Balance Overall balance assessment: Needs assistance Sitting-balance support: No upper extremity supported;Feet supported Sitting balance-Leahy Scale: Fair     Standing balance support: Bilateral upper extremity supported;No upper extremity supported;During functional activity Standing balance-Leahy Scale: Fair Standing balance comment: Able to statically stand without UE support.                            ADL either performed or assessed with clinical judgement   ADL Overall ADL's : Needs assistance/impaired Eating/Feeding: Set up;Sitting   Grooming: Min guard;Standing   Upper Body Bathing: Set up;Sitting   Lower Body Bathing: Total assistance;Sit to/from stand   Upper Body Dressing : Set up;Sitting   Lower Body Dressing: Total assistance;Sit to/from stand   Toilet Transfer: Ambulation;RW;Min Pension scheme manager Details (indicate cue type and reason): simulated with sit<>stand followed by functional mobility in room.  Toileting- Clothing Manipulation and Hygiene: Minimal assistance;Sit to/from stand       Functional mobility during ADLs: Min guard;Rolling walker General ADL Comments: Pt requiring cues to adhere to posterior hip precautions throughout tasks.      Vision Patient Visual Report: No change from baseline Vision Assessment?: No apparent visual deficits     Perception     Praxis      Pertinent Vitals/Pain Pain Assessment: Faces Faces Pain Scale: Hurts little more Pain Location:  L Hip Pain Descriptors / Indicators: Grimacing;Guarding;Operative site guarding Pain Intervention(s): Monitored during session;Limited activity within patient's tolerance;Repositioned     Hand  Dominance Right   Extremity/Trunk Assessment Upper Extremity Assessment Upper Extremity Assessment: Overall WFL for tasks assessed   Lower Extremity Assessment Lower Extremity Assessment: LLE deficits/detail LLE Deficits / Details: Decreased strength and ROM as expected post-operatively.        Communication Communication Communication: HOH   Cognition Arousal/Alertness: Awake/alert Behavior During Therapy: WFL for tasks assessed/performed Overall Cognitive Status: Impaired/Different from baseline Area of Impairment: Following commands;Safety/judgement;Problem solving;Memory;Attention;Orientation                 Orientation Level: Disoriented to;Time Current Attention Level: Selective Memory: Decreased recall of precautions;Decreased short-term memory Following Commands: Follows one step commands inconsistently;Follows one step commands with increased time Safety/Judgement: Decreased awareness of deficits   Problem Solving: Decreased initiation;Slow processing;Difficulty sequencing;Requires verbal cues;Requires tactile cues General Comments: Pt easily distracted and with poor understanding of safety and posterior hip precautions.    General Comments       Exercises     Shoulder Instructions      Home Living Family/patient expects to be discharged to:: Skilled nursing facility Living Arrangements: Children(daughter) Available Help at Discharge: Available PRN/intermittently                                    Prior Functioning/Environment Level of Independence: Independent with assistive device(s)        Comments: Per chart, pt was using cane. Pt reports independence with ADL but unsure of reliability of report.        OT Problem List: Decreased strength;Decreased range of motion;Decreased activity tolerance;Impaired balance (sitting and/or standing);Decreased cognition;Decreased safety awareness;Decreased knowledge of use of DME or AE;Decreased  knowledge of precautions;Pain      OT Treatment/Interventions: Self-care/ADL training;Therapeutic exercise;Energy conservation;DME and/or AE instruction;Therapeutic activities;Patient/family education;Balance training;Cognitive remediation/compensation    OT Goals(Current goals can be found in the care plan section) Acute Rehab OT Goals Patient Stated Goal: "to get back to golf" OT Goal Formulation: With patient Time For Goal Achievement: 03/06/18 Potential to Achieve Goals: Good ADL Goals Pt Will Perform Grooming: with supervision;standing Pt Will Perform Lower Body Bathing: with supervision;sit to/from stand;with adaptive equipment Pt Will Perform Lower Body Dressing: with supervision;sit to/from stand;with adaptive equipment Pt Will Transfer to Toilet: with supervision;ambulating;regular height toilet;bedside commode(BSC over toilet) Pt Will Perform Toileting - Clothing Manipulation and hygiene: with supervision;sit to/from stand  OT Frequency: Min 2X/week   Barriers to D/C:            Co-evaluation              AM-PAC PT "6 Clicks" Daily Activity     Outcome Measure Help from another person eating meals?: None Help from another person taking care of personal grooming?: A Little Help from another person toileting, which includes using toliet, bedpan, or urinal?: A Little Help from another person bathing (including washing, rinsing, drying)?: A Lot Help from another person to put on and taking off regular upper body clothing?: None Help from another person to put on and taking off regular lower body clothing?: A Lot 6 Click Score: 18   End of Session Equipment Utilized During Treatment: Gait belt;Rolling walker;Left knee immobilizer  Activity Tolerance: Patient tolerated treatment well Patient left: in chair;with call bell/phone within reach;with chair alarm set  OT Visit Diagnosis: Other abnormalities of  gait and mobility (R26.89);Pain;Other symptoms and signs  involving cognitive function Pain - Right/Left: Left Pain - part of body: Hip                Time: [id number] OT Time Calculation (min): 11 min Charges:  OT General Charges $OT Visit: 1 Visit OT Evaluation $OT Eval Moderate Complexity: 1 Mod  Doristine Sectionharity A Jonathan Corpus, MS OTR/L  Pager: [phone]   Grove Defina A Aydee Mcnew 02/20/2018, 1:26 PM

## 2018-02-20 NOTE — Progress Notes (Signed)
Physical Therapy Treatment Patient Details Name: [name] MRN: [id number] DOB: 1945/08/27 Today's Date: 02/20/2018    History of Present Illness [name] is a 72 y.o M s/p L THR Posterior Approach. PMH includes atherosclerosis, Peripheral Artery disease, Paresthesia of R foot, unstable angina, COPD, HTN, Chronic Respiratory Failure, HLD, CAD, Asthma.     PT Comments    Pt remains very limited secondary to fatigue and increased WOB with activity. Pt on 4L of O2 throughout with SPO2 decreasing to mid 80's with activity with slow recovery to low 90's with sitting rest breaks. Pt would continue to benefit from skilled physical therapy services at this time while admitted and after d/c to address the below listed limitations in order to improve overall safety and independence with functional mobility.    Follow Up Recommendations  SNF     Equipment Recommendations  Rolling walker with 5" wheels;3in1 (PT)    Recommendations for Other Services       Precautions / Restrictions Precautions Precautions: Posterior Hip Precaution Booklet Issued: Yes (comment) Precaution Comments: pt unable to recall any of his hip precautions, PT reviewed all precautions with pt throughout session Required Braces or Orthoses: Knee Immobilizer - Left Knee Immobilizer - Left: Other (comment)(when in bed) Restrictions Weight Bearing Restrictions: Yes LLE Weight Bearing: Weight bearing as tolerated    Mobility  Bed Mobility Overal bed mobility: Needs Assistance Bed Mobility: Supine to Sit;Sit to Supine     Supine to sit: Min guard Sit to supine: Min guard   General bed mobility comments: pt required increased time and effort, use of bed rail, min guard for safety  Transfers Overall transfer level: Needs assistance Equipment used: Rolling walker (2 wheeled) Transfers: Sit to/from Stand Sit to Stand: Min assist         General transfer comment: cueing for technique, min A to power into  standing from EOB  Ambulation/Gait Ambulation/Gait assistance: Min guard Gait Distance (Feet): 30 Feet Assistive device: Rolling walker (2 wheeled) Gait Pattern/deviations: Step-through pattern;Decreased step length - right;Decreased step length - left;Decreased stride length Gait velocity: Decreased Gait velocity interpretation: <1.31 ft/sec, indicative of household ambulator General Gait Details: pt very limited secondary to fatigue and increased WOB with activity   Stairs             Wheelchair Mobility    Modified Rankin (Stroke Patients Only)       Balance Overall balance assessment: Needs assistance Sitting-balance support: No upper extremity supported;Feet supported Sitting balance-Leahy Scale: Fair     Standing balance support: No upper extremity supported;During functional activity Standing balance-Leahy Scale: Fair Standing balance comment: pt able to stand x1 minute without UE support to use urinal with min guard for safety                            Cognition Arousal/Alertness: Awake/alert Behavior During Therapy: WFL for tasks assessed/performed Overall Cognitive Status: Impaired/Different from baseline Area of Impairment: Memory;Safety/judgement;Problem solving                     Memory: Decreased short-term memory;Decreased recall of precautions   Safety/Judgement: Decreased awareness of deficits;Decreased awareness of safety   Problem Solving: Decreased initiation;Slow processing;Difficulty sequencing;Requires verbal cues;Requires tactile cues        Exercises Total Joint Exercises Ankle Circles/Pumps: AROM;Both;20 reps;Seated Quad Sets: AROM;Left;10 reps;Supine Short Arc Quad: AROM;Strengthening;Left;10 reps;Supine Heel Slides: AAROM;Left;10 reps;Supine Hip ABduction/ADduction: AAROM;Left;10 reps;Seated Straight Leg Raises:  AAROM;Left;10 reps;Other (comment)(within precautions (did not go past 90 degrees of hip  flex))    General Comments        Pertinent Vitals/Pain Pain Assessment: No/denies pain    Home Living                      Prior Function            PT Goals (current goals can now be found in the care plan section) Acute Rehab PT Goals PT Goal Formulation: With patient Time For Goal Achievement: 03/05/18 Potential to Achieve Goals: Fair Progress towards PT goals: Progressing toward goals    Frequency    7X/week      PT Plan Current plan remains appropriate    Co-evaluation              AM-PAC PT "6 Clicks" Daily Activity  Outcome Measure  Difficulty turning over in bed (including adjusting bedclothes, sheets and blankets)?: A Lot Difficulty moving from lying on back to sitting on the side of the bed? : Unable Difficulty sitting down on and standing up from a chair with arms (e.g., wheelchair, bedside commode, etc,.)?: Unable Help needed moving to and from a bed to chair (including a wheelchair)?: None Help needed walking in hospital room?: A Little Help needed climbing 3-5 steps with a railing? : A Lot 6 Click Score: 13    End of Session Equipment Utilized During Treatment: Gait belt;Oxygen Activity Tolerance: Patient limited by fatigue;Patient limited by pain Patient left: in bed;with call bell/phone within reach;with bed alarm set Nurse Communication: Mobility status PT Visit Diagnosis: Other abnormalities of gait and mobility (R26.89);Pain Pain - Right/Left: Left Pain - part of body: Hip     Time: 1541-1600 PT Time Calculation (min) (ACUTE ONLY): 19 min  Charges:  $Gait Training: 8-22 mins                     [place]avanJennifer Alexyia Guarino, [place], [place] [phone]    Alessandra BevelsJennifer M Airen Dales 02/20/2018, 4:25 PM

## 2018-02-20 NOTE — Progress Notes (Signed)
Subjective: 1 Day Post-Op Procedure(s) (LRB): LEFT TOTAL HIP ARTHROPLASTY (Left) Patient reports pain as mild.    Objective: Vital signs in last 24 hours: Temp:  [97.2 F (36.2 C)-98 F (36.7 C)] 97.4 F (36.3 C) (08/14 0444) Pulse Rate:  [70-101] 85 (08/14 0444) Resp:  [12-24] 14 (08/14 0444) BP: (94-176)/(54-75) 144/72 (08/14 0444) SpO2:  [82 %-100 %] 94 % (08/14 0810) Weight:  [85.3 kg] 85.3 kg (08/13 1137)   RRR Lungs with scattered wheezes Abdomen soft with +BS  Intake/Output from previous day: 08/13 0701 - 08/14 0700 In: 2485.2 [P.O.:480; I.V.:1855.2; IV Piggyback:50] Out: 1750 [Urine:1600; Blood:150] Intake/Output this shift: No intake/output data recorded.  Recent Labs    02/19/18 0638 02/20/18 0508  HGB 11.1* 9.0*   Recent Labs    02/19/18 0638 02/20/18 0508  WBC 11.3* 16.1*  RBC 3.74* 3.05*  HCT 34.9* 27.9*  PLT 230 199   Recent Labs    02/19/18 0638 02/20/18 0508  NA 140 143  K 2.8* 3.9  CL 105 109  CO2 25 26  BUN 22 26*  CREATININE 1.23 1.30*  GLUCOSE 119* 157*  CALCIUM 8.9 8.5*   No results for input(s): LABPT, INR in the last 72 hours.  Sensation intact distally Dorsiflexion/Plantar flexion intact Incision: dressing C/D/I and no drainage Compartment soft  Anticipated LOS equal to or greater than 2 midnights due to - Age 265 and older with one or more of the following:  - Obesity  - Expected need for hospital services (PT, OT, Nursing) required for safe  discharge  - Anticipated need for postoperative skilled nursing care or inpatient rehab   OR   - Unanticipated findings during/Post Surgery: None  - Patient is a high risk of re-admission due to: None   Assessment/Plan: 1 Day Post-Op Procedure(s) (LRB): LEFT TOTAL HIP ARTHROPLASTY (Left) Up with therapy D/C IV fluids Discharge to SNF    [facility] Hospital[facility] Emilio [name] 02/20/2018, 8:14 AM

## 2018-02-20 NOTE — Progress Notes (Signed)
Physical Therapy Treatment Patient Details Name: [name] Alberto MRN: [id number] DOB: Jul 01, 1946 Today's Date: 02/20/2018    History of Present Illness Landry MellowBillie Lucci is a 72 y.o M s/p L THR Posterior Approach. PMH includes atherosclerosis, Peripheral Artery disease, Paresthesia of [name] foot, unstable angina, COPD, HTN, Chronic Respiratory Failure, HLD, CAD, Asthma.     PT Comments    Pt remains very limited with functional mobility secondary to fatigue and pain. He was able to increase his distance ambulated from previous session with min guard and RW. He was unable to recall any of his posterior hip precautions; PT reviewed all three with him throughout session. Pt would continue to benefit from skilled physical therapy services at this time while admitted and after d/c to address the below listed limitations in order to improve overall safety and independence with functional mobility.    Follow Up Recommendations  SNF     Equipment Recommendations  Rolling walker with 5" wheels;3in1 (PT)    Recommendations for Other Services       Precautions / Restrictions Precautions Precautions: Posterior Hip Precaution Booklet Issued: Yes (comment) Precaution Comments: pt unable to recall any of his hip precautions, PT reviewed all precautions with pt throughout session Required Braces or Orthoses: Knee Immobilizer - Left Knee Immobilizer - Left: Other (comment)(when in bed) Restrictions Weight Bearing Restrictions: Yes LLE Weight Bearing: Weight bearing as tolerated    Mobility  Bed Mobility               General bed mobility comments: pt OOB in recliner chair upon arrival  Transfers Overall transfer level: Needs assistance Equipment used: Rolling walker (2 wheeled) Transfers: Sit to/from Stand Sit to Stand: Min guard         General transfer comment: good technique, min guard for safety  Ambulation/Gait Ambulation/Gait assistance: Min guard Gait Distance (Feet): 40  Feet Assistive device: Rolling walker (2 wheeled) Gait Pattern/deviations: Step-through pattern;Decreased step length - right;Decreased step length - left;Decreased stride length Gait velocity: Decreased Gait velocity interpretation: <1.31 ft/sec, indicative of household ambulator General Gait Details: pt very limited secondary to fatigue and increased WOB with activity   Stairs             Wheelchair Mobility    Modified Rankin (Stroke Patients Only)       Balance Overall balance assessment: Needs assistance Sitting-balance support: No upper extremity supported;Feet supported Sitting balance-Leahy Scale: Fair     Standing balance support: Bilateral upper extremity supported;During functional activity Standing balance-Leahy Scale: Poor Standing balance comment: Able to statically stand without UE support.                             Cognition Arousal/Alertness: Awake/alert Behavior During Therapy: WFL for tasks assessed/performed Overall Cognitive Status: Impaired/Different from baseline Area of Impairment: Memory;Safety/judgement;Problem solving                 Orientation Level: Disoriented to;Time Current Attention Level: Selective Memory: Decreased short-term memory;Decreased recall of precautions Following Commands: Follows one step commands inconsistently;Follows one step commands with increased time Safety/Judgement: Decreased awareness of deficits;Decreased awareness of safety   Problem Solving: Decreased initiation;Slow processing;Difficulty sequencing;Requires verbal cues;Requires tactile cues General Comments: Pt easily distracted and with poor understanding of safety and posterior hip precautions.       Exercises Total Joint Exercises Ankle Circles/Pumps: AROM;Both;20 reps;Seated Hip ABduction/ADduction: AAROM;Left;10 reps;Seated Straight Leg Raises: AAROM;Left;10 reps;Other (comment)(within precautions (did not go past  90 degrees of  hip flex))    General Comments        Pertinent Vitals/Pain Pain Assessment: No/denies pain Faces Pain Scale: Hurts little more Pain Location: L Hip Pain Descriptors / Indicators: Grimacing;Guarding;Operative site guarding Pain Intervention(s): Monitored during session;Limited activity within patient's tolerance;Repositioned    Home Living Family/patient expects to be discharged to:: Skilled nursing facility Living Arrangements: Children(daughter) Available Help at Discharge: Available PRN/intermittently                Prior Function Level of Independence: Independent with assistive device(s)      Comments: Per chart, pt was using cane. Pt reports independence with ADL but unsure of reliability of report.   PT Goals (current goals can now be found in the care plan section) Acute Rehab PT Goals Patient Stated Goal: "to get back to golf" PT Goal Formulation: With patient Time For Goal Achievement: 03/05/18 Potential to Achieve Goals: Fair Progress towards PT goals: Progressing toward goals    Frequency    7X/week      PT Plan Current plan remains appropriate    Co-evaluation              AM-PAC PT "6 Clicks" Daily Activity  Outcome Measure  Difficulty turning over in bed (including adjusting bedclothes, sheets and blankets)?: Unable Difficulty moving from lying on back to sitting on the side of the bed? : Unable Difficulty sitting down on and standing up from a chair with arms (e.g., wheelchair, bedside commode, etc,.)?: Unable Help needed moving to and from a bed to chair (including a wheelchair)?: A Little Help needed walking in hospital room?: A Little Help needed climbing 3-5 steps with a railing? : A Lot 6 Click Score: 11    End of Session Equipment Utilized During Treatment: Gait belt;Oxygen Activity Tolerance: Patient limited by fatigue;Patient limited by pain Patient left: in chair;with call bell/phone within reach Nurse Communication:  Mobility status PT Visit Diagnosis: Other abnormalities of gait and mobility (R26.89);Pain Pain - Right/Left: Left Pain - part of body: Hip     Time: [id number] PT Time Calculation (min) (ACUTE ONLY): 13 min  Charges:  $Gait Training: 8-22 mins                     [place] City[place]ennifer Vesta [place], [place]PT, [place] [phone]    Alessandra BevelsJennifer M Alie Hardgrove 02/20/2018, 2:32 PM

## 2018-02-20 NOTE — [place] FL2 (Signed)
[place] MEDICAID FL2 LEVEL OF CARE SCREENING TOOL     IDENTIFICATION  Patient Name: [name] Birthdate: March 15, 1946 Sex: male Admission Date (Current Location): 02/19/2018  [facility] and [place]:  [place]   Facility and Address:  [facility], [address], [place], [place] [address]      Provider Number: [id number]  Attending Physician Name and Address:  Valeria BatmanWhitfield, Peter W, MD  Relative Name and Phone Number:  Janeal Holmesmy Jamison, daughter, [phone]    Current Level of Care: Hospital Recommended Level of Care: Skilled Nursing Facility Prior Approval Number:    Date Approved/Denied:   PASRR Number: [id number]  Discharge Plan: SNF    Current Diagnoses: Patient Active Problem List   Diagnosis Date Noted  . Primary osteoarthritis of left hip 02/19/2018  . Avascular necrosis of bone of hip, left (HCC) 02/19/2018  . S/P hip replacement, left 02/19/2018  . Unilateral primary osteoarthritis, left hip 01/09/2018  . Atherosclerosis of native arteries of the extremities with intermittent claudication 03/30/2014  . Aftercare following surgery of the circulatory system, NEC 03/30/2014  . PAD (peripheral artery disease) (HCC) 03/30/2014  . Muscle cramps- Right  Flank / Sharyne Peachrubk 03/30/2014  . Paresthesia of right foot 03/30/2014  . Chest pain 12/30/2013  . Unstable angina (HCC) 12/29/2013  . Chest pain at rest 12/29/2013  . Chronic respiratory failure (HCC) 12/29/2013  . COPD (chronic obstructive pulmonary disease) (HCC)   . HTN (hypertension), malignant 10/07/2013  . Dyspnea 03/06/2013  . Tobacco abuse   . HLD (hyperlipidemia)   . Peripheral vascular disease, unspecified (HCC) 03/26/2012  . Coronary atherosclerosis of native coronary artery 07/28/2011  . Mixed hyperlipidemia 07/28/2011  . Essential hypertension, benign 07/28/2011  . Peripheral arterial disease (HCC) 07/28/2011  . Carotid artery disease (HCC) 07/28/2011    Orientation  RESPIRATION BLADDER Height & Weight     Self, Time, Situation, Place  Normal Continent, Indwelling catheter Weight: 188 lb 0.8 oz (85.3 kg) Height:  5\' 6"  (167.6 cm)  BEHAVIORAL SYMPTOMS/MOOD NEUROLOGICAL BOWEL NUTRITION STATUS      Continent Diet(see discharge summary)  AMBULATORY STATUS COMMUNICATION OF NEEDS Skin   Extensive Assist Verbally Surgical wounds(incision on left hip)                       Personal Care Assistance Level of Assistance  Bathing, Feeding, Dressing Bathing Assistance: Maximum assistance Feeding assistance: Independent Dressing Assistance: Maximum assistance     Functional Limitations Info  Sight, Hearing, Speech Sight Info: Adequate Hearing Info: Adequate Speech Info: Adequate    SPECIAL CARE FACTORS FREQUENCY  PT (By licensed PT), OT (By licensed OT)     PT Frequency: 5x week OT Frequency: 5x week            Contractures Contractures Info: Not present    Additional Factors Info  Code Status, Allergies Code Status Info: Full Code Allergies Info: No Known Allergies           Current Medications (02/20/2018):  This is the current hospital active medication list Current Facility-Administered Medications  Medication Dose Route Frequency Provider Last Rate Last Dose  . 0.9 %  sodium chloride infusion  250 mL Intravenous PRN Petrarca, [name] D, PA-C      . albuterol (PROVENTIL) (2.5 MG/3ML) 0.083% nebulizer solution 2.5 mg  2.5 mg Nebulization Q6H PRN Petrarca, Oris DroneBrian D, PA-C      . alum & mag hydroxide-simeth (MAALOX/MYLANTA) 200-200-20 MG/5ML suspension 30 mL  30 mL Oral Q4H PRN Jetty Peeksetrarca, [name] D, PA-C      . aspirin chewable tablet 81 mg  81 mg Oral BID Jetty Peeksetrarca, [name] D, PA-C   81 mg at 02/20/18 1015  . bisacodyl (DULCOLAX) suppository 10 mg  10 mg Rectal Daily PRN Jetty Peeksetrarca, [name] D, PA-C      . diphenhydrAMINE (BENADRYL) 12.5 MG/5ML elixir 12.5-25 mg  12.5-25 mg Oral Q4H PRN Petrarca, [name] D, PA-C      . docusate sodium (COLACE)  capsule 100 mg  100 mg Oral BID Jacqualine Codeetrarca, [name] D, PA-C   100 mg at 02/20/18 1015  . lisinopril (PRINIVIL,ZESTRIL) tablet 20 mg  20 mg Oral Daily Baldemar FridayMasters, Alison M, RPH   20 mg at 02/20/18 1016   And  . hydrochlorothiazide (MICROZIDE) capsule 12.5 mg  12.5 mg Oral Daily Baldemar FridayMasters, Alison M, RPH   12.5 mg at 02/20/18 1015  . HYDROmorphone (DILAUDID) injection 0.25 mg  0.25 mg Intravenous Q4H PRN Jetty Peeksetrarca, [name] D, PA-C      . isosorbide mononitrate (IMDUR) 24 hr tablet 60 mg  60 mg Oral Daily Jetty Peeksetrarca, [name] D, PA-C   60 mg at 02/20/18 1015  . magnesium citrate solution 1 Bottle  1 Bottle Oral Once PRN Jacqualine CodePetrarca, [name] D, PA-C      . magnesium hydroxide (MILK OF MAGNESIA) suspension 30 mL  30 mL Oral Daily PRN Petrarca, Oris DroneBrian D, PA-C      . menthol-cetylpyridinium (CEPACOL) lozenge 3 mg  1 lozenge Oral PRN Jacqualine CodePetrarca, [name] D, PA-C       Or  . phenol (CHLORASEPTIC) mouth spray 1 spray  1 spray Mouth/Throat PRN Petrarca, Oris DroneBrian D, PA-C      . methocarbamol (ROBAXIN) tablet 500 mg  500 mg Oral Q6H PRN Jetty Peeksetrarca, [name] D, PA-C   500 mg at 02/19/18 2006   Or  . methocarbamol (ROBAXIN) 500 mg in dextrose 5 % 50 mL IVPB  500 mg Intravenous Q6H PRN Petrarca, [name] D, PA-C      . metoCLOPramide (REGLAN) tablet 5-10 mg  5-10 mg Oral Q8H PRN Jacqualine CodePetrarca, [name] D, PA-C       Or  . metoCLOPramide (REGLAN) injection 5-10 mg  5-10 mg Intravenous Q8H PRN Petrarca, Oris DroneBrian D, PA-C      . naphazoline-glycerin (CLEAR EYES REDNESS) ophth solution 1 drop  1 drop Both Eyes QID PRN Petrarca, Oris DroneBrian D, PA-C      . nitroGLYCERIN (NITROSTAT) SL tablet 0.4 mg  0.4 mg Sublingual Q5 Min x 3 PRN Petrarca, [name] D, PA-C      . ondansetron (ZOFRAN) tablet 4 mg  4 mg Oral Q6H PRN Jacqualine CodePetrarca, [name] D, PA-C       Or  . ondansetron (ZOFRAN) injection 4 mg  4 mg Intravenous Q6H PRN Jacqualine CodePetrarca, [name] D, PA-C      . oxyCODONE (Oxy IR/ROXICODONE) immediate release tablet 5-10 mg  5-10 mg Oral Q4H PRN Jacqualine CodePetrarca, [name] D, PA-C   5 mg at 02/19/18 2006   . simvastatin (ZOCOR) tablet 20 mg  20 mg Oral Daily Jetty Peeksetrarca, [name] D, PA-C   20 mg at 02/20/18 1015  . sodium chloride flush (NS) 0.9 % injection 3 mL  3 mL Intravenous Q12H Jacqualine Codeetrarca, [name] D, PA-C   3 mL at 02/20/18 1019  . sodium chloride flush (NS) 0.9 % injection 3 mL  3 mL Intravenous PRN Petrarca, [name] D, PA-C      . umeclidinium-vilanterol (ANORO ELLIPTA) 62.5-25 MCG/INH 1 puff  1 puff Inhalation Daily  Jetty Peeks, PA-C   1 puff at 02/20/18 0981     Discharge Medications: Please see discharge summary for a list of discharge medications.  Relevant Imaging Results:  Relevant Lab Results:   Additional Information SS# [id number] [place], [place]

## 2018-02-21 ENCOUNTER — Inpatient Hospital Stay (HOSPITAL_COMMUNITY): Payer: Medicare Other

## 2018-02-21 ENCOUNTER — Encounter (HOSPITAL_COMMUNITY): Payer: Self-pay | Admitting: *Deleted

## 2018-02-21 DIAGNOSIS — M1612 Unilateral primary osteoarthritis, left hip: Principal | ICD-10-CM

## 2018-02-21 LAB — BASIC METABOLIC PANEL
Anion gap: 7 (ref 5–15)
BUN: 20 mg/dL (ref 8–23)
CALCIUM: 8.4 mg/dL — AB (ref 8.9–10.3)
CO2: 28 mmol/L (ref 22–32)
CREATININE: 0.97 mg/dL (ref 0.61–1.24)
Chloride: 106 mmol/L (ref 98–111)
GFR calc Af Amer: >60 mL/min (ref >60)
GLUCOSE: 117 mg/dL — AB (ref 70–99)
Potassium: 3.1 mmol/L — ABNORMAL LOW (ref 3.5–5.1)
Sodium: 141 mmol/L (ref 135–145)

## 2018-02-21 LAB — CBC
HCT: 27.9 % — ABNORMAL LOW (ref 39.0–52.0)
Hemoglobin: 8.9 g/dL — ABNORMAL LOW (ref 13.0–17.0)
MCH: 29.2 pg (ref 26.0–34.0)
MCHC: 31.9 g/dL (ref 30.0–36.0)
MCV: 91.5 fL (ref 78.0–100.0)
PLATELETS: 241 10*3/uL (ref 150–400)
RBC: 3.05 MIL/uL — ABNORMAL LOW (ref 4.22–5.81)
RDW: 14.8 % (ref 11.5–15.5)
WBC: 13.7 10*3/uL — AB (ref 4.0–10.5)

## 2018-02-21 LAB — PROCALCITONIN: Procalcitonin: 0.13 ng/mL

## 2018-02-21 MED ORDER — POTASSIUM CHLORIDE 10 MEQ/100ML IV SOLN
10.0000 meq | INTRAVENOUS | Status: AC
  Administered 2018-02-21 (×6): 10 meq via INTRAVENOUS
  Filled 2018-02-21 (×6): qty 100

## 2018-02-21 MED ORDER — ASPIRIN 81 MG PO CHEW: 81.0000 mg | CHEWABLE_TABLET | Freq: Two times a day (BID) | ORAL | Status: DC

## 2018-02-21 MED ORDER — OXYCODONE HCL 5 MG PO TABS: 5.0000 mg | ORAL_TABLET | ORAL | 0 refills | Status: DC | PRN

## 2018-02-21 MED ORDER — IPRATROPIUM-ALBUTEROL 0.5-2.5 (3) MG/3ML IN SOLN: 3.0000 mL | Freq: Four times a day (QID) | RESPIRATORY_TRACT | Status: DC | PRN

## 2018-02-21 MED ORDER — AZITHROMYCIN 250 MG PO TABS
250.0000 mg | ORAL_TABLET | Freq: Every day | ORAL | Status: DC
  Administered 2018-02-21 – 2018-02-23 (×3): 250 mg via ORAL
  Filled 2018-02-21 (×3): qty 1

## 2018-02-21 MED ORDER — AZITHROMYCIN 250 MG PO TABS: ORAL_TABLET | ORAL | 0 refills | Status: DC

## 2018-02-21 MED ORDER — ALBUTEROL SULFATE (2.5 MG/3ML) 0.083% IN NEBU
2.5000 mg | INHALATION_SOLUTION | Freq: Once | RESPIRATORY_TRACT | Status: AC | PRN
  Administered 2018-02-21: 2.5 mg via RESPIRATORY_TRACT

## 2018-02-21 MED ORDER — FLEET ENEMA 7-19 GM/118ML RE ENEM
1.0000 | ENEMA | Freq: Once | RECTAL | Status: DC
  Filled 2018-02-21: qty 1

## 2018-02-21 MED ORDER — POLYETHYLENE GLYCOL 3350 17 G PO PACK
17.0000 g | PACK | Freq: Every day | ORAL | Status: DC
  Administered 2018-02-22 – 2018-02-23 (×2): 17 g via ORAL
  Filled 2018-02-21 (×3): qty 1

## 2018-02-21 MED ORDER — ALBUTEROL SULFATE (2.5 MG/3ML) 0.083% IN NEBU
2.5000 mg | INHALATION_SOLUTION | RESPIRATORY_TRACT | Status: DC | PRN
  Administered 2018-02-21: 2.5 mg via RESPIRATORY_TRACT
  Filled 2018-02-21: qty 3

## 2018-02-21 NOTE — Consult Note (Addendum)
Medical Consultation   Tom LosBillie R [name]  ZOX:[id number]  DOB: Dec 27, 1945  DOA: 02/19/2018  PCP: [name], Ashish, MD    Requesting physician: Jacqualine CodeBrian Petrarca, PA-C, Orthopedics  Reason for consultation: To help in the management of COPD.    History of Present Illness: [name] is an 72 y.o. male with a history of osteoarthritis, hypertension, hyperlipidemia, peripheral vascular disease, carotid artery disease, CAD status post STEMI in 2012, tobacco abuse, COPD-asthma on 4 L at home, admitted on 02/19/2018 for left hip replacement after failing to conservative measures at home.  He was recuperating fairly well from the orthopedic procedure, but around 2 AM, the patient became dyspneic, suddenly dropping his saturations between 88 and 92%, accompanying by dry heaving and nausea.  He was placed on NRB, as he was using some abdominal accessory muscle use, eventually, and after receiving albuterol, and Zofran, the patient was re-placed on his 4 L of nasal cannula and more stable since then.  Pulse ox has been in the 98 at 4 L, with normal respirations. Outpatient, he is being followed by Dr. Vassie LollAlva, pulmonology, and has had a clearance prior to this operation.  He reported that lung function is at 66%.  At the time, he was recommended to continue an oral once daily, and albuterol as needed.  He denies any fever, chills or night sweats.  The patient is a poor historian, and his answers are limited.  He denies any chest pain or palpitations, he denies any known recent infections.   Of note, today, his status at the hospital was complicated with ileus, for which general surgery is to consult.  The patient denies having any abdominal pain, but does report that his last bowel movement was 4 days ago, and "he cannot go ". He reports having had colon resection in 2008 for unresectable polyp. He denies any dysuria, gross hematuria.  He does have decreased oral intake, and has had decreased urinary  output according to him.  He denies any worsening lower extremity swelling, or calf pain.  He denies any history of PE or DVT.  He is not on blood thinners.  He continues to smoke.  He denies any recreational drug use, or changes in his medications recently.  He does admit to having taken significant amount of NSAIDs and pain killers due to the arthritis, which may have exacerbated his decreased bowel functions.   Review of Systems:  As per HPI otherwise all other systems reviewed and are negative    Past Medical History: Past Medical History:  Diagnosis Date  . Asthma   . Carotid artery disease (HCC)    50-69% RICA and less than 50% LICA, 07/2011  . COPD (chronic obstructive pulmonary disease) (HCC)   . Coronary atherosclerosis of native coronary artery    Based on abnormal Myoview - inferior scar, 05/2011; LVEF 45-50%, echo, 07/2011  . Essential hypertension, benign   . HLD (hyperlipidemia)   . NSTEMI (non-ST elevated myocardial infarction) (HCC)    12/12, minor  . On home oxygen therapy    4Lnc with exertion  . Peripheral arterial disease (HCC)   . Tobacco abuse     Past Surgical History: Past Surgical History:  Procedure Laterality Date  . ABDOMINAL SURGERY     2008  . COLONOSCOPY    . Right axillary to femoral bypass  3/12   Dr. Hart RochesterLawson  . RIGHT HYDROCELE REPAIR    .  Right to left femoral to femoral bypass  3/12   Dr. Hart RochesterLawson  . SIGMOID RESECTION / RECTOPEXY  2005  . skin abscess drainage    . TOTAL HIP ARTHROPLASTY Left 02/19/2018   Procedure: LEFT TOTAL HIP ARTHROPLASTY;  Surgeon: Valeria BatmanWhitfield, Peter W, MD;  Location: [facility] OR;  Service: Orthopedics;  Laterality: Left;     Allergies:  No Known Allergies   Social History: Social History   Socioeconomic History  . Marital status: Divorced    Spouse name: Not on file  . Number of children: Not on file  . Years of education: Not on file  . Highest education level: Not on file  Occupational History  . Not on file    Social Needs  . Financial resource strain: Not on file  . Food insecurity:    Worry: Not on file    Inability: Not on file  . Transportation needs:    Medical: Not on file    Non-medical: Not on file  Tobacco Use  . Smoking status: Former Smoker    Packs/day: 2.00    Years: 55.00    Pack years: 110.00    Types: Cigarettes    Start date: 12/03/1959    Last attempt to quit: 12/09/2010    Years since quitting: 7.2  . Smokeless tobacco: Never Used  . Tobacco comment: Patient has not somked since midnight on wednesday.   Substance and Sexual Activity  . Alcohol use: No    Alcohol/week: 0.0 standard drinks  . Drug use: No  . Sexual activity: Not on file  Lifestyle  . Physical activity:    Days per week: Not on file    Minutes per session: Not on file  . Stress: Not on file  Relationships  . Social connections:    Talks on phone: Not on file    Gets together: Not on file    Attends religious service: Not on file    Active member of club or organization: Not on file    Attends meetings of clubs or organizations: Not on file    Relationship status: Not on file  . Intimate partner violence:    Fear of current or ex partner: Not on file    Emotionally abused: Not on file    Physically abused: Not on file    Forced sexual activity: Not on file  Other Topics Concern  . Not on file  Social History Narrative  . Not on file       Family History: Family History  Problem Relation Age of Onset  . Heart attack Father   . Heart attack Mother     Family history reviewed and not pertinent    Physical Exam: Vitals:   02/20/18 1945 02/21/18 0240 02/21/18 0301 02/21/18 0451  BP: 134/64 (!) 141/73  139/65  Pulse: 93 98 100 (!) 107  Resp: 17 20 18 19   Temp: 97.7 F (36.5 C)   97.9 F (36.6 C)  TempSrc: Oral   Oral  SpO2: 99%  96% 98%  Weight:      Height:        Constitutional: Appears calm,  alert and awake, very ill appearing  eyes: PERLA, EOMI, irises appear normal,  anicteric sclera,  ENMT: external ears and nose appear normal, normal hearing  Lips appears normal, oropharynx mucosa, tonguenormal  Neck: neck appears normal, no masses, normal ROM, no thyromegaly, no JVD  CVS: S1-S2 clear, no murmur rubs or gallops, 2+ lateral LE edema, normal  pedal pulses  Respiratory: Remarkable for diffuse rhonchi, bibasilar crackles, at this time no wheezing is noted.  He does cough when he tries to take a deep breath, but he reports this is chronic.  No accessory muscle use is noted at this time. Abdomen: Distended, bowel sounds are decreased in the upper abdomen, in the lower abdomen there present.  Some of the bowel sounds are high-pitched. no hepatosplenomegaly, no hernias.  He denies any suprapubic tenderness.  Umbilical hernia noted Musculoskeletal: no cyanosis, clubbing, abnormal strength, contractures or atrophy Neuro: Cranial nerves II-XII intact, strength, sensation, reflexes Psych: judgement and insight appear normal, stable mood and flat affect Skin: no rashes or lesions or ulcers, no induration or nodules   Data reviewed:  I have personally reviewed following labs and imaging studies Labs:  CBC: Recent Labs  Lab 02/19/18 0638 02/20/18 0508 02/21/18 0330  WBC 11.3* 16.1* 13.7*  HGB 11.1* 9.0* 8.9*  HCT 34.9* 27.9* 27.9*  MCV 93.3 91.5 91.5  PLT 230 199 241    Basic Metabolic Panel: Recent Labs  Lab 02/19/18 0638 02/20/18 0508 02/21/18 0330  NA 140 143 141  K 2.8* 3.9 3.1*  CL 105 109 106  CO2 25 26 28   GLUCOSE 119* 157* 117*  BUN 22 26* 20  CREATININE 1.23 1.30* 0.97  CALCIUM 8.9 8.5* 8.4*   GFR Estimated Creatinine Clearance: 69.5 mL/min (by C-G formula based on SCr of 0.97 mg/dL). Liver Function Tests: No results for input(s): AST, ALT, ALKPHOS, BILITOT, PROT, ALBUMIN in the last 168 hours. No results for input(s): LIPASE, AMYLASE in the last 168 hours. No results for input(s): AMMONIA in the last 168 hours. Coagulation profile No  results for input(s): INR, PROTIME in the last 168 hours.  Cardiac Enzymes: No results for input(s): CKTOTAL, CKMB, CKMBINDEX, TROPONINI in the last 168 hours. BNP: Invalid input(s): POCBNP CBG: No results for input(s): GLUCAP in the last 168 hours. D-Dimer No results for input(s): DDIMER in the last 72 hours. Hgb A1c No results for input(s): HGBA1C in the last 72 hours. Lipid Profile No results for input(s): CHOL, HDL, LDLCALC, TRIG, CHOLHDL, LDLDIRECT in the last 72 hours. Thyroid function studies No results for input(s): TSH, T4TOTAL, T3FREE, THYROIDAB in the last 72 hours.  Invalid input(s): FREET3 Anemia work up No results for input(s): VITAMINB12, FOLATE, FERRITIN, TIBC, IRON, RETICCTPCT in the last 72 hours. Urinalysis    Component Value Date/Time   COLORURINE YELLOW 02/01/2018 1016   APPEARANCEUR HAZY (A) 02/01/2018 1016   LABSPEC 1.020 02/01/2018 1016   PHURINE 5.0 02/01/2018 1016   GLUCOSEU NEGATIVE 02/01/2018 1016   HGBUR NEGATIVE 02/01/2018 1016   BILIRUBINUR NEGATIVE 02/01/2018 1016   KETONESUR NEGATIVE 02/01/2018 1016   PROTEINUR NEGATIVE 02/01/2018 1016   UROBILINOGEN 0.2 09/14/2010 0706   NITRITE NEGATIVE 02/01/2018 1016   LEUKOCYTESUR NEGATIVE 02/01/2018 1016     Sepsis Labs Invalid input(s): PROCALCITONIN,  WBC,  LACTICIDVEN Microbiology No results found for this or any previous visit (from the past 240 hour(s)).     Inpatient Medications:   Scheduled Meds: . aspirin  81 mg Oral BID  . docusate sodium  100 mg Oral BID  . lisinopril  20 mg Oral Daily   And  . hydrochlorothiazide  12.5 mg Oral Daily  . isosorbide mononitrate  60 mg Oral Daily  . simvastatin  20 mg Oral Daily  . sodium chloride flush  3 mL Intravenous Q12H  . umeclidinium-vilanterol  1 puff Inhalation Daily   Continuous  Infusions: . sodium chloride    . methocarbamol (ROBAXIN) IV       Radiological Exams on Admission: Dg Abd 1 View  Result Date: 02/21/2018 CLINICAL  DATA:  72 year old male with a history of postoperative ileus EXAM: ABDOMEN - 1 VIEW COMPARISON:  12/14/2017, CT 03/10/2017 FINDINGS: Gas within stomach, small bowel, colon, with mild colonic distention in the mid abdomen. No significant stool burden. Gas extends to the rectum. Calcifications of the abdominal aorta and iliac arteries. Surgical changes of left hip arthroplasty. 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 IMPRESSION: Nonobstructive bowel gas pattern with mild distention of the colon, potentially ileus. Atherosclerosis. Left hip arthroplasty, with redemonstration of changes of right hip AVN Electronically Signed   By: Gilmer Mor D.O.   On: 02/21/2018 10:13   Dg Chest Port 1 View  Result Date: 02/21/2018 CLINICAL DATA:  Acute respiratory distress EXAM: PORTABLE CHEST 1 VIEW COMPARISON:  01/07/2018 FINDINGS: Cardiac shadow is within normal limits. Aortic calcifications are noted. The lungs are well aerated bilaterally with mild scarring stable from the prior exam. No acute bony abnormality is seen. IMPRESSION: No acute abnormality noted. Electronically Signed   By: Alcide Clever M.D.   On: 02/21/2018 02:59   Dg Hip Port Unilat With Pelvis 1v Left  Result Date: 02/19/2018 CLINICAL DATA:  Left total hip replacement EXAM: DG HIP (WITH OR WITHOUT PELVIS) 1V PORT LEFT COMPARISON:  None. FINDINGS: Changes of left total hip replacement. No hardware or bony complicating feature. Normal alignment. Overlying soft tissue gas. Advanced degenerative changes in the right hip with femoral head flattening, possibly related to AVN. IMPRESSION: Changes of left hip replacement. No hardware or bony complicating feature. Advanced degenerative changes and femoral head flattening in the right hip joint. Electronically Signed   By: Charlett Nose M.D.   On: 02/19/2018 11:00    Impression/Recommendations Principal Problem:   Primary osteoarthritis of left hip Active Problems:   Tobacco abuse   COPD (chronic  obstructive pulmonary disease) (HCC)   Chronic respiratory failure (HCC)   Avascular necrosis of bone of hip, left (HCC)   S/P hip replacement, left     Acute on chronic hypoxic respiratory failure likely secondary to acute COPD exacerbation. [id number] 0, Class 2    No steroids are to be given due to recent surgery.  CXR neg for acute findings.  WBC at 13.7, likely exacerbated by inflammatory process due to recent surgery, ileus.  Currently, his O2 sats are normal at 4 L of oxygen nasal cannula.  Bicarb is 28 Recommend continue with albuterol every 4 hours as needed, adding DuoNeb every 6 hours as needed, unless tachycardic above 114, at which time Xopenex is recommended. Mucinex twice daily as needed Consider low dose oral azithromycin for antibiotic coverage Contiune Anoro Ellipta   Mucinex bid prn  O2 prn CBC in am Incentive spirometry Continue respiratory therapy Follow with Dr. Vassie Loll soon after discharge Tobacco cessation   Presumed Ileus, per abdominal x-ray, new onset, no bowel movement for 4 days. Has not been mobilizing much since surgery  Gen surgery to see   Other medical issues, as per admitting team.    Thank you for this consultation.  Our [facility] hospitalist team will follow the patient with you.   Time Spent:   Marlowe Kays PA-C [facility] 02/21/2018, 10:47 AM

## 2018-02-21 NOTE — Consult Note (Signed)
[name] August 16, 1945  [id number].    Requesting MD: Dr. Joni Fears Chief Complaint/Reason for Consult: Postoperative ileus  HPI:  This is a 72 year old white male with a history of peripheral artery disease, hypertension, coronary artery disease, and COPD on home oxygen who underwent total hip arthroplasty on Monday.  The patient was initially doing well with no nausea and able to eat a solid diet.  Apparently yesterday he began having nausea with dry heaving.  He does state that he did have a very small amount of emesis once, maybe twice but nothing significant.  He is continued to have dry heaving and nausea into today.  He states he does not think it is the pain medicine as he has had pain medicine ever since his operation.  He is not passing much flatus but did pass a small amount today.  He has not had a bowel movement since being admitted.  He has a history of prior partial colectomy and has 2 large incisional hernias.  He denies any abdominal pain but does feel bloated.  He had a plain film today that revealed a nonobstructive bowel gas pattern with some distention of his colon but he does have stool and gas present in his colon.  We have been asked to evaluate the patient for a postoperative ileus.  ROS: ROS: Please see HPI, otherwise all other systems have been reviewed and are negative.  He does admit to chronic shortness of breath requiring O2 dependence.  He also admits to pain in his left leg from his recent surgery.  Family History  Problem Relation Age of Onset  . Heart attack Father   . Heart attack Mother     Past Medical History:  Diagnosis Date  . Asthma   . Carotid artery disease (HCC)    50-69% RICA and less than 70% LICA, 07/7791  . COPD (chronic obstructive pulmonary disease) ([place])   . Coronary atherosclerosis of native coronary artery    Based on abnormal Myoview - inferior scar, 05/2011; LVEF 45-50%, echo, 07/2011  . Essential hypertension, benign     . HLD (hyperlipidemia)   . NSTEMI (non-ST elevated myocardial infarction) ([place])    12/12, minor  . On home oxygen therapy    4Lnc with exertion  . Peripheral arterial disease ([place])   . Tobacco abuse     Past Surgical History:  Procedure Laterality Date  . ABDOMINAL SURGERY     2008  . COLONOSCOPY    . Right axillary to femoral bypass  3/12   Dr. Kellie Simmering  . RIGHT HYDROCELE REPAIR    . Right to left femoral to femoral bypass  3/12   Dr. Kellie Simmering  . SIGMOID RESECTION / RECTOPEXY  2005  . skin abscess drainage    . TOTAL HIP ARTHROPLASTY Left 02/19/2018   Procedure: LEFT TOTAL HIP ARTHROPLASTY;  Surgeon: Garald Balding, MD;  Location: [facility];  Service: Orthopedics;  Laterality: Left;    Social History:  reports that he quit smoking about 7 years ago. His smoking use included cigarettes. He started smoking about 58 years ago. He has a 110.00 pack-year smoking history. He has never used smokeless tobacco. He reports that he does not drink alcohol or use drugs.  Allergies: No Known Allergies  Medications Prior to Admission  Medication Sig Dispense Refill  . albuterol (PROAIR HFA) 108 (90 BASE) MCG/ACT inhaler Inhale 2 puffs into the lungs every 6 (six) hours as needed for shortness  of breath.     Marland Kitchen albuterol (PROVENTIL) (2.5 MG/3ML) 0.083% nebulizer solution Take 2.5 mg by nebulization every 6 (six) hours as needed for shortness of breath.     Marland Kitchen aspirin EC 81 MG tablet Take 1 tablet (81 mg total) by mouth daily.    . hydrochlorothiazide (HYDRODIURIL) 25 MG tablet Take 1 tablet (25 mg total) by mouth daily. 7 tablet 0  . HYDROcodone-acetaminophen (NORCO/VICODIN) 5-325 MG tablet Take 1 tablet by mouth 2 (two) times daily.  0  . isosorbide mononitrate (IMDUR) 60 MG 24 hr tablet Take 1 tablet (60 mg total) by mouth daily. 90 tablet 3  . lisinopril-hydrochlorothiazide (PRINZIDE,ZESTORETIC) 20-12.5 MG tablet Take 1 tablet by mouth daily.    . Menthol, Topical Analgesic, (BENGAY EX) Apply  topically.    . nitroGLYCERIN (NITROSTAT) 0.4 MG SL tablet Place 1 tablet (0.4 mg total) under the tongue every 5 (five) minutes x 3 doses as needed for chest pain. 25 tablet 3  . oxymetazoline (AFRIN) 0.05 % nasal spray Place 1 spray into both nostrils as needed for congestion.    . simvastatin (ZOCOR) 20 MG tablet TAKE 1 TABLET BY MOUTH EVERY DAY (Patient taking differently: Take 20 mg by mouth daily at 6 PM. ) 10 tablet 0  . tetrahydrozoline (VISINE) 0.05 % ophthalmic solution     . umeclidinium-vilanterol (ANORO ELLIPTA) 62.5-25 MCG/INH AEPB Inhale 1 puff daily into the lungs.       Physical Exam: Blood pressure 139/65, pulse (!) 107, temperature 97.9 F (36.6 C), temperature source Oral, resp. rate 19, height _0  (1.676 m), weight 85.3 kg, SpO2 98 %. General: pleasant, WD, WN white male who is laying in bed in NAD HEENT: head is normocephalic, atraumatic.  Sclera are noninjected.  PERRL.  Ears and nose without any masses or lesions, nasal cannula present with oxygen therapy going.  Mouth is pink and dry Heart: regular, rate, and rhythm.  Normal s1,s2. No obvious murmurs, gallops, or rubs noted.  Palpable radial and pedal pulses bilaterally Lungs: CTAB, no wheezes, rhonchi, or rales noted.  Respiratory effort nonlabored.  Nasal cannula in place Abd: soft, NT, mild distention, +BS, no masses or organomegaly.  To moderate size incisional hernias that are soft and reducible.  Multiple scars noted via midline incision as well as transverse lower abdominal incision. MS: all 4 extremities are symmetrical with no cyanosis, clubbing, or edema. Skin: warm and dry with no masses, lesions, or rashes Psych: A&Ox3 with an appropriate affect.   Results for orders placed or performed during the hospital encounter of 02/19/18 (from the past 48 hour(s))  CBC     Status: Abnormal   Collection Time: 02/20/18  5:08 AM  Result Value Ref Range   WBC 16.1 (H) 4.0 - 10.5 K/uL   RBC 3.05 (L) 4.22 - 5.81  MIL/uL   Hemoglobin 9.0 (L) 13.0 - 17.0 g/dL   HCT 27.9 (L) 39.0 - 52.0 %   MCV 91.5 78.0 - 100.0 fL   MCH 29.5 26.0 - 34.0 pg   MCHC 32.3 30.0 - 36.0 g/dL   RDW 14.4 11.5 - 15.5 %   Platelets 199 150 - 400 K/uL    Comment: Performed at [facility], [place] [address] [place], [place] [address]  Basic metabolic panel     Status: Abnormal   Collection Time: 02/20/18  5:08 AM  Result Value Ref Range   Sodium 143 135 - 145 mmol/L   Potassium 3.9 3.5 - 5.1  mmol/L    Comment: DELTA CHECK NOTED   Chloride 109 98 - 111 mmol/L   CO2 26 22 - 32 mmol/L   Glucose, Bld 157 (H) 70 - 99 mg/dL   BUN 26 (H) 8 - 23 mg/dL   Creatinine, Ser 1.30 (H) 0.61 - 1.24 mg/dL   Calcium 8.5 (L) 8.9 - 10.3 mg/dL   GFR calc non Af Amer 53 (L) >60 mL/min   GFR calc Af Amer >60 >60 mL/min    Comment: (NOTE) The eGFR has been calculated using the CKD EPI equation. This calculation has not been validated in all clinical situations. eGFR's persistently <60 mL/min signify possible Chronic Kidney Disease. CORRECTED ON 08/14 AT 3295: PREVIOUSLY REPORTED AS >60    Anion gap 8 5 - 15    Comment: Performed at [facility], [place] [address] [place], [place] [address]  CBC     Status: Abnormal   Collection Time: 02/21/18  3:30 AM  Result Value Ref Range   WBC 13.7 (H) 4.0 - 10.5 K/uL   RBC 3.05 (L) 4.22 - 5.81 MIL/uL   Hemoglobin 8.9 (L) 13.0 - 17.0 g/dL   HCT 27.9 (L) 39.0 - 52.0 %   MCV 91.5 78.0 - 100.0 fL   MCH 29.2 26.0 - 34.0 pg   MCHC 31.9 30.0 - 36.0 g/dL   RDW 14.8 11.5 - 15.5 %   Platelets 241 150 - 400 K/uL    Comment: Performed at [facility], [place] [address], [place], [place] [address]  Basic metabolic panel     Status: Abnormal   Collection Time: 02/21/18  3:30 AM  Result Value Ref Range   Sodium 141 135 - 145 mmol/L   Potassium 3.1 (L) 3.5 - 5.1 mmol/L   Chloride 106 98 - 111 mmol/L   CO2 28 22 - 32 mmol/L   Glucose, Bld 117 (H) 70 - 99 mg/dL   BUN 20 8 - 23 mg/dL    Creatinine, Ser 0.97 0.61 - 1.24 mg/dL   Calcium 8.4 (L) 8.9 - 10.3 mg/dL   GFR calc non Af Amer >60 >60 mL/min   GFR calc Af Amer >60 >60 mL/min    Comment: (NOTE) The eGFR has been calculated using the CKD EPI equation. This calculation has not been validated in all clinical situations. eGFR's persistently <60 mL/min signify possible Chronic Kidney Disease.    Anion gap 7 5 - 15    Comment: Performed at [facility] [address], [place], [place] [address]   Dg Chest 2 View  Result Date: 02/21/2018 CLINICAL DATA:  COPD.  Cough. EXAM: CHEST - 2 VIEW COMPARISON:  Earlier same day.  12/27/2017. FINDINGS: Cardiomegaly. Aortic atherosclerosis. Upper lungs show emphysema but no infiltrate. Increased markings are present at the lung bases, slightly above baseline particularly on the left. This could represent mild basilar pneumonia. No dense consolidation or lobar collapse. No effusion. No acute bone finding. Old midthoracic partial compression fracture. IMPRESSION: Background emphysema. Markings at the lung bases are more prominent than baseline, particularly on the left, suggesting mild basilar pneumonia. Electronically Signed   By: Nelson Chimes M.D.   On: 02/21/2018 11:43   Dg Abd 1 View  Result Date: 02/21/2018 CLINICAL DATA:  72 year old male with a history of postoperative ileus EXAM: ABDOMEN - 1 VIEW COMPARISON:  12/14/2017, CT 03/10/2017 FINDINGS: Gas within stomach, small bowel, colon, with mild colonic distention in the mid abdomen. No significant stool burden. Gas  extends to the rectum. Calcifications of the abdominal aorta and iliac arteries. Surgical changes of left hip arthroplasty. 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 IMPRESSION: Nonobstructive bowel gas pattern with mild distention of the colon, potentially ileus. Atherosclerosis. Left hip arthroplasty, with redemonstration of changes of right hip AVN Electronically Signed   By: Corrie Mckusick D.O.   On: 02/21/2018 10:13    Dg Chest Port 1 View  Result Date: 02/21/2018 CLINICAL DATA:  Acute respiratory distress EXAM: PORTABLE CHEST 1 VIEW COMPARISON:  01/07/2018 FINDINGS: Cardiac shadow is within normal limits. Aortic calcifications are noted. The lungs are well aerated bilaterally with mild scarring stable from the prior exam. No acute bony abnormality is seen. IMPRESSION: No acute abnormality noted. Electronically Signed   By: Inez Catalina M.D.   On: 02/21/2018 02:59      Assessment/Plan Nausea The patient does not have evidence of a postoperative ileus on his plain abdominal films; however, he acts as if he has a postoperative ileus.  For now, given he does not have any abdominal pain, we will treat him conservatively and not proceed with a CT scan as it does not seem necessary at this point.  He will be placed on MiraLAX and given a Fleet enema.  He will get 6 runs of IV potassium today to replace his hypokalemia.  We will defer oral potassium as this may make his nausea worse.  Recheck a bmet in the morning.  The patient needs to be mobilizing more than he is.  He has not been out of his bed today.  This will help with bowel motility as well.  Recheck a plain abdominal film in the morning.  We will follow with you.  Hypokalemia Potassium is 3.1 today.  Is can inhibit gut motility.  We will replace this today and recheck a bmet in the morning. Status post left hip arthroplasty Coronary artery disease COPD Peripheral artery disease Hypertension  FEN -n.p.o., IV fluids VTE -baby aspirin/TED hose/SCDs ID -none  Henreitta Cea, [facility] Surgery 02/21/2018, 12:55 PM Pager: [phone]

## 2018-02-21 NOTE — Significant Event (Signed)
Rapid Response Event Note Called by [name] RN for patient desaturating/nausea  Overview: Time Called: 0212 Arrival Time: 0228   (was on other call) Event Type: Respiratory  Initial Focused Assessment: Upon arrival, [name] is sitting upright in bed in a high fowlers position.  He is alert and oriented x4.  He states that he is having a little trouble catching his breath.  [name] RN reports that she and another staff member were pulling the patient up in bed when he suddenly reported SOB with desaturations to 88-92% and nausea.  He continues to have nausea/dry heaving.  Zofran given.  HR 94, RR 22 with sats 100% on NRB.  Some abdominal accessory muscle use.  Minimal expiratory wheeze on the R with bibasilar fine crackles.  After a few minutes of resting, we were able to wean [name] back to 4L [place].  He stated it was easier for him to get his breath.  I will order an CXR due to his risk of aspiration/nausea.  Interventions: -NRB mask, weaned back to 4L [place] while I was at bedside -PCXR -Albuterol neb (given) -Zofran for nausea  Plan of Care (if not transferred): -continue continuous pulse oximetry -monitor for respiratory distress -Notify primary svc of events and further clinical decompenstations  Event Summary: 0305- Sats 98% on 4L [place] with RR 16.  Tom Fillersees, [name] W

## 2018-02-21 NOTE — Progress Notes (Signed)
[facility] notified about patient SOB episode. Will continue to monitor. Patient is resting at this time.

## 2018-02-21 NOTE — Clinical Social Work Note (Signed)
Patient has accepted bed offer [facility]. They will start authorization in the morning. RN that typically does authorizations is gone for the day.  Charlynn CourtSarah Shaniqua Guillot, CSW [phone]

## 2018-02-21 NOTE — Discharge Summary (Signed)
[name], MD   [name], PA-C [address], [place], [place]  [address]                             [phone]  PATIENT ID: [name]        MRN:  [id number]          DOB/AGE: 1946/03/01 / 72 y.o.    DISCHARGE SUMMARY  ADMISSION DATE:    02/19/2018 DISCHARGE DATE:   02/22/2018   ADMISSION DIAGNOSIS: LEFT TOTAL HIP ARTHROPLASTY    DISCHARGE DIAGNOSIS:  LEFT TOTAL HIP ARTHROPLASTY    ADDITIONAL DIAGNOSIS: Principal Problem:   Primary osteoarthritis of left hip Active Problems:   Tobacco abuse   COPD (chronic obstructive pulmonary disease) (HCC)   Chronic respiratory failure (HCC)   Avascular necrosis of bone of hip, left (HCC)   S/P hip replacement, left  Past Medical History:  Diagnosis Date  . Asthma   . Carotid artery disease (HCC)    50-69% RICA and less than 50% LICA, 07/2011  . COPD (chronic obstructive pulmonary disease) (HCC)   . Coronary atherosclerosis of native coronary artery    Based on abnormal Myoview - inferior scar, 05/2011; LVEF 45-50%, echo, 07/2011  . Essential hypertension, benign   . HLD (hyperlipidemia)   . NSTEMI (non-ST elevated myocardial infarction) (HCC)    12/12, minor  . On home oxygen therapy    4Lnc with exertion  . Peripheral arterial disease (HCC)   . Tobacco abuse     PROCEDURE: Procedure(s): LEFT TOTAL HIP ARTHROPLASTY  on 02/19/2018  CONSULTS:  * Hospitalist Service  *General Surgery  HISTORY: [name], 72 y.o. male, has a history of pain and functional disability in the left hip(s) due to arthritis and patient has failed non-surgical conservative treatments for greater than 12 weeks to include NSAID's and/or analgesics, corticosteriod injections, supervised PT with diminished ADL's post treatment, use of assistive devices, weight reduction as appropriate and activity modification.  Onset of symptoms was gradual starting 3 years ago with gradually worsening course since that time.The patient noted no past  surgery on the left hip(s).  Patient currently rates pain in the left hip at 8 out of 10 with activity. Patient has night pain, worsening of pain with activity and weight bearing, trendelenberg gait, pain that interfers with activities of daily living and pain with passive range of motion. Patient has evidence of subchondral cysts, subchondral sclerosis, periarticular osteophytes and joint space narrowing by imaging studies. This condition presents safety issues increasing the risk of falls.  There is no current active infection.  HOSPITAL COURSE:  [name] is a 72 y.o. admitted on 02/19/2018 and found to have a diagnosis of OSTEOARTHRITIS  LEFT HIP.  After appropriate laboratory studies were obtained  they were taken to the operating room on 02/19/2018 and underwent  Procedure(s): LEFT TOTAL HIP ARTHROPLASTY  .   They were given perioperative antibiotics:  Anti-infectives (From admission, onward)   Start     Dose/Rate Route Frequency Ordered Stop   02/22/18 0000  azithromycin (ZITHROMAX) 250 MG tablet        02/21/18 1539     02/21/18 1100  azithromycin (ZITHROMAX) tablet 250 mg     250 mg Oral Daily 02/21/18 1058     02/19/18 1400  ceFAZolin (ANCEF) IVPB 1 g/50 mL premix     1 g 100 mL/hr over 30 Minutes Intravenous Every 6  hours 02/19/18 1135 02/19/18 2025   02/19/18 0630  ceFAZolin (ANCEF) IVPB 2g/100 mL premix     2 g 200 mL/hr over 30 Minutes Intravenous On call to O.R. 02/19/18 [id number] 02/19/18 0736    .  Tolerated the procedure well.  Placed with a foley intraoperatively.     POD #1, allowed out of bed to a chair.  PT for ambulation and exercise program.  Foley D/C'd in morning.  IV saline locked.  O2 continued.  Had consultation by Hospitalist and General Surgery for Hypoxia and Abdominal distention/ileus respectively.  KUB & CXR performed and evaluated by consultants.  Did not do well with PT.  POD #2, Had hypoxia at night and rapid response called and MD notified. Had  consultation by Hospitalist and General Surgery for Hypoxia and Abdominal distention/ileus respectively that morning.  KUB & CXR performed and evaluated by consultants.  Did not do well with PT.  POD #3. Had marked improvement.  Respiratory now baseline.  Pain well controlled.  Denies abdominal pain/discomfort.  The remainder of the hospital course was dedicated to ambulation and strengthening.   The patient was discharged on 3 Days Post-Op in  Stable condition.  Blood products given:none  DIAGNOSTIC STUDIES: Recent vital signs:  Patient Vitals for the past 24 hrs:  BP Temp Temp src Pulse Resp SpO2  02/22/18 0907 - - - - - 98 %  02/22/18 0404 (!) 129/58 98.3 F (36.8 C) Oral 99 19 94 %  02/21/18 2106 (!) 149/57 98.9 F (37.2 C) Oral (!) 116 20 97 %  02/21/18 1421 123/63 97.9 F (36.6 C) Oral 94 17 98 %       Recent laboratory studies: Recent Labs    02/19/18 0638 02/20/18 0508 02/21/18 0330 02/22/18 0305  WBC 11.3* 16.1* 13.7* 13.3*  HGB 11.1* 9.0* 8.9* 8.8*  HCT 34.9* 27.9* 27.9* 27.6*  PLT 230 199 241 242   Recent Labs    02/19/18 0638 02/20/18 0508 02/21/18 0330 02/22/18 0305  NA 140 143 141 139  K 2.8* 3.9 3.1* 3.4*  CL 105 109 106 103  CO2 25 26 28 28   BUN 22 26* 20 17  CREATININE 1.23 1.30* 0.97 0.96  GLUCOSE 119* 157* 117* 109*  CALCIUM 8.9 8.5* 8.4* 8.4*   Lab Results  Component Value Date   INR 1.18 02/01/2018   INR 1.13 12/29/2013   INR 1.13 09/14/2010     Recent Radiographic Studies :  Dg Chest 2 View  Result Date: 02/21/2018 CLINICAL DATA:  COPD.  Cough. EXAM: CHEST - 2 VIEW COMPARISON:  Earlier same day.  12/27/2017. FINDINGS: Cardiomegaly. Aortic atherosclerosis. Upper lungs show emphysema but no infiltrate. Increased markings are present at the lung bases, slightly above baseline particularly on the left. This could represent mild basilar pneumonia. No dense consolidation or lobar collapse. No effusion. No acute bone finding. Old midthoracic  partial compression fracture. IMPRESSION: Background emphysema. Markings at the lung bases are more prominent than baseline, particularly on the left, suggesting mild basilar pneumonia. Electronically Signed   By: Paulina FusiMark  Shogry M.D.   On: 02/21/2018 11:43   Dg Abd 1 View  Result Date: 02/21/2018 CLINICAL DATA:  72 year old male with a history of postoperative ileus EXAM: ABDOMEN - 1 VIEW COMPARISON:  12/14/2017, CT 03/10/2017 FINDINGS: Gas within stomach, small bowel, colon, with mild colonic distention in the mid abdomen. No significant stool burden. Gas extends to the rectum. Calcifications of the abdominal aorta and iliac arteries. Surgical changes of  left hip arthroplasty. 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 IMPRESSION: Nonobstructive bowel gas pattern with mild distention of the colon, potentially ileus. Atherosclerosis. Left hip arthroplasty, with redemonstration of changes of right hip AVN Electronically Signed   By: Gilmer Mor D.O.   On: 02/21/2018 10:13   Dg Chest Port 1 View  Result Date: 02/21/2018 CLINICAL DATA:  Acute respiratory distress EXAM: PORTABLE CHEST 1 VIEW COMPARISON:  01/07/2018 FINDINGS: Cardiac shadow is within normal limits. Aortic calcifications are noted. The lungs are well aerated bilaterally with mild scarring stable from the prior exam. No acute bony abnormality is seen. IMPRESSION: No acute abnormality noted. Electronically Signed   By: Alcide Clever M.D.   On: 02/21/2018 02:59   Dg Abd Portable 1v  Result Date: 02/22/2018 CLINICAL DATA:  Ileus EXAM: PORTABLE ABDOMEN - 1 VIEW COMPARISON:  02/21/2018 FINDINGS: Mild colonic gas and distension has progressed in the interval. Small amount of gas in the rectum. Small bowel nondilated. Left hip replacement with severe degenerative change in the right hip. IMPRESSION: Colonic ileus with mild progression. Electronically Signed   By: Marlan Palau M.D.   On: 02/22/2018 09:20   Dg Hip Port Unilat With Pelvis 1v  Left  Result Date: 02/19/2018 CLINICAL DATA:  Left total hip replacement EXAM: DG HIP (WITH OR WITHOUT PELVIS) 1V PORT LEFT COMPARISON:  None. FINDINGS: Changes of left total hip replacement. No hardware or bony complicating feature. Normal alignment. Overlying soft tissue gas. Advanced degenerative changes in the right hip with femoral head flattening, possibly related to AVN. IMPRESSION: Changes of left hip replacement. No hardware or bony complicating feature. Advanced degenerative changes and femoral head flattening in the right hip joint. Electronically Signed   By: Charlett Nose M.D.   On: 02/19/2018 11:00    DISCHARGE INSTRUCTIONS: Discharge Instructions    Call MD / Call 911   Complete by:  As directed    If you experience chest pain or shortness of breath, CALL 911 and be transported to the hospital emergency room.  If you develope a fever above 101 F, pus (white drainage) or increased drainage or redness at the wound, or calf pain, call your surgeon's office.   Change dressing   Complete by:  As directed    DO NOT CHANGE THE DRESSING   Constipation Prevention   Complete by:  As directed    Drink plenty of fluids.  Prune juice may be helpful.  You may use a stool softener, such as Colace (over the counter) 100 mg twice a day.  Use MiraLax (over the counter) for constipation as needed.   Diet general   Complete by:  As directed    Discharge instructions   Complete by:  As directed    INSTRUCTIONS AFTER JOINT REPLACEMENT   Remove items at home which could result in a fall. This includes throw rugs or furniture in walking pathways ICE to the affected joint every three hours while awake for 30 minutes at a time, for at least the first 3-5 days, and then as needed for pain and swelling.  Continue to use ice for pain and swelling. You may notice swelling that will progress down to the foot and ankle.  This is normal after surgery.  Elevate your leg when you are not up walking on it.    Continue to use the breathing machine you got in the hospital (incentive spirometer) which will help keep your temperature down.  It is common for your temperature  to cycle up and down following surgery, especially at night when you are not up moving around and exerting yourself.  The breathing machine keeps your lungs expanded and your temperature down.   DIET:  As you were doing prior to hospitalization, we recommend a well-balanced diet.  DRESSING / WOUND CARE / SHOWERING  Keep the surgical dressing until follow up.  The dressing is water proof, so you can shower without any extra covering.  IF THE DRESSING FALLS OFF or the wound gets wet inside, change the dressing with sterile gauze.  Please use good hand washing techniques before changing the dressing.  Do not use any lotions or creams on the incision until instructed by your surgeon.    ACTIVITY  Increase activity slowly as tolerated, but follow the weight bearing instructions below.   No driving for 6 weeks or until further direction given by your physician.  You cannot drive while taking narcotics.  No lifting or carrying greater than 10 lbs. until further directed by your surgeon. Avoid periods of inactivity such as sitting longer than an hour when not asleep. This helps prevent blood clots.  You may return to work once you are authorized by your doctor.     WEIGHT BEARING   Weight bearing as tolerated with assist device (walker, cane, etc) as directed, use it as long as suggested by your surgeon or therapist, typically at least 4-6 weeks.   EXERCISES  Results after joint replacement surgery are often greatly improved when you follow the exercise, range of motion and muscle strengthening exercises prescribed by your doctor. Safety measures are also important to protect the joint from further injury. Any time any of these exercises cause you to have increased pain or swelling, decrease what you are doing until you are comfortable  again and then slowly increase them. If you have problems or questions, call your caregiver or physical therapist for advice.   Rehabilitation is important following a joint replacement. After just a few days of immobilization, the muscles of the leg can become weakened and shrink (atrophy).  These exercises are designed to build up the tone and strength of the thigh and leg muscles and to improve motion. Often times heat used for twenty to thirty minutes before working out will loosen up your tissues and help with improving the range of motion but do not use heat for the first two weeks following surgery (sometimes heat can increase post-operative swelling).   These exercises can be done on a training (exercise) mat, on a table or on a bed. Use whatever works the best and is most comfortable for you.    Use music or television while you are exercising so that the exercises are a pleasant break in your day. This will make your life better with the exercises acting as a break in your routine that you can look forward to.   Perform all exercises about fifteen times, three times per day or as directed.  You should exercise both the operative leg and the other leg as well.   Exercises include:  Quad Sets - Tighten up the muscle on the front of the thigh (Quad) and hold for 5-10 seconds.   Straight Leg Raises - With your knee straight (if you were given a brace, keep it on), lift the leg to 60 degrees, hold for 3 seconds, and slowly lower the leg.  Perform this exercise against resistance later as your leg gets stronger.  Leg Slides: Lying on your  back, slowly slide your foot toward your buttocks, bending your knee up off the floor (only go as far as is comfortable). Then slowly slide your foot back down until your leg is flat on the floor again.  Angel Wings: Lying on your back spread your legs to the side as far apart as you can without causing discomfort.  Hamstring Strength:  Lying on your back, push your  heel against the floor with your leg straight by tightening up the muscles of your buttocks.  Repeat, but this time bend your knee to a comfortable angle, and push your heel against the floor.  You may put a pillow under the heel to make it more comfortable if necessary.   A rehabilitation program following joint replacement surgery can speed recovery and prevent re-injury in the future due to weakened muscles. Contact your doctor or a physical therapist for more information on knee rehabilitation.    CONSTIPATION  Constipation is defined medically as fewer than three stools per week and severe constipation as less than one stool per week.  Even if you have a regular bowel pattern at home, your normal regimen is likely to be disrupted due to multiple reasons following surgery.  Combination of anesthesia, postoperative narcotics, change in appetite and fluid intake all can affect your bowels.   YOU MUST use at least one of the following options; they are listed in order of increasing strength to get the job done.  They are all available over the counter, and you may need to use some, POSSIBLY even all of these options:    Drink plenty of fluids (prune juice may be helpful) and high fiber foods Colace 100 mg by mouth twice a day  Senokot for constipation as directed and as needed Dulcolax (bisacodyl), take with full glass of water  Miralax (polyethylene glycol) once or twice a day as needed.  If you have tried all these things and are unable to have a bowel movement in the first 3-4 days after surgery call either your surgeon or your primary doctor.    If you experience loose stools or diarrhea, hold the medications until you stool forms back up.  If your symptoms do not get better within 1 week or if they get worse, check with your doctor.  If you experience "the worst abdominal pain ever" or develop nausea or vomiting, please contact the office immediately for further recommendations for  treatment.   ITCHING:  If you experience itching with your medications, try taking only a single pain pill, or even half a pain pill at a time.  You can also use Benadryl over the counter for itching or also to help with sleep.   TED HOSE STOCKINGS:  Use stockings on both legs until for at least 2 weeks or as directed by physician office. They may be removed at night for sleeping.  MEDICATIONS:  See your medication summary on the "After Visit Summary" that nursing will review with you.  You may have some home medications which will be placed on hold until you complete the course of blood thinner medication.  It is important for you to complete the blood thinner medication as prescribed.  PRECAUTIONS:  If you experience chest pain or shortness of breath - call 911 immediately for transfer to the hospital emergency department.   If you develop a fever greater that 101 F, purulent drainage from wound, increased redness or drainage from wound, foul odor from the wound/dressing, or calf pain -  CONTACT YOUR SURGEON.                                                   FOLLOW-UP APPOINTMENTS:  If you do not already have a post-op appointment, please call the office for an appointment to be seen by your surgeon.  Guidelines for how soon to be seen are listed in your "After Visit Summary", but are typically between 1-4 weeks after surgery.  OTHER INSTRUCTIONS:   Knee Replacement:  Do not place pillow under knee, focus on keeping the knee straight while resting. CPM instructions: 0-90 degrees, 2 hours in the morning, 2 hours in the afternoon, and 2 hours in the evening. Place foam block, curve side up under heel at all times except when in CPM or when walking.  DO NOT modify, tear, cut, or change the foam block in any way.  MAKE SURE YOU:  Understand these instructions.  Get help right away if you are not doing well or get worse.    Thank you for letting us be a part of your medical care team.  It is a  privilege we respect greatly.  We hope these instructions will help you stay on track for a fast and full recovery!   Driving restrictions   Complete by:  As directed    No driving for 6 weeks   Follow the hip precautions as taught in Physical Therapy   Complete by:  As directed    Increase activity slowly as tolerated   Complete by:  As directed    Lifting restrictions   Complete by:  As directed    No lifting for 6 weeks   Patient may shower   Complete by:  As directed    You may shower over the brown dressing   TED hose   Complete by:  As directed    Use stockings (TED hose) for 3 weeks on left leg.  You may remove them at night for sleeping.   Weight bearing as tolerated   Complete by:  As directed    Laterality:  left   Extremity:  Lower      DISCHARGE MEDICATIONS:   Allergies as of 02/22/2018   No Known Allergies     Medication List    STOP taking these medications   aspirin EC 81 MG tablet Replaced by:  aspirin 81 MG chewable tablet   BENGAY EX   HYDROcodone-acetaminophen 5-325 MG tablet Commonly known as:  NORCO/VICODIN     TAKE these medications   ANORO ELLIPTA 62.5-25 MCG/INH Aepb Generic drug:  umeclidinium-vilanterol Inhale 1 puff daily into the lungs.   aspirin 81 MG chewable tablet Chew 1 tablet (81 mg total) by mouth 2 (two) times daily. Replaces:  aspirin EC 81 MG tablet   azithromycin 250 MG tablet Commonly known as:  ZITHROMAX Daily   hydrochlorothiazide 25 MG tablet Commonly known as:  HYDRODIURIL Take 1 tablet (25 mg total) by mouth daily.   isosorbide mononitrate 60 MG 24 hr tablet Commonly known as:  IMDUR Take 1 tablet (60 mg total) by mouth daily.   lisinopril-hydrochlorothiazide 20-12.5 MG tablet Commonly known as:  PRINZIDE,ZESTORETIC Take 1 tablet by mouth daily.   nitroGLYCERIN 0.4 MG SL tablet Commonly known as:  NITROSTAT Place 1 tablet (0.4 mg total) under the tongue every 5 (five) minutes x  3 doses as needed for chest  pain.   oxyCODONE 5 MG immediate release tablet Commonly known as:  Oxy IR/ROXICODONE Take 1-2 tablets (5-10 mg total) by mouth every 4 (four) hours as needed for moderate pain (pain score 4-6).   oxymetazoline 0.05 % nasal spray Commonly known as:  AFRIN Place 1 spray into both nostrils as needed for congestion.   PROAIR HFA 108 (90 Base) MCG/ACT inhaler Generic drug:  albuterol Inhale 2 puffs into the lungs every 6 (six) hours as needed for shortness of breath.   albuterol (2.5 MG/3ML) 0.083% nebulizer solution Commonly known as:  PROVENTIL Take 2.5 mg by nebulization every 6 (six) hours as needed for shortness of breath.   simvastatin 20 MG tablet Commonly known as:  ZOCOR TAKE 1 TABLET BY MOUTH EVERY DAY What changed:  when to take this   VISINE 0.05 % ophthalmic solution Generic drug:  tetrahydrozoline            Durable Medical Equipment  (From admission, onward)         Start     Ordered   02/19/18 1134  DME Walker rolling  Once    Question:  Patient needs a walker to treat with the following condition  Answer:  S/P total hip arthroplasty   02/19/18 1135   02/19/18 1134  DME 3 n 1  Once     02/19/18 1135   02/19/18 1134  DME Bedside commode  Once    Question:  Patient needs a bedside commode to treat with the following condition  Answer:  S/P total hip arthroplasty   02/19/18 1135           Discharge Care Instructions  (From admission, onward)         Start     Ordered   02/21/18 0000  Weight bearing as tolerated    Question Answer Comment  Laterality left   Extremity Lower      02/21/18 1539   02/21/18 0000  Change dressing    Comments:  DO NOT CHANGE THE DRESSING   02/21/18 1539          FOLLOW UP VISIT:    Contact information for follow-up providers    Valeria Batman, MD. Schedule an appointment as soon as possible for a visit on 03/06/2018.   Specialty:  Orthopedic Surgery Contact information: [address] [place]  [address] [phone]            Contact information for after-discharge care    Destination    [facility] Preferred SNF .   Service:  Skilled Nursing Contact information: [address] [place] [address] [phone]                  DISPOSITION:   Skilled Nursing Facility/Rehab  CONDITION:  Stable   Oris Drone. Aleda Grana [facility] [phone]  02/22/2018 1:44 PM

## 2018-02-21 NOTE — Plan of Care (Signed)
  Problem: Pain Management: Goal: Pain level will decrease with appropriate interventions Outcome: Progressing   Problem: Skin Integrity: Goal: Will show signs of wound healing Outcome: Progressing   Problem: Safety: Goal: Ability to remain free from injury will improve Outcome: Progressing   Problem: Activity: Goal: Risk for activity intolerance will decrease Outcome: Progressing

## 2018-02-21 NOTE — Progress Notes (Signed)
This  RN and a nurse tech pulled up patient in bed and patient said  He is feeling nauseas, SOB and patient started to choke on his  Saliva. Patient HR noted at 145 and  O2 at 4L at 92%.  NRB applied and Rapid response notified. VS recorded.

## 2018-02-21 NOTE — Progress Notes (Signed)
Physical Therapy Treatment Patient Details Name: [name] MRN: [id number] DOB: 04-08-1946 Today's Date: 02/21/2018    History of Present Illness [name] is a 72 y.o M s/p L THR Posterior Approach. PMH includes atherosclerosis, Peripheral Artery disease, Paresthesia of R foot, unstable angina, COPD, HTN, Chronic Respiratory Failure, HLD, CAD, Asthma.     PT Comments    Pt very limited this session secondary to nausea and emesis. Pt was able to participate in bed level therex for L LE strengthening (see below). Pt would continue to benefit from skilled physical therapy services at this time while admitted and after d/c to address the below listed limitations in order to improve overall safety and independence with functional mobility.    Follow Up Recommendations  SNF     Equipment Recommendations  Rolling walker with 5" wheels;3in1 (PT)    Recommendations for Other Services       Precautions / Restrictions Precautions Precautions: Posterior Hip Required Braces or Orthoses: Knee Immobilizer - Left Knee Immobilizer - Left: Other (comment)(when in bed) Restrictions Weight Bearing Restrictions: Yes LLE Weight Bearing: Weight bearing as tolerated    Mobility  Bed Mobility               General bed mobility comments: pt limited to bed level therex this session secondary to nausea and emesis x2  Transfers                    Ambulation/Gait                 Stairs             Wheelchair Mobility    Modified Rankin (Stroke Patients Only)       Balance                                            Cognition Arousal/Alertness: Awake/alert Behavior During Therapy: WFL for tasks assessed/performed Overall Cognitive Status: Within Functional Limits for tasks assessed                                        Exercises Total Joint Exercises Ankle Circles/Pumps: AROM;Both;20 reps;Supine Quad Sets:  AROM;Left;10 reps;Supine Short Arc Quad: AAROM;Left;10 reps;Supine Hip ABduction/ADduction: AAROM;Left;10 reps;Supine Straight Leg Raises: AAROM;Left;10 reps;Supine(within precautions)    General Comments        Pertinent Vitals/Pain Pain Assessment: Faces Faces Pain Scale: Hurts little more Pain Location: L Hip Pain Descriptors / Indicators: Guarding;Sore Pain Intervention(s): Monitored during session;Repositioned    Home Living                      Prior Function            PT Goals (current goals can now be found in the care plan section) Acute Rehab PT Goals PT Goal Formulation: With patient Time For Goal Achievement: 03/05/18 Potential to Achieve Goals: Fair Progress towards PT goals: Progressing toward goals    Frequency    7X/week      PT Plan Current plan remains appropriate    Co-evaluation              AM-PAC PT "6 Clicks" Daily Activity  Outcome Measure  Difficulty turning over in bed (including adjusting bedclothes, sheets and  blankets)?: A Lot Difficulty moving from lying on back to sitting on the side of the bed? : Unable Difficulty sitting down on and standing up from a chair with arms (e.g., wheelchair, bedside commode, etc,.)?: Unable Help needed moving to and from a bed to chair (including a wheelchair)?: A Little Help needed walking in hospital room?: A Little Help needed climbing 3-5 steps with a railing? : A Lot 6 Click Score: 12    End of Session Equipment Utilized During Treatment: Oxygen Activity Tolerance: Patient limited by pain;Patient limited by fatigue;Other (comment)(pt limited secondary to nausea and emesis) Patient left: in bed;with call bell/phone within reach Nurse Communication: Mobility status PT Visit Diagnosis: Other abnormalities of gait and mobility (R26.89);Pain Pain - Right/Left: Left Pain - part of body: Hip     Time: 0916-0926 PT Time Calculation (min) (ACUTE ONLY): 10 min  Charges:   $Therapeutic Exercise: 8-22 mins                     [name], [place]PT, [place]PT [phone]    Alessandra BevelsJennifer M Jasimine [name] 02/21/2018, 11:05 AM

## 2018-02-21 NOTE — Progress Notes (Signed)
Subjective: 2 Days Post-Op Procedure(s) (LRB): LEFT TOTAL HIP ARTHROPLASTY (Left) Patient reports pain as moderate.    Objective: Vital signs in last 24 hours: Temp:  [97.7 F (36.5 C)-98.4 F (36.9 C)] 97.9 F (36.6 C) (08/15 0451) Pulse Rate:  [79-107] 107 (08/15 0451) Resp:  [17-20] 19 (08/15 0451) BP: (126-154)/(54-79) 139/65 (08/15 0451) SpO2:  [96 %-99 %] 98 % (08/15 0451)  Intake/Output from previous day: 08/14 0701 - 08/15 0700 In: 360 [P.O.:360] Out: 750 [Urine:750] Intake/Output this shift: No intake/output data recorded.  Recent Labs    02/19/18 0638 02/20/18 0508 02/21/18 0330  HGB 11.1* 9.0* 8.9*   Recent Labs    02/20/18 0508 02/21/18 0330  WBC 16.1* 13.7*  RBC 3.05* 3.05*  HCT 27.9* 27.9*  PLT 199 241   Recent Labs    02/20/18 0508 02/21/18 0330  NA 143 141  K 3.9 3.1*  CL 109 106  CO2 26 28  BUN 26* 20  CREATININE 1.30* 0.97  GLUCOSE 157* 117*  CALCIUM 8.5* 8.4*    Intact pulses distally Dorsiflexion/Plantar flexion intact Incision: scant drainage  Abdomen distended with scant bowel sounds Lungs with expiratory wheezing   Anticipated LOS equal to or greater than 2 midnights due to - Age 73 and older with one or more of the following:  - Obesity  - Expected need for hospital services (PT, OT, Nursing) required for safe  discharge  - Anticipated need for postoperative skilled nursing care or inpatient rehab  - Active co-morbidities: Respiratory Failure/COPD OR   - Unanticipated findings during/Post Surgery: Slow post-op progression: GI, pain control, mobility  - Patient is a high risk of re-admission due to: None   Assessment/Plan: 2 Days Post-Op Procedure(s) (LRB): LEFT TOTAL HIP ARTHROPLASTY (Left)  Probable ileus with abdominal distention though has scant bowel sounds COPD Obesity  . Atherosclerosis of native arteries of the extremities with intermittent claudication 03/30/2014  . Aftercare following surgery of the  circulatory system, NEC 03/30/2014  . PAD (peripheral artery disease) (HCC) 03/30/2014  . Muscle cramps- Right  Flank / Sharyne Peachrubk 03/30/2014  . Paresthesia of right foot 03/30/2014  . Chest pain 12/30/2013  . Unstable angina (HCC) 12/29/2013  . Chest pain at rest 12/29/2013  . Chronic respiratory failure (HCC) 12/29/2013  . COPD (chronic obstructive pulmonary disease) (HCC)   . HTN (hypertension), malignant 10/07/2013  . Dyspnea 03/06/2013  . Tobacco abuse   . HLD (hyperlipidemia)   . Peripheral vascular disease, unspecified (HCC) 03/26/2012  . Coronary atherosclerosis of native coronary artery 07/28/2011  . Mixed hyperlipidemia 07/28/2011  . Essential hypertension, benign 07/28/2011  . Peripheral arterial disease (HCC) 07/28/2011  . Carotid artery disease (HCC) 07/28/2011    Plan: NPO CXR & KUB in x-ray Consulted Hospitalist for evaluation and treatment medically.  Discussed abdominal distention as well as COPD which appears worsening since yesterday.  Also, discussed Rapid Response consult last night.  She will see this AM.  I thanked her for kind assistance.    Jacqualine CodeBrian Petrarca 02/21/2018, 9:29 AM

## 2018-02-22 ENCOUNTER — Inpatient Hospital Stay (HOSPITAL_COMMUNITY): Payer: Medicare Other

## 2018-02-22 LAB — CBC
HCT: 27.6 % — ABNORMAL LOW (ref 39.0–52.0)
HEMOGLOBIN: 8.8 g/dL — AB (ref 13.0–17.0)
MCH: 29.3 pg (ref 26.0–34.0)
MCHC: 31.9 g/dL (ref 30.0–36.0)
MCV: 92 fL (ref 78.0–100.0)
Platelets: 242 10*3/uL (ref 150–400)
RBC: 3 MIL/uL — AB (ref 4.22–5.81)
RDW: 14.9 % (ref 11.5–15.5)
WBC: 13.3 10*3/uL — AB (ref 4.0–10.5)

## 2018-02-22 LAB — BASIC METABOLIC PANEL
Anion gap: 8 (ref 5–15)
BUN: 17 mg/dL (ref 8–23)
CHLORIDE: 103 mmol/L (ref 98–111)
CO2: 28 mmol/L (ref 22–32)
CREATININE: 0.96 mg/dL (ref 0.61–1.24)
Calcium: 8.4 mg/dL — ABNORMAL LOW (ref 8.9–10.3)
GFR calc non Af Amer: >60 mL/min (ref >60)
Glucose, Bld: 109 mg/dL — ABNORMAL HIGH (ref 70–99)
Potassium: 3.4 mmol/L — ABNORMAL LOW (ref 3.5–5.1)
SODIUM: 139 mmol/L (ref 135–145)

## 2018-02-22 MED ORDER — UMECLIDINIUM-VILANTEROL 62.5-25 MCG/INH IN AEPB
1.0000 | INHALATION_SPRAY | Freq: Every day | RESPIRATORY_TRACT | Status: DC
Start: 2018-02-22 — End: 2018-02-23
  Administered 2018-02-22 – 2018-02-23 (×2): 1 via RESPIRATORY_TRACT
  Filled 2018-02-22: qty 14

## 2018-02-22 MED ORDER — POTASSIUM CHLORIDE CRYS ER 20 MEQ PO TBCR
40.0000 meq | EXTENDED_RELEASE_TABLET | Freq: Once | ORAL | Status: AC
Start: 2018-02-22 — End: 2018-02-22
  Administered 2018-02-22: 40 meq via ORAL
  Filled 2018-02-22: qty 2

## 2018-02-22 NOTE — Progress Notes (Signed)
Physical Therapy Treatment Patient Details Name: [name] MRN: [id number] DOB: 1946/01/06 Today's Date: 02/22/2018    History of Present Illness Landry MellowBillie [name] is a 72 y.o M s/p L THR Posterior Approach. PMH includes atherosclerosis, Peripheral Artery disease, Paresthesia of R foot, unstable angina, COPD, HTN, Chronic Respiratory Failure, HLD, CAD, Asthma.     PT Comments    Pt tolerated increased ambulation distance this session. Assist required for all ther ex and cues to slow down. Pt with significant DOE during ambulation requiring standing rest break. Continues to remain appropriate for SNF placement for follow up therapies.    Follow Up Recommendations  SNF     Equipment Recommendations  Rolling walker with 5" wheels;3in1 (PT)    Recommendations for Other Services OT consult     Precautions / Restrictions Precautions Precautions: Posterior Hip Precaution Booklet Issued: Yes (comment) Precaution Comments: Pt unable to recall hip precautions. Required Braces or Orthoses: Knee Immobilizer - Left Knee Immobilizer - Left: Other (comment)(when in bed) Restrictions Weight Bearing Restrictions: Yes LLE Weight Bearing: Weight bearing as tolerated    Mobility  Bed Mobility Overal bed mobility: Needs Assistance Bed Mobility: Supine to Sit     Supine to sit: Min assist;HOB elevated     General bed mobility comments: pt using belt as leg lifter to assis LLE off EOB. Min A from PTA to elevate trunk.  Transfers Overall transfer level: Needs assistance Equipment used: Rolling walker (2 wheeled) Transfers: Sit to/from Stand Sit to Stand: Min guard         General transfer comment: cues for hand placement. no assist required  Ambulation/Gait Ambulation/Gait assistance: Min guard Gait Distance (Feet): 75 Feet Assistive device: Rolling walker (2 wheeled) Gait Pattern/deviations: Step-through pattern;Decreased step length - right;Decreased step length - left;Decreased  stride length Gait velocity: Decreased   General Gait Details: Pt with DOE during gait requiring 1 standing rest break. On 4 L O2. Significant SOB at end of gait. Initially unable to get reliable SpO2 reading. After ~3 min SpO2 at 94% and HR at 130 BPM.   Stairs             Wheelchair Mobility    Modified Rankin (Stroke Patients Only)       Balance Overall balance assessment: Needs assistance Sitting-balance support: No upper extremity supported;Feet supported Sitting balance-Leahy Scale: Fair     Standing balance support: No upper extremity supported;During functional activity Standing balance-Leahy Scale: Fair                              Cognition Arousal/Alertness: Awake/alert Behavior During Therapy: WFL for tasks assessed/performed Overall Cognitive Status: Within Functional Limits for tasks assessed                                        Exercises Total Joint Exercises Short Arc QuadBarbaraann Boys: AAROM;Left;10 reps;Seated(utalized belt as leg lifter to assist with ther ex) Heel Slides: AAROM;Left;10 reps;Seated;Limitations(utalized belt as leg lifter to assist with ther ex) Heel Slides Limitations: frequent cues required to maintain hip precautions Hip ABduction/ADduction: AAROM;Left;10 reps;Supine(utalized belt as leg lifter to assist with ther ex)    General Comments        Pertinent Vitals/Pain Pain Assessment: No/denies pain    Home Living  Prior Function            PT Goals (current goals can now be found in the care plan section) Acute Rehab PT Goals Patient Stated Goal: "to get back to golf" PT Goal Formulation: With patient Time For Goal Achievement: 03/05/18 Potential to Achieve Goals: Fair Progress towards PT goals: Progressing toward goals    Frequency    7X/week      PT Plan Current plan remains appropriate    Co-evaluation              AM-PAC PT "6 Clicks" Daily  Activity  Outcome Measure  Difficulty turning over in bed (including adjusting bedclothes, sheets and blankets)?: A Lot Difficulty moving from lying on back to sitting on the side of the bed? : Unable Difficulty sitting down on and standing up from a chair with arms (e.g., wheelchair, bedside commode, etc,.)?: Unable Help needed moving to and from a bed to chair (including a wheelchair)?: A Little Help needed walking in hospital room?: A Little Help needed climbing 3-5 steps with a railing? : A Lot 6 Click Score: 12    End of Session Equipment Utilized During Treatment: Oxygen;Gait belt Activity Tolerance: Patient tolerated treatment well Patient left: with call bell/phone within reach;in chair Nurse Communication: Mobility status PT Visit Diagnosis: Other abnormalities of gait and mobility (R26.89);Pain Pain - Right/Left: Left Pain - part of body: Hip     Time: [id number] PT Time Calculation (min) (ACUTE ONLY): 17 min  Charges:  $Gait Training: 8-22 mins                    Kallie LocksHannah Lauralie Blacksher, [place]PTA Pager [phone] Acute Rehab  Sheral ApleyHannah E Altonio Schwertner 02/22/2018, 1:51 PM

## 2018-02-22 NOTE — Plan of Care (Signed)
  Problem: Education: Goal: Knowledge of General Education information will improve Description Including pain rating scale, medication(s)/side effects and non-pharmacologic comfort measures Outcome: Progressing Note:  POC and pain management reviewed with pt.   

## 2018-02-22 NOTE — Progress Notes (Signed)
PATIENT ID: [name]        MRN:  [id number]          DOB/AGE: 17-Jun-1946 / 72 y.o.    Tom CampbellPeter Keyandra Swenson, MD   Jacqualine CodeBrian Petrarca, PA-C [address] [place], [place]  [id number]                             [phone]   PROGRESS NOTE  Subjective:  negative for Chest Pain  negative for Shortness of Breath  positive for Nausea/Vomiting -had several BM's yesterday and tolerating diet but had a small amount of vomit this am-active bowel sounds and no abdominal pain  negative for Calf Pain    Tolerating Diet: yes         Patient reports pain as mild.     Comfortable night  Objective: Vital signs in last 24 hours:    Patient Vitals for the past 24 hrs:  BP Temp Temp src Pulse Resp SpO2  02/22/18 0404 (!) 129/58 98.3 F (36.8 C) Oral 99 19 94 %  02/21/18 2106 (!) 149/57 98.9 F (37.2 C) Oral (!) 116 20 97 %  02/21/18 1421 123/63 97.9 F (36.6 C) Oral 94 17 98 %      Intake/Output from previous day:   08/15 0701 - 08/16 0700 In: 477 [P.O.:477] Out: 800 [Urine:800]   Intake/Output this shift:   No intake/output data recorded.   Intake/Output      08/15 0701 - 08/16 0700 08/16 0701 - 08/17 0700   P.O. 477    Total Intake(mL/kg) 477 (5.6)    Urine (mL/kg/hr) 800 (0.4)    Stool 0    Total Output 800    Net -323         Stool Occurrence 1 x       LABORATORY DATA: Recent Labs    02/19/18 0638 02/20/18 0508 02/21/18 0330 02/22/18 0305  WBC 11.3* 16.1* 13.7* 13.3*  HGB 11.1* 9.0* 8.9* 8.8*  HCT 34.9* 27.9* 27.9* 27.6*  PLT 230 199 241 242   Recent Labs    02/19/18 0638 02/20/18 0508 02/21/18 0330 02/22/18 0305  NA 140 143 141 139  K 2.8* 3.9 3.1* 3.4*  CL 105 109 106 103  CO2 25 26 28 28   BUN 22 26* 20 17  CREATININE 1.23 1.30* 0.97 0.96  GLUCOSE 119* 157* 117* 109*  CALCIUM 8.9 8.5* 8.4* 8.4*   Lab Results  Component Value Date   INR 1.18 02/01/2018   INR 1.13 12/29/2013   INR 1.13 09/14/2010    Recent Radiographic Studies :  Dg Chest 2  View  Result Date: 02/21/2018 CLINICAL DATA:  COPD.  Cough. EXAM: CHEST - 2 VIEW COMPARISON:  Earlier same day.  12/27/2017. FINDINGS: Cardiomegaly. Aortic atherosclerosis. Upper lungs show emphysema but no infiltrate. Increased markings are present at the lung bases, slightly above baseline particularly on the left. This could represent mild basilar pneumonia. No dense consolidation or lobar collapse. No effusion. No acute bone finding. Old midthoracic partial compression fracture. IMPRESSION: Background emphysema. Markings at the lung bases are more prominent than baseline, particularly on the left, suggesting mild basilar pneumonia. Electronically Signed   By: Paulina FusiMark  Shogry M.D.   On: 02/21/2018 11:43   Dg Abd 1 View  Result Date: 02/21/2018 CLINICAL DATA:  72 year old male with a history of postoperative ileus EXAM: ABDOMEN - 1 VIEW COMPARISON:  12/14/2017, CT 03/10/2017 FINDINGS: Gas within stomach,  small bowel, colon, with mild colonic distention in the mid abdomen. No significant stool burden. Gas extends to the rectum. Calcifications of the abdominal aorta and iliac arteries. Surgical changes of left hip arthroplasty. 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 IMPRESSION: Nonobstructive bowel gas pattern with mild distention of the colon, potentially ileus. Atherosclerosis. Left hip arthroplasty, with redemonstration of changes of right hip AVN Electronically Signed   By: Gilmer MorJaime  Wagner D.O.   On: 02/21/2018 10:13   Dg Chest Port 1 View  Result Date: 02/21/2018 CLINICAL DATA:  Acute respiratory distress EXAM: PORTABLE CHEST 1 VIEW COMPARISON:  01/07/2018 FINDINGS: Cardiac shadow is within normal limits. Aortic calcifications are noted. The lungs are well aerated bilaterally with mild scarring stable from the prior exam. No acute bony abnormality is seen. IMPRESSION: No acute abnormality noted. Electronically Signed   By: Alcide CleverMark  Lukens M.D.   On: 02/21/2018 02:59   Dg Hip Port Unilat With Pelvis 1v  Left  Result Date: 02/19/2018 CLINICAL DATA:  Left total hip replacement EXAM: DG HIP (WITH OR WITHOUT PELVIS) 1V PORT LEFT COMPARISON:  None. FINDINGS: Changes of left total hip replacement. No hardware or bony complicating feature. Normal alignment. Overlying soft tissue gas. Advanced degenerative changes in the right hip with femoral head flattening, possibly related to AVN. IMPRESSION: Changes of left hip replacement. No hardware or bony complicating feature. Advanced degenerative changes and femoral head flattening in the right hip joint. Electronically Signed   By: Charlett NoseKevin  Dover M.D.   On: 02/19/2018 11:00     Examination:  General appearance: alert, cooperative and no distress  Wound Exam: clean, dry, intact   Drainage:  None: wound tissue dry  Motor Exam: EHL, FHL, Anterior Tibial and Posterior Tibial Intact  Sensory Exam: Superficial Peroneal, Deep Peroneal and Tibial normal  Vascular Exam: Normal  Assessment:    3 Days Post-Op  Procedure(s) (LRB): LEFT TOTAL HIP ARTHROPLASTY (Left)  ADDITIONAL DIAGNOSIS:  Principal Problem:   Primary osteoarthritis of left hip Active Problems:   Tobacco abuse   COPD (chronic obstructive pulmonary disease) (HCC)   Chronic respiratory failure (HCC)   Avascular necrosis of bone of hip, left (HCC)   S/P hip replacement, left  Acute Blood Loss Anemia and Hypokalemia   Plan: Physical Therapy as ordered Weight Bearing as Tolerated (WBAT)  DVT Prophylaxis:  Aspirin  DISCHARGE PLAN: Skilled Nursing Facility/Rehab  DISCHARGE NEEDS: HHPT, Walker and 3-in-1 comode seat  No respiratory distress this am-afebrile. Tolerating diet. Abdomen without pain and active bowel sounds. Left hip dressing clean and dry-dressing changed. If cleared by hospitalist and GS will [place]. Voiding without difficulty   Jacqualine CodeBrian Petrarca, Cordelia Poche-C [facility]  02/22/2018 7:31 AM  Patient ID: Tom LosBillie R [name], male   DOB: [birth year], 72  y.o.   MRN: [id number]

## 2018-02-22 NOTE — Progress Notes (Signed)
3 Days Post-Op   Subjective/Chief Complaint: Multiple bms feels better    Objective: Vital signs in last 24 hours: Temp:  [97.9 F (36.6 C)-98.9 F (37.2 C)] 98.3 F (36.8 C) (08/16 0404) Pulse Rate:  [94-116] 99 (08/16 0404) Resp:  [17-20] 19 (08/16 0404) BP: (123-149)/(57-63) 129/58 (08/16 0404) SpO2:  [94 %-98 %] 98 % (08/16 0907) Last BM Date: 02/21/18  Intake/Output from previous day: 08/15 0701 - 08/16 0700 In: 477 [P.O.:477] Out: 800 [Urine:800] Intake/Output this shift: No intake/output data recorded.  abd stable reducible ventral hernias, softer nontender  Lab Results:  Recent Labs    02/21/18 0330 02/22/18 0305  WBC 13.7* 13.3*  HGB 8.9* 8.8*  HCT 27.9* 27.6*  PLT 241 242   BMET Recent Labs    02/21/18 0330 02/22/18 0305  NA 141 139  K 3.1* 3.4*  CL 106 103  CO2 28 28  GLUCOSE 117* 109*  BUN 20 17  CREATININE 0.97 0.96  CALCIUM 8.4* 8.4*   PT/INR No results for input(s): LABPROT, INR in the last 72 hours. ABG No results for input(s): PHART, HCO3 in the last 72 hours.  Invalid input(s): PCO2, PO2  Studies/Results: Dg Chest 2 View  Result Date: 02/21/2018 CLINICAL DATA:  COPD.  Cough. EXAM: CHEST - 2 VIEW COMPARISON:  Earlier same day.  12/27/2017. FINDINGS: Cardiomegaly. Aortic atherosclerosis. Upper lungs show emphysema but no infiltrate. Increased markings are present at the lung bases, slightly above baseline particularly on the left. This could represent mild basilar pneumonia. No dense consolidation or lobar collapse. No effusion. No acute bone finding. Old midthoracic partial compression fracture. IMPRESSION: Background emphysema. Markings at the lung bases are more prominent than baseline, particularly on the left, suggesting mild basilar pneumonia. Electronically Signed   By: Paulina FusiMark  Shogry M.D.   On: 02/21/2018 11:43   Dg Abd 1 View  Result Date: 02/21/2018 CLINICAL DATA:  72 year old male with a history of postoperative ileus EXAM:  ABDOMEN - 1 VIEW COMPARISON:  12/14/2017, CT 03/10/2017 FINDINGS: Gas within stomach, small bowel, colon, with mild colonic distention in the mid abdomen. No significant stool burden. Gas extends to the rectum. Calcifications of the abdominal aorta and iliac arteries. Surgical changes of left hip arthroplasty. 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 IMPRESSION: Nonobstructive bowel gas pattern with mild distention of the colon, potentially ileus. Atherosclerosis. Left hip arthroplasty, with redemonstration of changes of right hip AVN Electronically Signed   By: Gilmer MorJaime  Wagner D.O.   On: 02/21/2018 10:13   Dg Chest Port 1 View  Result Date: 02/21/2018 CLINICAL DATA:  Acute respiratory distress EXAM: PORTABLE CHEST 1 VIEW COMPARISON:  01/07/2018 FINDINGS: Cardiac shadow is within normal limits. Aortic calcifications are noted. The lungs are well aerated bilaterally with mild scarring stable from the prior exam. No acute bony abnormality is seen. IMPRESSION: No acute abnormality noted. Electronically Signed   By: Alcide CleverMark  Lukens M.D.   On: 02/21/2018 02:59   Dg Abd Portable 1v  Result Date: 02/22/2018 CLINICAL DATA:  Ileus EXAM: PORTABLE ABDOMEN - 1 VIEW COMPARISON:  02/21/2018 FINDINGS: Mild colonic gas and distension has progressed in the interval. Small amount of gas in the rectum. Small bowel nondilated. Left hip replacement with severe degenerative change in the right hip. IMPRESSION: Colonic ileus with mild progression. Electronically Signed   By: Marlan Palauharles  Clark M.D.   On: 02/22/2018 09:20    Anti-infectives: Anti-infectives (From admission, onward)   Start     Dose/Rate Route Frequency Ordered Stop  02/22/18 0000  azithromycin (ZITHROMAX) 250 MG tablet        02/21/18 1539     02/21/18 1100  azithromycin (ZITHROMAX) tablet 250 mg     250 mg Oral Daily 02/21/18 1058     02/19/18 1400  ceFAZolin (ANCEF) IVPB 1 g/50 mL premix     1 g 100 mL/hr over 30 Minutes Intravenous Every 6 hours 02/19/18  1135 02/19/18 2025   02/19/18 0630  ceFAZolin (ANCEF) IVPB 2g/100 mL premix     2 g 200 mL/hr over 30 Minutes Intravenous On call to O.R. 02/19/18 0617 02/19/18 0736      Assessment/Plan: Colonic ileus  -this appears resolving -can advance diet as tolerated -can dc when desired -his potassium needs to be replaced and then followed to make sure this is normal -he also needs to be on bowel regimen and be up moving around a lot.  He is at risk of recurrence for this issue.   Emelia LoronMatthew Mae Cianci 02/22/2018

## 2018-02-22 NOTE — Evaluation (Signed)
Occupational Therapy Evaluation Patient Details Name: [name] MRN: [id number] DOB: 12-Jan-1946 Today's Date: 02/22/2018    History of Present Illness [name] is a 72 y.o M s/p L THR Posterior Approach. PMH includes atherosclerosis, Peripheral Artery disease, Paresthesia of R foot, unstable angina, COPD, HTN, Chronic Respiratory Failure, HLD, CAD, Asthma.    Clinical Impression   Pt is eager to get out of hospital and finish rehab so he can go home. Pt does not have reliable assistance at home to assist him and remains a fall risk. Pt is unable to recall his posterior hip precautions, reeducated. Instructed and practiced use of AE for LB ADL. Educated pt and daughter on need to heighten pt's sitting surfaces at home ie: toilet with 3 in 1 and his recliner. Continue to recommend ST rehab in SNF prior to going home so he may only require intermittent assistance.    Follow Up Recommendations  Follow surgeon's recommendation for DC plan and follow-up therapies;SNF    Equipment Recommendations       Recommendations for Other Services       Precautions / Restrictions Precautions Precautions: Posterior Hip Precaution Booklet Issued: Yes (comment) Precaution Comments: Pt unable to recall hip precautions, reeducated. Required Braces or Orthoses: Knee Immobilizer - Left Knee Immobilizer - Left: Other (comment)(in bed) Restrictions Weight Bearing Restrictions: Yes LLE Weight Bearing: Weight bearing as tolerated      Mobility Bed Mobility      General bed mobility comments: pt in chair, reports difficulty getting L LE OOB, gave pt a gait belt to take with him to use as leg lifter  Transfers Overall transfer level: Needs assistance Equipment used: Rolling walker (2 wheeled) Transfers: Sit to/from Stand Sit to Stand: Min guard         General transfer comment: cues for technique, no physical assist    Balance Overall balance assessment: Needs  assistance Sitting-balance support: No upper extremity supported;Feet supported Sitting balance-Leahy Scale: Good     Standing balance support: No upper extremity supported;During functional activity Standing balance-Leahy Scale: Poor Standing balance comment: fair statically                           ADL either performed or assessed with clinical judgement   ADL Overall ADL's : Needs assistance/impaired     Grooming: Min guard;Standing         Lower Body Bathing Details (indicate cue type and reason): educated in use of long handled bath sponge     Lower Body Dressing: Minimal assistance;Sit to/from stand Lower Body Dressing Details (indicate cue type and reason): educated in use of reacher, sock aide  Toilet Transfer: Ambulation;RW;Min guard   Toileting- ArchitectClothing Manipulation and Hygiene: Min guard;Sit to/from stand       Functional mobility during ADLs: Min guard;Rolling walker General ADL Comments: educated pt and daughter in need to heighten chairs at home and place bricks under his rocker recliner to prevent rocking, instructed in use of 3 in 1 over toilet     Vision         Perception     Praxis      Pertinent Vitals/Pain Pain Assessment: Faces Faces Pain Scale: Hurts little more Pain Location: L Hip Pain Descriptors / Indicators: Guarding;Sore Pain Intervention(s): Monitored during session     Hand Dominance     Extremity/Trunk Assessment             Communication  Cognition Arousal/Alertness: Awake/alert Behavior During Therapy: WFL for tasks assessed/performed Overall Cognitive Status: Impaired/Different from baseline Area of Impairment: Memory                     Memory: Decreased short-term memory;Decreased recall of precautions             General Comments       Exercises Exercises: Total Joint Total Joint Exercises Short Arc Quad: AAROM;Left;10 reps;Seated(utalized belt as leg lifter to assist with  ther ex) Heel Slides: AAROM;Left;10 reps;Seated;Limitations(utalized belt as leg lifter to assist with ther ex) Heel Slides Limitations: frequent cues required to maintain hip precautions Hip ABduction/ADduction: AAROM;Left;10 reps;Supine(utalized belt as leg lifter to assist with ther ex)   Shoulder Instructions      Home Living                                          Prior Functioning/Environment                   OT Problem List:        OT Treatment/Interventions:      OT Goals(Current goals can be found in the care plan section) Acute Rehab OT Goals Patient Stated Goal: "to get back to golf" OT Goal Formulation: With patient Time For Goal Achievement: 03/06/18 Potential to Achieve Goals: Good  OT Frequency: Min 2X/week   Barriers to D/C:            Co-evaluation              AM-PAC PT "6 Clicks" Daily Activity     Outcome Measure Help from another person eating meals?: None Help from another person taking care of personal grooming?: A Little Help from another person toileting, which includes using toliet, bedpan, or urinal?: A Little Help from another person bathing (including washing, rinsing, drying)?: A Little Help from another person to put on and taking off regular upper body clothing?: None Help from another person to put on and taking off regular lower body clothing?: A Little 6 Click Score: 20   End of Session Equipment Utilized During Treatment: Gait belt;Rolling walker  Activity Tolerance: Patient tolerated treatment well Patient left: in chair;with call bell/phone within reach;with chair alarm set;with family/visitor present  OT Visit Diagnosis: Other abnormalities of gait and mobility (R26.89);Pain;Other symptoms and signs involving cognitive function                Time: 1525-1550 OT Time Calculation (min): 25 min Charges:  OT General Charges $OT Visit: 1 Visit OT Treatments $Self Care/Home Management : 23-37  mins  02/22/2018 Martie RoundJulie Shakedra Beam, OTR/L Pager: [phone]  Iran PlanasMayberry, [place] BailiffJulie Lynn 02/22/2018, 4:06 PM

## 2018-02-22 NOTE — Clinical Social Work Note (Signed)
[facility].   Tom MediateBridget Amirah [name], MSW [phone]

## 2018-02-22 NOTE — Progress Notes (Signed)
PROGRESS NOTE    Tom LosBillie R Misiaszek  IRW:[id number] DOB: 11-Mar-1946 DOA: 02/19/2018 PCP: [name], Ashish, MD   Brief Narrative:  [name] is an 72 y.o. male with Irine Heminger history of osteoarthritis, hypertension, hyperlipidemia, peripheral vascular disease, carotid artery disease, CAD status post STEMI in 2012, tobacco abuse, COPD-asthma on 4 L at home, admitted on 02/19/2018 for left hip replacement after failing to conservative measures at home.  He was recuperating fairly well from the orthopedic procedure, but around 2 AM, the patient became dyspneic, suddenly dropping his saturations between 88 and 92%, accompanying by dry heaving and nausea.  He was placed on NRB, as he was using some abdominal accessory muscle use, eventually, and after receiving albuterol, and Zofran, the patient was re-placed on his 4 L of nasal cannula and more stable since then.  Pulse ox has been in the 98 at 4 L, with normal respirations. Outpatient, he is being followed by Dr. Vassie LollAlva, pulmonology, and has had Dayanis Bergquist clearance prior to this operation.  He reported that lung function is at 66%.  At the time, he was recommended to continue an oral once daily, and albuterol as needed.  He denies any fever, chills or night sweats.  He denies any chest pain or palpitations, he denies any known recent infections.    He's feeling better this morning from Alaynah Schutter respiratory standpoint.  Back to his baseline.  Planning to d/c to SNF today.   Assessment & Plan:   Principal Problem:   Primary osteoarthritis of left hip Active Problems:   Tobacco abuse   COPD (chronic obstructive pulmonary disease) (HCC)   Chronic respiratory failure (HCC)   Avascular necrosis of bone of hip, left (HCC)   S/P hip replacement, left  Acute on chronic respiratory failure 2/2 COPD exacerbation:  Improved at this time, back on 4 L [place], pt notes he feels back to his baseline CXR with possible mild L basilar pneumonia, but PCT not impressive and feeling better.  Will  hold off on antibiotics and steroids at this time. Continue anoro ellipta Would schedule albuterol every 4 hours as needed for next 24-48 hours after discharge and then use as needed Would arrange follow up with Dr. Vassie LollAlva from pulmonology [facility] for discharge from respiratory standpoint  Hypokalemia: replete and follow  Ileus: per general surgery  S/p L hip arthroplasty: per orthopedics  DVT prophylaxis: asa Code Status: full  Family Communication: none at bedside Disposition Plan: per primary  Orthopedics is primary  TRH and Gen surg following  Procedures:  L total hip replacement  Antimicrobials:  Anti-infectives (From admission, onward)   Start     Dose/Rate Route Frequency Ordered Stop   02/22/18 0000  azithromycin (ZITHROMAX) 250 MG tablet        02/21/18 1539     02/21/18 1100  azithromycin (ZITHROMAX) tablet 250 mg     250 mg Oral Daily 02/21/18 1058     02/19/18 1400  ceFAZolin (ANCEF) IVPB 1 g/50 mL premix     1 g 100 mL/hr over 30 Minutes Intravenous Every 6 hours 02/19/18 1135 02/19/18 2025   02/19/18 0630  ceFAZolin (ANCEF) IVPB 2g/100 mL premix     2 g 200 mL/hr over 30 Minutes Intravenous On call to O.R. 02/19/18 0617 02/19/18 0736         Subjective: Feels better. SOB is at baseline. Uses 4 L O2 at all times.  Objective: Vitals:   02/21/18 1421 02/21/18 2106 02/22/18 0404 02/22/18 [id number]  BP: 123/63 (!) 149/57 (!) 129/58   Pulse: 94 (!) 116 99   Resp: 17 20 19    Temp: 97.9 F (36.6 C) 98.9 F (37.2 C) 98.3 F (36.8 C)   TempSrc: Oral Oral Oral   SpO2: 98% 97% 94% 98%  Weight:      Height:        Intake/Output Summary (Last 24 hours) at 02/22/2018 1147 Last data filed at 02/22/2018 1036 Gross per 24 hour  Intake 720 ml  Output 400 ml  Net 320 ml   Filed Weights   02/19/18 1137  Weight: 85.3 kg    Examination:  General exam: Appears calm and comfortable  Respiratory system: No increased wob, scattered wheezing.  On 4 L  [place]. Cardiovascular system: S1 & S2 heard, RRR.  Gastrointestinal system: Abdomen is nondistended, soft and nontender.  Central nervous system: Alert and oriented. No focal neurological deficits. Extremities: no LEE Skin: No rashes, lesions or ulcers Psychiatry: Judgement and insight appear normal. Mood & affect appropriate.     Data Reviewed: I have personally reviewed following labs and imaging studies  CBC: Recent Labs  Lab 02/19/18 0638 02/20/18 0508 02/21/18 0330 02/22/18 0305  WBC 11.3* 16.1* 13.7* 13.3*  HGB 11.1* 9.0* 8.9* 8.8*  HCT 34.9* 27.9* 27.9* 27.6*  MCV 93.3 91.5 91.5 92.0  PLT 230 199 241 242   Basic Metabolic Panel: Recent Labs  Lab 02/19/18 0638 02/20/18 0508 02/21/18 0330 02/22/18 0305  NA 140 143 141 139  K 2.8* 3.9 3.1* 3.4*  CL 105 109 106 103  CO2 25 26 28 28   GLUCOSE 119* 157* 117* 109*  BUN 22 26* 20 17  CREATININE 1.23 1.30* 0.97 0.96  CALCIUM 8.9 8.5* 8.4* 8.4*   GFR: Estimated Creatinine Clearance: 70.2 mL/min (by C-G formula based on SCr of 0.96 mg/dL). Liver Function Tests: No results for input(s): AST, ALT, ALKPHOS, BILITOT, PROT, ALBUMIN in the last 168 hours. No results for input(s): LIPASE, AMYLASE in the last 168 hours. No results for input(s): AMMONIA in the last 168 hours. Coagulation Profile: No results for input(s): INR, PROTIME in the last 168 hours. Cardiac Enzymes: No results for input(s): CKTOTAL, CKMB, CKMBINDEX, TROPONINI in the last 168 hours. BNP (last 3 results) No results for input(s): PROBNP in the last 8760 hours. HbA1C: No results for input(s): HGBA1C in the last 72 hours. CBG: No results for input(s): GLUCAP in the last 168 hours. Lipid Profile: No results for input(s): CHOL, HDL, LDLCALC, TRIG, CHOLHDL, LDLDIRECT in the last 72 hours. Thyroid Function Tests: No results for input(s): TSH, T4TOTAL, FREET4, T3FREE, THYROIDAB in the last 72 hours. Anemia Panel: No results for input(s): VITAMINB12, FOLATE,  FERRITIN, TIBC, IRON, RETICCTPCT in the last 72 hours. Sepsis Labs: Recent Labs  Lab 02/21/18 1429  PROCALCITON 0.13    No results found for this or any previous visit (from the past 240 hour(s)).       Radiology Studies: Dg Chest 2 View  Result Date: 02/21/2018 CLINICAL DATA:  COPD.  Cough. EXAM: CHEST - 2 VIEW COMPARISON:  Earlier same day.  12/27/2017. FINDINGS: Cardiomegaly. Aortic atherosclerosis. Upper lungs show emphysema but no infiltrate. Increased markings are present at the lung bases, slightly above baseline particularly on the left. This could represent mild basilar pneumonia. No dense consolidation or lobar collapse. No effusion. No acute bone finding. Old midthoracic partial compression fracture. IMPRESSION: Background emphysema. Markings at the lung bases are more prominent than baseline, particularly on the left, suggesting  mild basilar pneumonia. Electronically Signed   By: Paulina Fusi M.D.   On: 02/21/2018 11:43   Dg Abd 1 View  Result Date: 02/21/2018 CLINICAL DATA:  72 year old male with Prescious Hurless history of postoperative ileus EXAM: ABDOMEN - 1 VIEW COMPARISON:  12/14/2017, CT 03/10/2017 FINDINGS: Gas within stomach, small bowel, colon, with mild colonic distention in the mid abdomen. No significant stool burden. Gas extends to the rectum. Calcifications of the abdominal aorta and iliac arteries. Surgical changes of left hip arthroplasty. 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 IMPRESSION: Nonobstructive bowel gas pattern with mild distention of the colon, potentially ileus. Atherosclerosis. Left hip arthroplasty, with redemonstration of changes of right hip AVN Electronically Signed   By: Gilmer Mor D.O.   On: 02/21/2018 10:13   Dg Chest Port 1 View  Result Date: 02/21/2018 CLINICAL DATA:  Acute respiratory distress EXAM: PORTABLE CHEST 1 VIEW COMPARISON:  01/07/2018 FINDINGS: Cardiac shadow is within normal limits. Aortic calcifications are noted. The lungs are well  aerated bilaterally with mild scarring stable from the prior exam. No acute bony abnormality is seen. IMPRESSION: No acute abnormality noted. Electronically Signed   By: Alcide Clever M.D.   On: 02/21/2018 02:59   Dg Abd Portable 1v  Result Date: 02/22/2018 CLINICAL DATA:  Ileus EXAM: PORTABLE ABDOMEN - 1 VIEW COMPARISON:  02/21/2018 FINDINGS: Mild colonic gas and distension has progressed in the interval. Small amount of gas in the rectum. Small bowel nondilated. Left hip replacement with severe degenerative change in the right hip. IMPRESSION: Colonic ileus with mild progression. Electronically Signed   By: Marlan Palau M.D.   On: 02/22/2018 09:20        Scheduled Meds: . aspirin  81 mg Oral BID  . azithromycin  250 mg Oral Daily  . docusate sodium  100 mg Oral BID  . lisinopril  20 mg Oral Daily   And  . hydrochlorothiazide  12.5 mg Oral Daily  . isosorbide mononitrate  60 mg Oral Daily  . polyethylene glycol  17 g Oral Daily  . potassium chloride  40 mEq Oral Once  . simvastatin  20 mg Oral Daily  . sodium chloride flush  3 mL Intravenous Q12H  . sodium phosphate  1 enema Rectal Once  . umeclidinium-vilanterol  1 puff Inhalation Daily   Continuous Infusions: . sodium chloride    . methocarbamol (ROBAXIN) IV       LOS: 3 days    Time spent: over 55m    Lacretia Nicks, MD [facility] Pager [phone]  If 7PM-7AM, please contact night-coverage [email or web address] Password [facility] 02/22/2018, 11:47 AM

## 2018-02-22 NOTE — Care Management Important Message (Signed)
Important Message  Patient Details  Name: [name] MRN: [id number] Date of Birth: October 19, 1945   Medicare Important Message Given:  Yes    Dorena BodoIris Jaycelyn Orrison 02/22/2018, 4:00 PM

## 2018-02-23 LAB — TYPE AND SCREEN
ABO/RH(D): A POS
ANTIBODY SCREEN: POSITIVE
Donor AG Type: NEGATIVE
Donor AG Type: NEGATIVE
Unit division: 0
Unit division: 0

## 2018-02-23 LAB — BPAM RBC
BLOOD PRODUCT EXPIRATION DATE: 201908292359
Blood Product Expiration Date: 201908292359
ISSUE DATE / TIME: 201908050805
UNIT TYPE AND RH: 6200
Unit Type and Rh: 6200

## 2018-02-23 NOTE — Progress Notes (Signed)
Subjective: 4 Days Post-Op Procedure(s) (LRB): LEFT TOTAL HIP ARTHROPLASTY (Left) Patient reports pain as 2 on 0-10 scale.    Objective: Vital signs in last 24 hours: Temp:  [97.8 F (36.6 C)-98.4 F (36.9 C)] 97.9 F (36.6 C) (08/17 0453) Pulse Rate:  [84-100] 84 (08/17 0453) Resp:  [16-18] 16 (08/17 0453) BP: (101-136)/(57-75) 136/75 (08/17 0453) SpO2:  [96 %-100 %] 96 % (08/17 0900)  Intake/Output from previous day: 08/16 0701 - 08/17 0700 In: 480 [P.O.:480] Out: 600 [Urine:600] Intake/Output this shift: Total I/O In: 120 [P.O.:120] Out: -   Recent Labs    02/21/18 0330 02/22/18 0305  HGB 8.9* 8.8*   Recent Labs    02/21/18 0330 02/22/18 0305  WBC 13.7* 13.3*  RBC 3.05* 3.00*  HCT 27.9* 27.6*  PLT 241 242   Recent Labs    02/21/18 0330 02/22/18 0305  NA 141 139  K 3.1* 3.4*  CL 106 103  CO2 28 28  BUN 20 17  CREATININE 0.97 0.96  GLUCOSE 117* 109*  CALCIUM 8.4* 8.4*   No results for input(s): LABPT, INR in the last 72 hours.  Neurologically intact ABD soft Intact pulses distally No cellulitis present Compartment soft    Assessment/Plan: 4 Days Post-Op Procedure(s) (LRB): LEFT TOTAL HIP ARTHROPLASTY (Left) Up with therapy-will plan on sending home-issue with pre cert for rehab and family willing to care for at home. No GI or respiratory issues. Left hip wound clean and dry. No calf pain. Discharge today    Valeria Batmaneter W Desirae Mancusi 02/23/2018, 9:36 AM

## 2018-02-23 NOTE — Plan of Care (Signed)
  Problem: Pain Management: Goal: Pain level will decrease with appropriate interventions Outcome: Progressing    Problem: Safety: Goal: Ability to remain free from injury will improve Outcome: Progressing   Problem: Skin Integrity: Goal: Will show signs of wound healing Outcome: Progressing   Problem: Activity: Goal: Risk for activity intolerance will decrease Outcome: Progressing

## 2018-02-23 NOTE — Clinical Social Work Note (Signed)
Pt will not obtain insurance auth over weekend. Per MD note and RNCM pt will d/c home today. Clinical Social Worker will sign off for now as social work intervention is no longer needed. Please consult us again if new need arises.   Clarisse GougeBridget A Oliviya Gilkison 02/23/2018

## 2018-02-23 NOTE — Care Management Note (Signed)
Case Management Note  Patient Details  Name: [name] Mells MRN: [id number] Date of Birth: 19-Jan-1946  Subjective/Objective:      Pt from home with family for left total hip.  Pt's family wanted him to go to SNF, but insurance authorization not obtained during the week.  Pt wants to go home and MD [facility]  Pt has walker and 3n1 at home and does not feel he needs other DME.  Pt reports his granddaughter is on the way to pick him up now.             Action/Plan: Offered choice of HH agency.  Pt has not used HH in the past and has no preference.  [facility]  [facility]     Expected Discharge Date:  02/23/18               Expected Discharge Plan:  Home w Home Health Services  In-House Referral:  Clinical Social Work  Discharge planning Services  CM Consult  Post Acute Care Choice:  Home Health, Durable Medical Equipment Choice offered to:  Patient  DME Arranged:  N/A DME Agency:  NA  HH Arranged:  PT HH Agency:  [facility]  Status of Service:  Completed, signed off  If discussed at Long Length of Stay Meetings, dates discussed:    Additional Comments:  Deveron Furlongshley  Charidy Cappelletti, RN 02/23/2018, 10:50 AM

## 2018-02-23 NOTE — Progress Notes (Signed)
Pt with stable respiratory status. No additional recommendations. Signing off.

## 2018-02-25 ENCOUNTER — Telehealth (INDEPENDENT_AMBULATORY_CARE_PROVIDER_SITE_OTHER): Payer: Self-pay | Admitting: Orthopaedic Surgery

## 2018-02-25 NOTE — Telephone Encounter (Signed)
PLEASE ADVISE.

## 2018-02-25 NOTE — Telephone Encounter (Signed)
CALLED DEBBIE WITH VERBAL ORDERS

## 2018-02-25 NOTE — Telephone Encounter (Signed)
PT orders for 3w/1 &  2w/2. Please call with verbal.

## 2018-02-25 NOTE — Telephone Encounter (Signed)
OK as written 

## 2018-02-27 ENCOUNTER — Inpatient Hospital Stay (INDEPENDENT_AMBULATORY_CARE_PROVIDER_SITE_OTHER): Payer: Medicare Other | Admitting: Orthopaedic Surgery

## 2018-03-06 ENCOUNTER — Encounter (INDEPENDENT_AMBULATORY_CARE_PROVIDER_SITE_OTHER): Payer: Self-pay | Admitting: Orthopaedic Surgery

## 2018-03-06 ENCOUNTER — Ambulatory Visit (INDEPENDENT_AMBULATORY_CARE_PROVIDER_SITE_OTHER): Payer: Medicare Other | Admitting: Orthopaedic Surgery

## 2018-03-06 VITALS — BP 128/62 | HR 98 | Ht 64.0 in | Wt 183.0 lb

## 2018-03-06 DIAGNOSIS — Z96642 Presence of left artificial hip joint: Secondary | ICD-10-CM

## 2018-03-06 NOTE — Progress Notes (Deleted)
Office Visit Note   Patient: [name]           Date of Birth: [birth year]           MRN: [id number] Visit Date: 03/06/2018              Requested by: Kirstie Peri, MD [id number] [place], [place] [address] PCP: Kirstie Peri, MD   Assessment & Plan: Visit Diagnoses:  1. Status post left hip replacement     Plan: ***  Follow-Up Instructions: No follow-ups on file.   Orders:  No orders of the defined types were placed in this encounter.  No orders of the defined types were placed in this encounter.     Procedures: No procedures performed   Clinical Data: No additional findings.   Subjective: Chief Complaint  Patient presents with  . Follow-up    02/12/18 L HIP SURGERY FOLLOW UP    HPI  Review of Systems  Constitutional: Negative for fatigue and fever.  HENT: Negative for congestion and ear pain.   Eyes: Negative for pain.  Respiratory: Negative for cough and shortness of breath.   Cardiovascular: Negative for leg swelling.  Gastrointestinal: Negative for constipation and diarrhea.  Genitourinary: Negative for difficulty urinating.  Musculoskeletal: Negative for back pain and neck pain.  Skin: Negative for rash.  Allergic/Immunologic: Negative for food allergies.  Neurological: Positive for weakness.  Hematological: Does not bruise/bleed easily.  Psychiatric/Behavioral: Positive for sleep disturbance.     Objective: Vital Signs: There were no vitals taken for this visit.  Physical Exam  Ortho Exam  Specialty Comments:  No specialty comments available.  Imaging: No results found.   PMFS History: Patient Active Problem List   Diagnosis Date Noted  . Primary osteoarthritis of left hip 02/19/2018  . Avascular necrosis of bone of hip, left (HCC) 02/19/2018  . S/P hip replacement, left 02/19/2018  . Unilateral primary osteoarthritis, left hip 01/09/2018  . Atherosclerosis of native arteries of the extremities with intermittent claudication  03/30/2014  . Aftercare following surgery of the circulatory system, NEC 03/30/2014  . PAD (peripheral artery disease) (HCC) 03/30/2014  . Muscle cramps- Right  Flank / Sharyne Peach 03/30/2014  . Paresthesia of right foot 03/30/2014  . Chest pain 12/30/2013  . Unstable angina (HCC) 12/29/2013  . Chest pain at rest 12/29/2013  . Chronic respiratory failure (HCC) 12/29/2013  . COPD (chronic obstructive pulmonary disease) (HCC)   . HTN (hypertension), malignant 10/07/2013  . Dyspnea 03/06/2013  . Tobacco abuse   . HLD (hyperlipidemia)   . Peripheral vascular disease, unspecified (HCC) 03/26/2012  . Coronary atherosclerosis of native coronary artery 07/28/2011  . Mixed hyperlipidemia 07/28/2011  . Essential hypertension, benign 07/28/2011  . Peripheral arterial disease (HCC) 07/28/2011  . Carotid artery disease (HCC) 07/28/2011   Past Medical History:  Diagnosis Date  . Asthma   . Carotid artery disease (HCC)    50-69% RICA and less than 50% LICA, 07/2011  . COPD (chronic obstructive pulmonary disease) (HCC)   . Coronary atherosclerosis of native coronary artery    Based on abnormal Myoview - inferior scar, 05/2011; LVEF 45-50%, echo, 07/2011  . Essential hypertension, benign   . HLD (hyperlipidemia)   . NSTEMI (non-ST elevated myocardial infarction) (HCC)    12/12, minor  . On home oxygen therapy    4Lnc with exertion  . Peripheral arterial disease (HCC)   . Tobacco abuse     Family History  Problem Relation Age of Onset  .  Heart attack Father   . Heart attack Mother     Past Surgical History:  Procedure Laterality Date  . ABDOMINAL SURGERY     2008  . COLONOSCOPY    . Right axillary to femoral bypass  3/12   Dr. Hart RochesterLawson  . RIGHT HYDROCELE REPAIR    . Right to left femoral to femoral bypass  3/12   Dr. Hart RochesterLawson  . SIGMOID RESECTION / RECTOPEXY  2005  . skin abscess drainage    . TOTAL HIP ARTHROPLASTY Left 02/19/2018   Procedure: LEFT TOTAL HIP ARTHROPLASTY;  Surgeon:  Valeria BatmanWhitfield, Peter W, MD;  Location: [facility] OR;  Service: Orthopedics;  Laterality: Left;   Social History   Occupational History  . Not on file  Tobacco Use  . Smoking status: Former Smoker    Packs/day: 2.00    Years: 55.00    Pack years: 110.00    Types: Cigarettes    Start date: 12/03/1959    Last attempt to quit: 12/09/2010    Years since quitting: 7.2  . Smokeless tobacco: Never Used  . Tobacco comment: Patient has not somked since midnight on wednesday.   Substance and Sexual Activity  . Alcohol use: No    Alcohol/week: 0.0 standard drinks  . Drug use: No  . Sexual activity: Not on file

## 2018-03-06 NOTE — Progress Notes (Signed)
Office Visit Note   Patient: [name]           Date of Birth: [birth year]           MRN: [id number] Visit Date: 03/06/2018              Requested by: Kirstie Peri, MD [address] [place], [place] [address] PCP: Kirstie Peri, MD   Assessment & Plan: Visit Diagnoses:  1. Status post left hip replacement     Plan: Doing well 2 weeks status post left hip replacement would like to go home from rehab.  Still using a walker.  Very minimal pain complications.  Staples removed from left hip and Steri-Strips applied.  Office 2 weeks.  Continue with aspirin 81 mg twice a day.  From my  standpoint he is fine to be released from the rehab and go home with his daughters  Follow-Up Instructions: Return in about 2 weeks (around 03/20/2018).   Orders:  No orders of the defined types were placed in this encounter.  No orders of the defined types were placed in this encounter.     Procedures: No procedures performed   Clinical Data: No additional findings.   Subjective: Chief Complaint  Patient presents with  . Follow-up    02/19/18 L TOTAL HIP SURGERY FOLLOW UP  [name] is 2 weeks status post primary left total hip replacement.  Doing very well.  Had some issues with his breathing postoperatively but doing well at the present time.  At rehab in the ED.  Using a walker and relates she does not have any the pain that he had preoperatively.  Postoperative films in recovery room several weeks ago revealed excellent position of the components.  HPI  Review of Systems   Objective: Vital Signs: Ht 5\' 4"  (1.626 m)   Wt 183 lb (83 kg)   BMI 31.41 kg/m   Physical Exam  Ortho Exam alert and oriented x3.  Presently using nasal oxygen.  Good sensibility to left foot and normal motor exam.  Left hip incision healing without problem.  Clips removed and Steri-Strips applied.  Specialty Comments:  No specialty comments available.  Imaging: No results found.   PMFS History: Patient  Active Problem List   Diagnosis Date Noted  . Primary osteoarthritis of left hip 02/19/2018  . Avascular necrosis of bone of hip, left (HCC) 02/19/2018  . S/P hip replacement, left 02/19/2018  . Unilateral primary osteoarthritis, left hip 01/09/2018  . Atherosclerosis of native arteries of the extremities with intermittent claudication 03/30/2014  . Aftercare following surgery of the circulatory system, NEC 03/30/2014  . PAD (peripheral artery disease) (HCC) 03/30/2014  . Muscle cramps- Right  Flank / Sharyne Peach 03/30/2014  . Paresthesia of right foot 03/30/2014  . Chest pain 12/30/2013  . Unstable angina (HCC) 12/29/2013  . Chest pain at rest 12/29/2013  . Chronic respiratory failure (HCC) 12/29/2013  . COPD (chronic obstructive pulmonary disease) (HCC)   . HTN (hypertension), malignant 10/07/2013  . Dyspnea 03/06/2013  . Tobacco abuse   . HLD (hyperlipidemia)   . Peripheral vascular disease, unspecified (HCC) 03/26/2012  . Coronary atherosclerosis of native coronary artery 07/28/2011  . Mixed hyperlipidemia 07/28/2011  . Essential hypertension, benign 07/28/2011  . Peripheral arterial disease (HCC) 07/28/2011  . Carotid artery disease (HCC) 07/28/2011   Past Medical History:  Diagnosis Date  . Asthma   . Carotid artery disease (HCC)    50-69% RICA and less than 50% LICA, 07/2011  .  COPD (chronic obstructive pulmonary disease) (HCC)   . Coronary atherosclerosis of native coronary artery    Based on abnormal Myoview - inferior scar, 05/2011; LVEF 45-50%, echo, 07/2011  . Essential hypertension, benign   . HLD (hyperlipidemia)   . NSTEMI (non-ST elevated myocardial infarction) (HCC)    12/12, minor  . On home oxygen therapy    4Lnc with exertion  . Peripheral arterial disease (HCC)   . Tobacco abuse     Family History  Problem Relation Age of Onset  . Heart attack Father   . Heart attack Mother     Past Surgical History:  Procedure Laterality Date  . ABDOMINAL SURGERY      2008  . COLONOSCOPY    . Right axillary to femoral bypass  3/12   Dr. Hart RochesterLawson  . RIGHT HYDROCELE REPAIR    . Right to left femoral to femoral bypass  3/12   Dr. Hart RochesterLawson  . SIGMOID RESECTION / RECTOPEXY  2005  . skin abscess drainage    . TOTAL HIP ARTHROPLASTY Left 02/19/2018   Procedure: LEFT TOTAL HIP ARTHROPLASTY;  Surgeon: Valeria BatmanWhitfield, Rivers Gassmann W, MD;  Location: [facility] OR;  Service: Orthopedics;  Laterality: Left;   Social History   Occupational History  . Not on file  Tobacco Use  . Smoking status: Former Smoker    Packs/day: 2.00    Years: 55.00    Pack years: 110.00    Types: Cigarettes    Start date: 12/03/1959    Last attempt to quit: 12/09/2010    Years since quitting: 7.2  . Smokeless tobacco: Never Used  . Tobacco comment: Patient has not somked since midnight on wednesday.   Substance and Sexual Activity  . Alcohol use: No    Alcohol/week: 0.0 standard drinks  . Drug use: No  . Sexual activity: Not on file     Valeria BatmanPeter W Journe Hallmark, MD   Note - This record has been created using [employer] software.  Chart creation errors have been sought, but may not always  have been located. Such creation errors do not reflect on  the standard of medical care.

## 2018-03-18 ENCOUNTER — Ambulatory Visit: Payer: Medicare Other | Admitting: Cardiovascular Disease

## 2018-03-19 ENCOUNTER — Encounter: Payer: Self-pay | Admitting: Cardiovascular Disease

## 2018-03-20 ENCOUNTER — Encounter (INDEPENDENT_AMBULATORY_CARE_PROVIDER_SITE_OTHER): Payer: Self-pay | Admitting: Orthopaedic Surgery

## 2018-03-20 ENCOUNTER — Ambulatory Visit (INDEPENDENT_AMBULATORY_CARE_PROVIDER_SITE_OTHER): Payer: Medicare Other | Admitting: Orthopaedic Surgery

## 2018-03-20 VITALS — BP 101/54 | HR 107 | Ht 64.0 in | Wt 183.0 lb

## 2018-03-20 DIAGNOSIS — Z96642 Presence of left artificial hip joint: Secondary | ICD-10-CM

## 2018-03-20 NOTE — Progress Notes (Signed)
Office Visit Note   Patient: [name]           Date of Birth: 1945-11-21           MRN: [id number] Visit Date: 03/20/2018              Requested by: Kirstie Peri, MD [address] [place], [place] [address] PCP: Kirstie Peri, MD   Assessment & Plan: Visit Diagnoses:  1. Status post left hip replacement     Plan: 1 month s/p left THR and "no Pain". Still at rehab facility with respiratory issues. Needs to be ambulating for strengthening. Office 1 month  Follow-Up Instructions: Return in about 1 month (around 04/19/2018).   Orders:  No orders of the defined types were placed in this encounter.  No orders of the defined types were placed in this encounter.     Procedures: No procedures performed   Clinical Data: No additional findings.   Subjective: Chief Complaint  Patient presents with  . Follow-up    1 MO FOLLOW UP NO  ISSUES  still at rehab based on respiratory problems and weakness. Happy with left hip. "first time in years that I do not have pain"  HPI  Review of Systems  Constitutional: Negative for fatigue and fever.  HENT: Negative for ear pain.   Eyes: Negative for pain.  Respiratory: Negative for cough and shortness of breath.   Cardiovascular: Negative for leg swelling.  Gastrointestinal: Negative for constipation and diarrhea.  Genitourinary: Negative for difficulty urinating.  Musculoskeletal: Negative for back pain and neck pain.  Skin: Negative for rash.  Allergic/Immunologic: Negative for food allergies.  Neurological: Positive for weakness. Negative for numbness.  Hematological: Does not bruise/bleed easily.  Psychiatric/Behavioral: Negative for sleep disturbance.     Objective: Vital Signs: BP (!) 101/54 (BP Location: Left Arm, Patient Position: Sitting, Cuff Size: Normal)   Pulse (!) 107   Ht 5\' 4"  (1.626 m)   Wt 183 lb (83 kg)   BMI 31.41 kg/m   Physical Exam  Ortho Examleft hip incision healing without problem. Painless ROM  left hip. No edema  Specialty Comments:  No specialty comments available.  Imaging: No results found.   PMFS History: Patient Active Problem List   Diagnosis Date Noted  . Primary osteoarthritis of left hip 02/19/2018  . Avascular necrosis of bone of hip, left (HCC) 02/19/2018  . S/P hip replacement, left 02/19/2018  . Unilateral primary osteoarthritis, left hip 01/09/2018  . Atherosclerosis of native arteries of the extremities with intermittent claudication 03/30/2014  . Aftercare following surgery of the circulatory system, NEC 03/30/2014  . PAD (peripheral artery disease) (HCC) 03/30/2014  . Muscle cramps- Right  Flank / Sharyne Peach 03/30/2014  . Paresthesia of right foot 03/30/2014  . Chest pain 12/30/2013  . Unstable angina (HCC) 12/29/2013  . Chest pain at rest 12/29/2013  . Chronic respiratory failure (HCC) 12/29/2013  . COPD (chronic obstructive pulmonary disease) (HCC)   . HTN (hypertension), malignant 10/07/2013  . Dyspnea 03/06/2013  . Tobacco abuse   . HLD (hyperlipidemia)   . Peripheral vascular disease, unspecified (HCC) 03/26/2012  . Coronary atherosclerosis of native coronary artery 07/28/2011  . Mixed hyperlipidemia 07/28/2011  . Essential hypertension, benign 07/28/2011  . Peripheral arterial disease (HCC) 07/28/2011  . Carotid artery disease (HCC) 07/28/2011   Past Medical History:  Diagnosis Date  . Asthma   . Carotid artery disease (HCC)    50-69% RICA and less than 50% LICA, 07/2011  .  COPD (chronic obstructive pulmonary disease) (HCC)   . Coronary atherosclerosis of native coronary artery    Based on abnormal Myoview - inferior scar, 05/2011; LVEF 45-50%, echo, 07/2011  . Essential hypertension, benign   . HLD (hyperlipidemia)   . NSTEMI (non-ST elevated myocardial infarction) (HCC)    12/12, minor  . On home oxygen therapy    4Lnc with exertion  . Peripheral arterial disease (HCC)   . Tobacco abuse     Family History  Problem Relation Age of  Onset  . Heart attack Father   . Heart attack Mother     Past Surgical History:  Procedure Laterality Date  . ABDOMINAL SURGERY     2008  . COLONOSCOPY    . Right axillary to femoral bypass  3/12   Dr. Hart Rochester  . RIGHT HYDROCELE REPAIR    . Right to left femoral to femoral bypass  3/12   Dr. Hart Rochester  . SIGMOID RESECTION / RECTOPEXY  2005  . skin abscess drainage    . TOTAL HIP ARTHROPLASTY Left 02/19/2018   Procedure: LEFT TOTAL HIP ARTHROPLASTY;  Surgeon: Valeria Batman, MD;  Location: [facility] OR;  Service: Orthopedics;  Laterality: Left;   Social History   Occupational History  . Not on file  Tobacco Use  . Smoking status: Former Smoker    Packs/day: 2.00    Years: 55.00    Pack years: 110.00    Types: Cigarettes    Start date: 12/03/1959    Last attempt to quit: 12/09/2010    Years since quitting: 7.2  . Smokeless tobacco: Never Used  . Tobacco comment: Patient has not somked since midnight on wednesday.   Substance and Sexual Activity  . Alcohol use: No    Alcohol/week: 0.0 standard drinks  . Drug use: No  . Sexual activity: Not on file

## 2018-03-21 ENCOUNTER — Telehealth (INDEPENDENT_AMBULATORY_CARE_PROVIDER_SITE_OTHER): Payer: Self-pay | Admitting: Orthopaedic Surgery

## 2018-03-21 NOTE — Telephone Encounter (Signed)
Brandy from [facility] called requesting a return call regarding patient's Aspirin dosage.  Please call her direct line #[phone]

## 2018-03-21 NOTE — Telephone Encounter (Signed)
Spoke with [name] and gave order to her because Gearldine BienenstockBrandy was out of the office

## 2018-03-21 NOTE — Telephone Encounter (Signed)
81 mg po daily

## 2018-03-21 NOTE — Telephone Encounter (Signed)
PLEASE ADVISE.

## 2018-04-17 ENCOUNTER — Encounter (INDEPENDENT_AMBULATORY_CARE_PROVIDER_SITE_OTHER): Payer: Self-pay | Admitting: Orthopaedic Surgery

## 2018-04-17 ENCOUNTER — Ambulatory Visit (INDEPENDENT_AMBULATORY_CARE_PROVIDER_SITE_OTHER): Payer: Medicare Other | Admitting: Orthopaedic Surgery

## 2018-04-17 VITALS — BP 70/44 | HR 72 | Ht 64.0 in | Wt 180.0 lb

## 2018-04-17 DIAGNOSIS — Z96642 Presence of left artificial hip joint: Secondary | ICD-10-CM

## 2018-04-17 NOTE — Progress Notes (Signed)
Office Visit Note   Patient: [name]           Date of Birth: 1945/12/10           MRN: [id number] Visit Date: 04/17/2018              Requested by: Kirstie Peri, MD [address] [place], [place] [address] PCP: Kirstie Peri, MD   Assessment & Plan: Visit Diagnoses:  1. Status post left hip replacement     Plan:  2 months s/p left THR-no pain, Still having some chronic breathing issues. Office 3 months  Follow-Up Instructions: Return in about 3 months (around 07/18/2018).   Orders:  No orders of the defined types were placed in this encounter.  No orders of the defined types were placed in this encounter.     Procedures: No procedures performed   Clinical Data: No additional findings.   Subjective: No chief complaint on file. very happy with left THR-no pain, no issues. Still having some breathing problems predating surgery. At home  HPI  Review of Systems   Objective: Vital Signs: There were no vitals taken for this visit.  Physical Exam  Ortho Examno pain with ROM left hip-n/v intact  Specialty Comments:  No specialty comments available.  Imaging: No results found.   PMFS History: Patient Active Problem List   Diagnosis Date Noted  . Primary osteoarthritis of left hip 02/19/2018  . Avascular necrosis of bone of hip, left (HCC) 02/19/2018  . S/P hip replacement, left 02/19/2018  . Unilateral primary osteoarthritis, left hip 01/09/2018  . Atherosclerosis of native arteries of the extremities with intermittent claudication 03/30/2014  . Aftercare following surgery of the circulatory system, NEC 03/30/2014  . PAD (peripheral artery disease) (HCC) 03/30/2014  . Muscle cramps- Right  Flank / Sharyne Peach 03/30/2014  . Paresthesia of right foot 03/30/2014  . Chest pain 12/30/2013  . Unstable angina (HCC) 12/29/2013  . Chest pain at rest 12/29/2013  . Chronic respiratory failure (HCC) 12/29/2013  . COPD (chronic obstructive pulmonary disease) (HCC)   . HTN  (hypertension), malignant 10/07/2013  . Dyspnea 03/06/2013  . Tobacco abuse   . HLD (hyperlipidemia)   . Peripheral vascular disease, unspecified (HCC) 03/26/2012  . Coronary atherosclerosis of native coronary artery 07/28/2011  . Mixed hyperlipidemia 07/28/2011  . Essential hypertension, benign 07/28/2011  . Peripheral arterial disease (HCC) 07/28/2011  . Carotid artery disease (HCC) 07/28/2011   Past Medical History:  Diagnosis Date  . Asthma   . Carotid artery disease (HCC)    50-69% RICA and less than 50% LICA, 07/2011  . COPD (chronic obstructive pulmonary disease) (HCC)   . Coronary atherosclerosis of native coronary artery    Based on abnormal Myoview - inferior scar, 05/2011; LVEF 45-50%, echo, 07/2011  . Essential hypertension, benign   . HLD (hyperlipidemia)   . NSTEMI (non-ST elevated myocardial infarction) (HCC)    12/12, minor  . On home oxygen therapy    4Lnc with exertion  . Peripheral arterial disease (HCC)   . Tobacco abuse     Family History  Problem Relation Age of Onset  . Heart attack Father   . Heart attack Mother     Past Surgical History:  Procedure Laterality Date  . ABDOMINAL SURGERY     2008  . COLONOSCOPY    . Right axillary to femoral bypass  3/12   Dr. Hart Rochester  . RIGHT HYDROCELE REPAIR    . Right to left femoral to femoral  bypass  3/12   Dr. Hart Rochester  . SIGMOID RESECTION / RECTOPEXY  2005  . skin abscess drainage    . TOTAL HIP ARTHROPLASTY Left 02/19/2018   Procedure: LEFT TOTAL HIP ARTHROPLASTY;  Surgeon: Valeria Batman, MD;  Location: [facility] OR;  Service: Orthopedics;  Laterality: Left;   Social History   Occupational History  . Not on file  Tobacco Use  . Smoking status: Former Smoker    Packs/day: 2.00    Years: 55.00    Pack years: 110.00    Types: Cigarettes    Start date: 12/03/1959    Last attempt to quit: 12/09/2010    Years since quitting: 7.3  . Smokeless tobacco: Never Used  . Tobacco comment: Patient has not somked since  midnight on wednesday.   Substance and Sexual Activity  . Alcohol use: No    Alcohol/week: 0.0 standard drinks  . Drug use: No  . Sexual activity: Not on file     Valeria Batman, MD   Note - This record has been created using [employer].  Chart creation errors have been sought, but may not always  have been located. Such creation errors do not reflect on  the standard of medical care.

## 2018-05-14 ENCOUNTER — Telehealth (INDEPENDENT_AMBULATORY_CARE_PROVIDER_SITE_OTHER): Payer: Self-pay | Admitting: Orthopaedic Surgery

## 2018-05-14 NOTE — Telephone Encounter (Signed)
Will complete surgery sheet

## 2018-05-14 NOTE — Telephone Encounter (Signed)
Waiting on new clearance sheet faxed from his drs

## 2018-05-14 NOTE — Telephone Encounter (Signed)
Please advise 

## 2018-05-14 NOTE — Telephone Encounter (Signed)
Patient's daughter [name] called stating that [name] wants to discuss scheduling hip replacement surgery on his right side now. [name] states that he is in a lot of pain on the right side and Dr. Cleophas Dunker told him to contact the office when he was ready to schedule surgery on the other hip.

## 2018-05-27 ENCOUNTER — Telehealth (INDEPENDENT_AMBULATORY_CARE_PROVIDER_SITE_OTHER): Payer: Self-pay | Admitting: Orthopaedic Surgery

## 2018-05-27 ENCOUNTER — Telehealth: Payer: Self-pay | Admitting: Pulmonary Disease

## 2018-05-27 ENCOUNTER — Telehealth: Payer: Self-pay | Admitting: Cardiovascular Disease

## 2018-05-27 NOTE — Telephone Encounter (Signed)
Patient's daughter called returning Roxy's call with the following information: Dr. Cyril MourningAlva Rakesh Fax #[phone] and Dr. Purvis SheffieldKoneswaran Fax #[phone]

## 2018-05-27 NOTE — Telephone Encounter (Signed)
Appt w/Dr. Cyril Mourningakesh Alva 05/30/18 for surgery clearance - see patient's  Chart.

## 2018-05-27 NOTE — Telephone Encounter (Signed)
Patient's daughter called stating that she spoke with Dr. Purvis SheffieldKoneswaran (cardiologist) and Dr. Kathy Breachakesh (pulmonologist) who state they have not received a clearance form from Dr. Cleophas DunkerWhitfield to fill out.  Patient is very eager to schedule surgery.

## 2018-05-27 NOTE — Telephone Encounter (Signed)
Patient asking if clearance has been sent for her father.  He is in need of hip replacement  and Dr Cleophas DunkerWhitfield was suppose to be [place].  Please contact patient and advise

## 2018-05-27 NOTE — Telephone Encounter (Signed)
Right hip is now to be done.Per Eunice Blaseebbie at Dr Hoy RegisterWhitfield's office, they received cardiac clearance from fax [phone] [facility]     I will let daughter [name] know

## 2018-05-27 NOTE — Telephone Encounter (Signed)
Paper work obtained from [employer] nurse. Per RA patient needs a office visit for surgical clearance. Called and spoke to patient daughter Benetta SparKrissy made aware patient will need a face to face. Appointment made for 11.21.19 at 11:15. New office address and number given. Nothing further needed at this time.

## 2018-05-27 NOTE — Telephone Encounter (Signed)
FAXED CLEARANCE SHEET AND NOTIFIED PT.

## 2018-05-30 ENCOUNTER — Ambulatory Visit (INDEPENDENT_AMBULATORY_CARE_PROVIDER_SITE_OTHER)
Admission: RE | Admit: 2018-05-30 | Discharge: 2018-05-30 | Disposition: A | Discharge status: Home / Self Care | Payer: Medicare Other | Source: Ambulatory Visit | Attending: Primary Care | Admitting: Primary Care

## 2018-05-30 ENCOUNTER — Ambulatory Visit (INDEPENDENT_AMBULATORY_CARE_PROVIDER_SITE_OTHER): Payer: Medicare Other | Admitting: Primary Care

## 2018-05-30 ENCOUNTER — Encounter: Payer: Self-pay | Admitting: Primary Care

## 2018-05-30 VITALS — BP 130/76 | HR 90 | Temp 97.7°F | Ht 64.0 in | Wt 184.8 lb

## 2018-05-30 DIAGNOSIS — J418 Mixed simple and mucopurulent chronic bronchitis: Secondary | ICD-10-CM

## 2018-05-30 DIAGNOSIS — J449 Chronic obstructive pulmonary disease, unspecified: Secondary | ICD-10-CM | POA: Diagnosis not present

## 2018-05-30 LAB — CBC WITH DIFFERENTIAL/PLATELET
BASOS PCT: 0.9 % (ref 0.0–3.0)
Basophils Absolute: 0.1 10*3/uL (ref 0.0–0.1)
EOS PCT: 1.8 % (ref 0.0–5.0)
Eosinophils Absolute: 0.2 10*3/uL (ref 0.0–0.7)
HEMATOCRIT: 35 % — AB (ref 39.0–52.0)
Hemoglobin: 11.4 g/dL — ABNORMAL LOW (ref 13.0–17.0)
Lymphocytes Relative: 9.7 % — ABNORMAL LOW (ref 12.0–46.0)
Lymphs Abs: 1.3 10*3/uL (ref 0.7–4.0)
MCHC: 32.5 g/dL (ref 30.0–36.0)
MCV: 84.1 fl (ref 78.0–100.0)
MONO ABS: 0.9 10*3/uL (ref 0.1–1.0)
Monocytes Relative: 6.5 % (ref 3.0–12.0)
NEUTROS ABS: 10.8 10*3/uL — AB (ref 1.4–7.7)
Neutrophils Relative %: 81.1 % — ABNORMAL HIGH (ref 43.0–77.0)
PLATELETS: 265 10*3/uL (ref 150.0–400.0)
RBC: 4.16 Mil/uL — ABNORMAL LOW (ref 4.22–5.81)
RDW: 16 % — AB (ref 11.5–15.5)
WBC: 13.3 10*3/uL — ABNORMAL HIGH (ref 4.0–10.5)

## 2018-05-30 LAB — BASIC METABOLIC PANEL
BUN: 17 mg/dL (ref 6–23)
CHLORIDE: 106 meq/L (ref 96–112)
CO2: 27 meq/L (ref 19–32)
Calcium: 9.2 mg/dL (ref 8.4–10.5)
Creatinine, Ser: 1.02 mg/dL (ref 0.40–1.50)
GFR: 75.99 mL/min (ref >60.00)
GLUCOSE: 111 mg/dL — AB (ref 70–99)
Potassium: 3.5 mEq/L (ref 3.5–5.1)
Sodium: 142 mEq/L (ref 135–145)

## 2018-05-30 MED ORDER — PREDNISONE 10 MG PO TABS: ORAL_TABLET | ORAL | 0 refills | Status: DC

## 2018-05-30 MED ORDER — DOXYCYCLINE HYCLATE 100 MG PO TABS: 100.0000 mg | ORAL_TABLET | Freq: Two times a day (BID) | ORAL | 0 refills | Status: DC

## 2018-05-30 NOTE — Progress Notes (Addendum)
@Patient  ID: [name], male    DOB: 03-27-1946, 72 y.o.   MRN: [id number]  Chief Complaint  Patient presents with  . Follow-up    need surgical clearance    Referring provider: Kirstie Peri, MD  HPI: 72 year old male, former smoker quit in 2012 (55-pack-year history).  Medical history significant for chronic respiratory failure, COPD, CAD, hypertension.  Patient of Dr. Vassie Loll, last seen in June 2019 for surgical clearance.  He was on Advair and Symbicort for many years, this was changed to Anoro in about 2017.  Rare use of rescue inhaler.  Chronic cough and throat clearing felt possibly related to lisinopril.    Dobutamine stress test in 2015 did not show any signs of ischemia, suggested inferior wall scarring.  Echocardiogram in 2015 showed normal LV function. Spirometry done in June 2019 which showed moderate airway obstruction with a ratio of 49, FEV1 of 66% and FVC of 96%  05/31/2018 Patient presents today for surgical clearance for RIGHT total hip replacement with [facility]. Accompanied by daughter and his grandson. He succesfully underwent surgery for LEFT total hip arthroplasty with Dr. Cleophas Dunker back in August 2019. Reports that he feels in his normal health today. He does complain of shortness of breath, wheezing and cough with white mucus. He is oxygen dependent at home, uses 4L. He did not bring oxygen today. We need to check labs and CXR.    No Known Allergies  Immunization History  Administered Date(s) Administered  . Influenza,inj,quad, With Preservative 04/23/2017  . Pneumococcal Polysaccharide-23 12/30/2013, 04/23/2017    Past Medical History:  Diagnosis Date  . Asthma   . Carotid artery disease (HCC)    50-69% RICA and less than 50% LICA, 07/2011  . COPD (chronic obstructive pulmonary disease) (HCC)   . Coronary atherosclerosis of native coronary artery    Based on abnormal Myoview - inferior scar, 05/2011; LVEF 45-50%, echo, 07/2011  . Essential  hypertension, benign   . HLD (hyperlipidemia)   . NSTEMI (non-ST elevated myocardial infarction) (HCC)    12/12, minor  . On home oxygen therapy    4Lnc with exertion  . Peripheral arterial disease (HCC)   . Tobacco abuse     Tobacco History: Social History   Tobacco Use  Smoking Status Former Smoker  . Packs/day: 2.00  . Years: 55.00  . Pack years: 110.00  . Types: Cigarettes  . Start date: 12/03/1959  . Last attempt to quit: 12/09/2010  . Years since quitting: 7.4  Smokeless Tobacco Never Used  Tobacco Comment   Patient has not somked since midnight on wednesday.    Counseling given: Not Answered Comment: Patient has not somked since midnight on wednesday.    Outpatient Medications Prior to Visit  Medication Sig Dispense Refill  . albuterol (PROAIR HFA) 108 (90 BASE) MCG/ACT inhaler Inhale 2 puffs into the lungs every 6 (six) hours as needed for shortness of breath.     Marland Kitchen albuterol (PROVENTIL) (2.5 MG/3ML) 0.083% nebulizer solution Take 2.5 mg by nebulization every 6 (six) hours as needed for shortness of breath.     Marland Kitchen aspirin 81 MG chewable tablet Chew 1 tablet (81 mg total) by mouth 2 (two) times daily.    Marland Kitchen HYDROcodone-acetaminophen (NORCO/VICODIN) 5-325 MG tablet Take 1 tablet by mouth daily as needed.  0  . lisinopril-hydrochlorothiazide (PRINZIDE,ZESTORETIC) 20-12.5 MG tablet Take 1 tablet by mouth daily.    . nitroGLYCERIN (NITROSTAT) 0.4 MG SL tablet Place 1 tablet (0.4 mg total) under  the tongue every 5 (five) minutes x 3 doses as needed for chest pain. 25 tablet 3  . oxymetazoline (AFRIN) 0.05 % nasal spray Place 1 spray into both nostrils as needed for congestion.    . simvastatin (ZOCOR) 20 MG tablet TAKE 1 TABLET BY MOUTH EVERY DAY (Patient taking differently: Take 20 mg by mouth daily at 6 PM. ) 10 tablet 0  . tetrahydrozoline (VISINE) 0.05 % ophthalmic solution     . umeclidinium-vilanterol (ANORO ELLIPTA) 62.5-25 MCG/INH AEPB Inhale 1 puff daily into the lungs.     Marland Kitchen. azithromycin (ZITHROMAX) 250 MG tablet Daily 6 each 0  . hydrochlorothiazide (HYDRODIURIL) 25 MG tablet Take 1 tablet (25 mg total) by mouth daily. 7 tablet 0  . oxyCODONE (OXY IR/ROXICODONE) 5 MG immediate release tablet Take 1-2 tablets (5-10 mg total) by mouth every 4 (four) hours as needed for moderate pain (pain score 4-6). 30 tablet 0  . isosorbide mononitrate (IMDUR) 60 MG 24 hr tablet Take 1 tablet (60 mg total) by mouth daily. 90 tablet 3   No facility-administered medications prior to visit.     Review of Systems  Review of Systems  Constitutional: Negative.   HENT: Positive for congestion.   Respiratory: Positive for cough, shortness of breath and wheezing.   Cardiovascular: Negative.   Musculoskeletal:       Chronic left hip pain    Physical Exam  BP 130/76 (BP Location: Left Arm, Cuff Size: Normal)   Pulse 90   Temp 97.7 F (36.5 C)   Ht 5\' 4"  (1.626 m)   Wt 184 lb 12.8 oz (83.8 kg)   SpO2 95%   BMI 31.72 kg/m     Physical Exam  Cardiovascular: Normal rate and regular rhythm.  Pulmonary/Chest:  Lung sounds congested, scattered rhnochi t/o Some dyspnea O2 sat 89% RA     Lab Results:  CBC    Component Value Date/Time   WBC 13.3 (H) 05/30/2018 1211   RBC 4.16 (L) 05/30/2018 1211   HGB 11.4 (L) 05/30/2018 1211   HCT 35.0 (L) 05/30/2018 1211   PLT 265.0 05/30/2018 1211   MCV 84.1 05/30/2018 1211   MCH 29.3 02/22/2018 0305   MCHC 32.5 05/30/2018 1211   RDW 16.0 (H) 05/30/2018 1211   LYMPHSABS 1.3 05/30/2018 1211   MONOABS 0.9 05/30/2018 1211   EOSABS 0.2 05/30/2018 1211   BASOSABS 0.1 05/30/2018 1211    BMET    Component Value Date/Time   NA 142 05/30/2018 1211   K 3.5 05/30/2018 1211   CL 106 05/30/2018 1211   CO2 27 05/30/2018 1211   GLUCOSE 111 (H) 05/30/2018 1211   BUN 17 05/30/2018 1211   CREATININE 1.02 05/30/2018 1211   CREATININE 0.90 05/21/2013 0704   CALCIUM 9.2 05/30/2018 1211   GFRNONAA >60 02/22/2018 0305   GFRAA >60  02/22/2018 0305    BNP No results found for: BNP  ProBNP No results found for: PROBNP  Imaging: Dg Chest 2 View  Result Date: 05/30/2018 CLINICAL DATA:  Routine. No complaints. Hx of asthma, COPD, HTN. Ex smoker. EXAM: CHEST - 2 VIEW COMPARISON:  02/21/2018 FINDINGS: Heart size is normal. Hyperinflation and paucity of lung markings in the apices, consistent with emphysema. There is chronic atelectasis or scarring at the lung bases. No focal consolidation or pleural effusion. No pulmonary edema. There is atherosclerotic calcification of the thoracic aorta. Stable, a numerous UPPER thoracic wedge compression fractures. IMPRESSION: 1. Emphysema. 2.  No evidence for acute  abnormality. 3.  Aortic atherosclerosis.  (ICD10-I70.0) 4. Thoracic wedge compression fractures. Electronically Signed   By: Norva Pavlov M.D.   On: 05/30/2018 12:49     Assessment & Plan:   COPD (chronic obstructive pulmonary disease) (HCC) - Acute COPD exacerbation today, lung sounds very congested with some shortness of breath. Not on oxygen today.  - CXR showed chronic atelectasis or scarring at the lung bases. No focal consolidation or pleural effusion. No pulmonary edema.  - Needs RX for Doxycyline and prednisone taper  - Add lutter valve and take Mucinex twice daily for congestion - Continue Anoro 1 puff daily - Please make sure to wear your oxygen at all times especially on exertion and when out of the house - Unable to clear you for surgery today - Please return in 7 to 10 days for follow-up visit and preop clearance     Glenford Bayley, NP 05/31/2018

## 2018-05-30 NOTE — Patient Instructions (Addendum)
Orders: Chest x-ray today and labs today (will call with results by the end of the day)  COPD exacerbation: - After CXR reviewed, will send in antibiotic and prednisone taper  - Needs flutter valve- use three times a day as needed for congestion  - Please take Mucinex twice daily for congestion  COPD maintenance: - Continue Anoro 1 puff daily - Please make sure to wear your oxygen at all times especially on exertion and when out of the house  Follow-up: Unable to clear you for surgery today Please return in 7 to 10 days for follow-up visit and preop clearance

## 2018-05-31 ENCOUNTER — Encounter: Payer: Self-pay | Admitting: Primary Care

## 2018-05-31 NOTE — Assessment & Plan Note (Addendum)
-   Acute COPD exacerbation today, lung sounds very congested with some shortness of breath. Not on oxygen today.  - CXR showed chronic atelectasis or scarring at the lung bases. No focal consolidation or pleural effusion. No pulmonary edema.  - Needs RX for Doxycyline and prednisone taper  - Add flutter valve and Mucinex twice daily for congestion - Continue Anoro 1 puff daily - Please make sure to wear your oxygen at all times especially on exertion and when out of the house - Unable to clear you for surgery today - Please return in 7 to 10 days for follow-up visit and preop clearance

## 2018-06-07 ENCOUNTER — Ambulatory Visit (INDEPENDENT_AMBULATORY_CARE_PROVIDER_SITE_OTHER): Payer: Medicare Other | Admitting: Primary Care

## 2018-06-07 ENCOUNTER — Encounter: Payer: Self-pay | Admitting: Primary Care

## 2018-06-07 VITALS — BP 124/72 | HR 96 | Ht 65.0 in | Wt 188.0 lb

## 2018-06-07 DIAGNOSIS — Z01818 Encounter for other preprocedural examination: Secondary | ICD-10-CM | POA: Insufficient documentation

## 2018-06-07 DIAGNOSIS — J449 Chronic obstructive pulmonary disease, unspecified: Secondary | ICD-10-CM | POA: Diagnosis not present

## 2018-06-07 NOTE — Assessment & Plan Note (Addendum)
-   Stable, continues 3-4 L oxygen  - O2 sat 92% today  - No resp distress or labored breathing

## 2018-06-07 NOTE — Assessment & Plan Note (Deleted)
-   Cleared from pulmonary standpoint for RIGHT hip surgery with Dr. Cleophas DunkerWhitfield   - Major Pulmonary risks identified in the multifactorial risk analysis are but not limited to a) pneumonia; b) recurrent intubation risk; c) prolonged or recurrent acute respiratory failure needing mechanical ventilation; d) prolonged hospitalization; e) DVT/Pulmonary embolism; f) Acute Pulmonary edema  Recommend 1. Short duration of surgery as much as possible and avoid paralytic if possible 2. Recovery in step down or ICU with Pulmonary consultation 3. DVT prophylaxis 4. Aggressive pulmonary toilet with o2, bronchodilatation, and incentive spirometry and early ambulation

## 2018-06-07 NOTE — Assessment & Plan Note (Addendum)
-   COPD exacerbation improved after doxycyline and prednisone course  - Repeat Spirometry today mildly improved from June 2019 - Continues Anoro daily

## 2018-06-07 NOTE — Progress Notes (Signed)
@Patient  ID: [name], male    DOB: 1945-10-08, 72 y.o.   MRN: [id number]  Chief Complaint  Patient presents with  . Follow-up    Doing better since last ov.     Referring provider: Kirstie Peri, MD  HPI: 72 year old male, former smoker quit in 2012 (55-pack-year history).  Medical history significant for chronic respiratory failure, COPD, CAD, hypertension.  Patient of Dr. Vassie Loll, last seen in June 2019 for surgical clearance.  He was on Advair and Symbicort for many years, this was changed to Anoro in about 2017.  Rare use of rescue inhaler.  Chronic cough and throat clearing felt possibly related to lisinopril.    Dobutamine stress test in 2015 did not show any signs of ischemia, suggested inferior wall scarring.  Echocardiogram in 2015 showed normal LV function. Spirometry done in June 2019 which showed moderate airway obstruction with a ratio of 49, FEV1 of 66% and FVC of 96%  05/31/2018 Patient presents today for surgical clearance for right total hip replacement with [facility]. Accompanied by daughter and his grandson. He successfully underwent surgery for LEFT total hip arthroplasty with Dr. Cleophas Dunker back in August. Reports that he feels in his normal health today. He does complain of shortness of breath, wheezing and cough with white mucus. He is oxygen dependent at home, uses 4L. He did not bring oxygen today. We need to check labs and CXR.   06/07/2018 Patient presents today for 1 week follow-up COPD exacerbation and pre-op clearance. Planning for RIGHT total hip arthroplasty with Dr. Cleophas Dunker from [facility], date to be determined. Patient is independent, ambulates with cane. Recent CXR showed emphysema with no acute abnormalities. Completed doxycyline course, has a few days left of prednisone taper. Reports that he feels a great deal better. Breathing is pretty good as long as he doesn't walk too far. Wearing oxygen today, he is on 3-4L oxygen at home  continuously and at night. Cough remains productive with white colored mucus. Denies wheezing.     Testing: Spirometry in June 2019 FVC 3.1 (96%), FEV1 1.5 (66%), ratio 46 (67%) Spirometry 06/07/2018 - FVC  3.2 (91%), FEV1 1.7 (67%), ratio 53 (72%)    No Known Allergies  Immunization History  Administered Date(s) Administered  . Influenza,inj,quad, With Preservative 04/23/2017  . Pneumococcal Polysaccharide-23 12/30/2013, 04/23/2017    Past Medical History:  Diagnosis Date  . Asthma   . Carotid artery disease (HCC)    50-69% RICA and less than 50% LICA, 07/2011  . COPD (chronic obstructive pulmonary disease) (HCC)   . Coronary atherosclerosis of native coronary artery    Based on abnormal Myoview - inferior scar, 05/2011; LVEF 45-50%, echo, 07/2011  . Essential hypertension, benign   . HLD (hyperlipidemia)   . NSTEMI (non-ST elevated myocardial infarction) (HCC)    12/12, minor  . On home oxygen therapy    4Lnc with exertion  . Peripheral arterial disease (HCC)   . Tobacco abuse     Tobacco History: Social History   Tobacco Use  Smoking Status Former Smoker  . Packs/day: 2.00  . Years: 55.00  . Pack years: 110.00  . Types: Cigarettes  . Start date: 12/03/1959  . Last attempt to quit: 12/09/2010  . Years since quitting: 7.4  Smokeless Tobacco Never Used  Tobacco Comment   Patient has not somked since midnight on wednesday.    Counseling given: Not Answered Comment: Patient has not somked since midnight on wednesday.    Outpatient Medications  Prior to Visit  Medication Sig Dispense Refill  . albuterol (PROAIR HFA) 108 (90 BASE) MCG/ACT inhaler Inhale 2 puffs into the lungs every 6 (six) hours as needed for shortness of breath.     Marland Kitchen albuterol (PROVENTIL) (2.5 MG/3ML) 0.083% nebulizer solution Take 2.5 mg by nebulization every 6 (six) hours as needed for shortness of breath.     Marland Kitchen aspirin 81 MG chewable tablet Chew 1 tablet (81 mg total) by mouth 2 (two) times daily.     Marland Kitchen doxycycline (VIBRA-TABS) 100 MG tablet Take 1 tablet (100 mg total) by mouth 2 (two) times daily. 14 tablet 0  . HYDROcodone-acetaminophen (NORCO/VICODIN) 5-325 MG tablet Take 1 tablet by mouth daily as needed.  0  . lisinopril-hydrochlorothiazide (PRINZIDE,ZESTORETIC) 20-12.5 MG tablet Take 1 tablet by mouth daily.    . nitroGLYCERIN (NITROSTAT) 0.4 MG SL tablet Place 1 tablet (0.4 mg total) under the tongue every 5 (five) minutes x 3 doses as needed for chest pain. 25 tablet 3  . oxymetazoline (AFRIN) 0.05 % nasal spray Place 1 spray into both nostrils as needed for congestion.    . predniSONE (DELTASONE) 10 MG tablet Take 4 tabs po daily x 2 days; then 3 tabs for 2 days; then 2 tabs for 2 days; then 1 tab for 2 days 20 tablet 0  . simvastatin (ZOCOR) 20 MG tablet TAKE 1 TABLET BY MOUTH EVERY DAY (Patient taking differently: Take 20 mg by mouth daily at 6 PM. ) 10 tablet 0  . tetrahydrozoline (VISINE) 0.05 % ophthalmic solution     . umeclidinium-vilanterol (ANORO ELLIPTA) 62.5-25 MCG/INH AEPB Inhale 1 puff daily into the lungs.    . isosorbide mononitrate (IMDUR) 60 MG 24 hr tablet Take 1 tablet (60 mg total) by mouth daily. 90 tablet 3   No facility-administered medications prior to visit.     Review of Systems  Review of Systems  Constitutional: Negative.   HENT: Negative.   Respiratory: Positive for cough. Negative for apnea, choking, chest tightness, shortness of breath, wheezing and stridor.   Cardiovascular: Negative.     Physical Exam  BP 124/72 (BP Location: Left Arm, Patient Position: Sitting, Cuff Size: Normal)   Pulse 96   Ht 5\' 5"  (1.651 m)   Wt 188 lb (85.3 kg)   SpO2 92%   BMI 31.28 kg/m  Physical Exam  Constitutional: He is oriented to person, place, and time. He appears well-developed and well-nourished.  HENT:  Head: Normocephalic and atraumatic.  Eyes: Pupils are equal, round, and reactive to light. EOM are normal.  Neck: Normal range of motion. Neck  supple.  Cardiovascular: Normal rate and regular rhythm.  Regular rhythm, HR 96   Pulmonary/Chest: Effort normal. No respiratory distress. He has wheezes.  Faint exp wheeze, no significant rhonchi. No resp distress. On oxygen   Musculoskeletal:  Ambulates with cane   Neurological: He is alert and oriented to person, place, and time.  Psychiatric: He has a normal mood and affect. His behavior is normal. Judgment and thought content normal.     Lab Results:  CBC    Component Value Date/Time   WBC 13.3 (H) 05/30/2018 1211   RBC 4.16 (L) 05/30/2018 1211   HGB 11.4 (L) 05/30/2018 1211   HCT 35.0 (L) 05/30/2018 1211   PLT 265.0 05/30/2018 1211   MCV 84.1 05/30/2018 1211   MCH 29.3 02/22/2018 0305   MCHC 32.5 05/30/2018 1211   RDW 16.0 (H) 05/30/2018 1211   LYMPHSABS 1.3 05/30/2018  1211   MONOABS 0.9 05/30/2018 1211   EOSABS 0.2 05/30/2018 1211   BASOSABS 0.1 05/30/2018 1211    BMET    Component Value Date/Time   NA 142 05/30/2018 1211   K 3.5 05/30/2018 1211   CL 106 05/30/2018 1211   CO2 27 05/30/2018 1211   GLUCOSE 111 (H) 05/30/2018 1211   BUN 17 05/30/2018 1211   CREATININE 1.02 05/30/2018 1211   CREATININE 0.90 05/21/2013 0704   CALCIUM 9.2 05/30/2018 1211   GFRNONAA >60 02/22/2018 0305   GFRAA >60 02/22/2018 0305    BNP No results found for: BNP  ProBNP No results found for: PROBNP  Imaging: Dg Chest 2 View  Result Date: 05/30/2018 CLINICAL DATA:  Routine. No complaints. Hx of asthma, COPD, HTN. Ex smoker. EXAM: CHEST - 2 VIEW COMPARISON:  02/21/2018 FINDINGS: Heart size is normal. Hyperinflation and paucity of lung markings in the apices, consistent with emphysema. There is chronic atelectasis or scarring at the lung bases. No focal consolidation or pleural effusion. No pulmonary edema. There is atherosclerotic calcification of the thoracic aorta. Stable, a numerous UPPER thoracic wedge compression fractures. IMPRESSION: 1. Emphysema. 2.  No evidence for  acute  abnormality. 3.  Aortic atherosclerosis.  (ICD10-I70.0) 4. Thoracic wedge compression fractures. Electronically Signed   By: Norva PavlovElizabeth  Brown M.D.   On: 05/30/2018 12:49     Assessment & Plan:  72 year old male, former smoker quit in 2012 (55 pack year hx). Medical history significant for chronic respiratory failure, COPD, CAD, hypertension. Patient successfully underwent left total hip arthroplasty back in August with Dr. Cleophas DunkerWhitfield. Presented last week for preop clearance for right total hip arthroplasty however his breathing was labored and he had significant wheezes throughout his lungs fields, he was treated for acute COPD exacerbation with doxycycline and prednisone taper.  Presents today for reevaluation/preop visit.  Patient states that he feels much better, breathing easier.  Still has some shortness of breath when he ambulates.  Cough remains productive with clear mucus.  He is wearing oxygen today, 3 to 4 L with O2 sat of 92%. He is an intermediate risk for pulmonary complications. Recommend short duration of surgery as much as possible, avoid paralytics, pulmonary consult if needed, early ambulation after surgery, O2, bronchodilators, IS and pulmonary toileting. Instructed patient to continue daily Anoro and oxygen. If develops fever, cough with colored mucus, worsening shortness of breath or increase oxygen use to return prior to surgery.  COPD (chronic obstructive pulmonary disease) (HCC) - COPD exacerbation improved after doxycyline and prednisone course  - Repeat Spirometry today mildly improved from June 2019 - Continues Anoro daily   Chronic respiratory failure (HCC) - Stable, continues 3-4 L oxygen  - O2 sat 92% today  - No resp distress or labored breathing   Pre-op evaluation - Cleared from pulmonary standpoint for RIGHT hip surgery with Dr. Cleophas DunkerWhitfield   Recommend 1. Short duration of surgery as much as possible and avoid paralytic if possible 2. Recovery in step down  or ICU with Pulmonary consultation if needed  3. DVT prophylaxis 4. Aggressive pulmonary toilet with O2, bronchodilatation, and incentive spirometry and early ambulation     1) RISK FOR PROLONGED MECHANICAL VENTILAION - > 48h  1A) Arozullah - Prolonged mech ventilation risk Arozullah Postperative Pulmonary Risk Score - for mech ventilation dependence >48h USAA([facility] dataset, Ann Surg 2000, major non-cardiac surgery) Comment Score  Type of surgery - abd ao aneurysm (27), thoracic (21), neurosurgery / upper abdominal / vascular (  21), neck (11)  0  Emergency Surgery - (11)  0  ALbumin < 3 or poor nutritional state - (9)  0  BUN > 30 -  (8)  0  Partial or completely dependent functional status - (7)  0  COPD -  (6)  6  Age - 60 to 69 (4), > 70  (6)  6  TOTAL  12  Risk Stratifcation scores  - < 10 (0.5%), 11-19 (1.8%), 20-27 (4.2%), 28-40 (10.1%), >40 (26.6%)  1.8 % risk score     1B) GUPTA - Prolonged Mech Vent Risk Score source Risk  Guptal post op prolonged mech ventilation > 48h or reintubation < 30 days - ACS 2007-2008 dataset - [email or web address] 0.2 % Risk of mechanical ventilation for >48 hrs after surgery, or unplanned intubation ?30 days of surgery    2) RISK FOR POST OP PNEUMONIA Score source Risk  Chales Abrahams - Post Op Pnemounia risk  [email or web address] 0.1 risk     3) RISK FOR ANY POST-OP PULMONARY COMPLICATION Score source Risk  CANET/ARISCAT Score - risk for ANY/ALl pulmonary complications - > risk of in-hospital post-op pulmonary complications (composite including respiratory failure, respiratory infection, pleural effusion, atelectasis, pneumothorax, bronchospasm, aspiration pneumonitis) [employer].es - based on age, anemia, pulse ox, resp infection prior 30d, incision site, duration of surgery, and emergency v elective surgery 28  points- Intermediate risk 13.3% risk of in-hospital post-op pulmonary complications (composite including respiratory failure, respiratory infection, pleural effusion, atelectasis, pneumothorax, bronchospasm, aspiration pneumonitis)    Glenford Bayley, NP 06/07/2018

## 2018-06-07 NOTE — Patient Instructions (Addendum)
Cleared from pulmonary standpoint for RIGHT hip surgery with Dr. Cleophas DunkerWhitfield   Continue oxygen and Anoro daily   Encourage early ambulation after surgery, incentive spirometry and compression device to legs (SCDs) while in bed   Return if develop colored mucus production, fever or worsening shortness of breath

## 2018-06-07 NOTE — Assessment & Plan Note (Addendum)
-   Cleared from pulmonary standpoint for RIGHT hip surgery with Dr. Cleophas DunkerWhitfield   Recommend 1. Short duration of surgery as much as possible and avoid paralytic if possible 2. Recovery in step down or ICU with Pulmonary consultation if needed  3. DVT prophylaxis 4. Aggressive pulmonary toilet with O2, bronchodilatation, and incentive spirometry and early ambulation

## 2018-06-10 ENCOUNTER — Telehealth (INDEPENDENT_AMBULATORY_CARE_PROVIDER_SITE_OTHER): Payer: Self-pay | Admitting: Orthopaedic Surgery

## 2018-06-10 NOTE — Telephone Encounter (Signed)
Please advise 

## 2018-06-10 NOTE — Telephone Encounter (Signed)
Patients daughter calling to find out about clearances for patients hip surgery. Have you received those yet? Patient ready to schedule surgery if possible. Please call to advise.

## 2018-06-12 ENCOUNTER — Telehealth (INDEPENDENT_AMBULATORY_CARE_PROVIDER_SITE_OTHER): Payer: Self-pay | Admitting: Orthopaedic Surgery

## 2018-06-12 ENCOUNTER — Ambulatory Visit (INDEPENDENT_AMBULATORY_CARE_PROVIDER_SITE_OTHER): Payer: Medicare Other | Admitting: Orthopedic Surgery

## 2018-06-12 NOTE — H&P (Signed)
TOTAL HIP ADMISSION H&P  Patient is admitted for right total hip arthroplasty.  Subjective:  Chief Complaint: right hip pain  HPI: [name], 72 y.o. male, has a history of pain and functional disability in the right hip(s) due to arthritis and patient has failed non-surgical conservative treatments for greater than 12 weeks to include NSAID's and/or analgesics, use of assistive devices, weight reduction as appropriate and activity modification.  Onset of symptoms was gradual starting 3 years ago with gradually worsening course since that time.The patient noted no past surgery on the right hip(s).  Patient currently rates pain in the right hip at 8 out of 10 with activity. Patient has night pain, worsening of pain with activity and weight bearing, trendelenberg gait, pain that interfers with activities of daily living and pain with passive range of motion. Patient has evidence of subchondral cysts, subchondral sclerosis, periarticular osteophytes, joint space narrowing and femoral head collapse by imaging studies. This condition presents safety issues increasing the risk of falls. There is no current active infection.  Patient Active Problem List   Diagnosis Date Noted  . Pre-op evaluation 06/07/2018  . Primary osteoarthritis of left hip 02/19/2018  . Avascular necrosis of bone of hip, left (HCC) 02/19/2018  . S/P hip replacement, left 02/19/2018  . Unilateral primary osteoarthritis, left hip 01/09/2018  . Atherosclerosis of native arteries of the extremities with intermittent claudication 03/30/2014  . Aftercare following surgery of the circulatory system, NEC 03/30/2014  . PAD (peripheral artery disease) (HCC) 03/30/2014  . Muscle cramps- Right  Flank / Sharyne Peachrubk 03/30/2014  . Paresthesia of right foot 03/30/2014  . Chest pain 12/30/2013  . Unstable angina (HCC) 12/29/2013  . Chest pain at rest 12/29/2013  . Chronic respiratory failure (HCC) 12/29/2013  . COPD (chronic obstructive  pulmonary disease) (HCC)   . HTN (hypertension), malignant 10/07/2013  . Dyspnea 03/06/2013  . Tobacco abuse   . HLD (hyperlipidemia)   . Peripheral vascular disease, unspecified (HCC) 03/26/2012  . Coronary atherosclerosis of native coronary artery 07/28/2011  . Mixed hyperlipidemia 07/28/2011  . Essential hypertension, benign 07/28/2011  . Peripheral arterial disease (HCC) 07/28/2011  . Carotid artery disease (HCC) 07/28/2011   Past Medical History:  Diagnosis Date  . Asthma   . Carotid artery disease (HCC)    50-69% RICA and less than 50% LICA, 07/2011  . COPD (chronic obstructive pulmonary disease) (HCC)   . Coronary atherosclerosis of native coronary artery    Based on abnormal Myoview - inferior scar, 05/2011; LVEF 45-50%, echo, 07/2011  . Essential hypertension, benign   . HLD (hyperlipidemia)   . NSTEMI (non-ST elevated myocardial infarction) (HCC)    12/12, minor  . On home oxygen therapy    4Lnc with exertion  . Peripheral arterial disease (HCC)   . Tobacco abuse     Past Surgical History:  Procedure Laterality Date  . ABDOMINAL SURGERY     2008  . COLONOSCOPY    . Right axillary to femoral bypass  3/12   Dr. Hart RochesterLawson  . RIGHT HYDROCELE REPAIR    . Right to left femoral to femoral bypass  3/12   Dr. Hart RochesterLawson  . SIGMOID RESECTION / RECTOPEXY  2005  . skin abscess drainage    . TOTAL HIP ARTHROPLASTY Left 02/19/2018   Procedure: LEFT TOTAL HIP ARTHROPLASTY;  Surgeon: Valeria BatmanWhitfield, Peter W, MD;  Location: [facility] OR;  Service: Orthopedics;  Laterality: Left;    No current facility-administered medications for this encounter.  Current Outpatient Medications  Medication Sig Dispense Refill Last Dose  . albuterol (PROAIR HFA) 108 (90 BASE) MCG/ACT inhaler Inhale 2 puffs into the lungs every 6 (six) hours as needed for shortness of breath.    Taking  . albuterol (PROVENTIL) (2.5 MG/3ML) 0.083% nebulizer solution Take 2.5 mg by nebulization every 6 (six) hours as needed for  shortness of breath.    Taking  . aspirin 81 MG chewable tablet Chew 1 tablet (81 mg total) by mouth 2 (two) times daily.   Taking  . doxycycline (VIBRA-TABS) 100 MG tablet Take 1 tablet (100 mg total) by mouth 2 (two) times daily. 14 tablet 0 Taking  . HYDROcodone-acetaminophen (NORCO/VICODIN) 5-325 MG tablet Take 1 tablet by mouth daily as needed.  0 Taking  . isosorbide mononitrate (IMDUR) 60 MG 24 hr tablet Take 1 tablet (60 mg total) by mouth daily. 90 tablet 3 Taking  . lisinopril-hydrochlorothiazide (PRINZIDE,ZESTORETIC) 20-12.5 MG tablet Take 1 tablet by mouth daily.   Taking  . nitroGLYCERIN (NITROSTAT) 0.4 MG SL tablet Place 1 tablet (0.4 mg total) under the tongue every 5 (five) minutes x 3 doses as needed for chest pain. 25 tablet 3 Taking  . oxymetazoline (AFRIN) 0.05 % nasal spray Place 1 spray into both nostrils as needed for congestion.   Taking  . predniSONE (DELTASONE) 10 MG tablet Take 4 tabs po daily x 2 days; then 3 tabs for 2 days; then 2 tabs for 2 days; then 1 tab for 2 days 20 tablet 0 Taking  . simvastatin (ZOCOR) 20 MG tablet TAKE 1 TABLET BY MOUTH EVERY DAY (Patient taking differently: Take 20 mg by mouth daily at 6 PM. ) 10 tablet 0 Taking  . tetrahydrozoline (VISINE) 0.05 % ophthalmic solution    Taking  . umeclidinium-vilanterol (ANORO ELLIPTA) 62.5-25 MCG/INH AEPB Inhale 1 puff daily into the lungs.   Taking   No Known Allergies  Social History   Tobacco Use  . Smoking status: Former Smoker    Packs/day: 2.00    Years: 55.00    Pack years: 110.00    Types: Cigarettes    Start date: 12/03/1959    Last attempt to quit: 12/09/2010    Years since quitting: 7.5  . Smokeless tobacco: Never Used  . Tobacco comment: Patient has not somked since midnight on wednesday.   Substance Use Topics  . Alcohol use: No    Alcohol/week: 0.0 standard drinks    Family History  Problem Relation Age of Onset  . Heart attack Father   . Heart attack Mother     Review of Systems   Constitutional: Negative for fatigue and fever.  HENT: Negative for ear pain.   Eyes: Negative for pain.  Respiratory: Positive for cough unchanged shortness of breath.   Cardiovascular: Negative for leg swelling.  Gastrointestinal: Negative for constipation and diarrhea.  Genitourinary: Negative for difficulty urinating.  Musculoskeletal: Negative for back pain and neck pain.  Skin: Negative for rash.  Allergic/Immunologic: Negative for food allergies.  Neurological: Positive for weakness. Negative for numbness.  Hematological: Does not bruise/bleed easily.  Psychiatric/Behavioral: Negative for sleep disturb   Objective:  Physical Exam  Vital signs in last 24 hours:    Labs:   Estimated body mass index is 31.28 kg/m as calculated from the following:   Height as of 06/07/18: 5\' 5"  (1.651 m).   Weight as of 06/07/18: 85.3 kg.   Imaging Review Plain radiographs demonstrate severe degenerative joint disease of the right hip(s).  The bone quality appears to be fair for age and reported activity level.    Preoperative templating of the joint replacement has been completed, documented, and submitted to the Operating Room personnel in order to optimize intra-operative equipment management.     Assessment/Plan:  End stage arthritis, right hip(s)  The patient history, physical examination, clinical judgement of the provider and imaging studies are consistent with end stage degenerative joint disease of the right hip(s) and total hip arthroplasty is deemed medically necessary. The treatment options including medical management, injection therapy, arthroscopy and arthroplasty were discussed at length. The risks and benefits of total hip arthroplasty were presented and reviewed. The risks due to aseptic loosening, infection, stiffness, dislocation/subluxation,  thromboembolic complications and other imponderables were discussed.  The patient acknowledged the explanation, agreed to  proceed with the plan and consent was signed. Patient is being admitted for inpatient treatment for surgery, pain control, PT, OT, prophylactic antibiotics, VTE prophylaxis, progressive ambulation and ADL's and discharge planning.The patient is planning to be discharged to skilled nursing facility

## 2018-06-12 NOTE — Telephone Encounter (Signed)
Patient's grandaughter Summer called stating that there is no transportation to bring her grandfather to his appointment today.  Summer stated that he needs an appointment on a Friday or Monday only.  Please return her call at [phone]

## 2018-06-12 NOTE — Telephone Encounter (Signed)
FYI

## 2018-06-12 NOTE — Telephone Encounter (Signed)
Will need to do H&P on day of surgery.Marland Kitchen.Marland Kitchen.Marland Kitchen.Please text me the day before as a reminder, thanks

## 2018-06-19 ENCOUNTER — Ambulatory Visit (INDEPENDENT_AMBULATORY_CARE_PROVIDER_SITE_OTHER): Payer: Self-pay

## 2018-06-19 ENCOUNTER — Encounter (INDEPENDENT_AMBULATORY_CARE_PROVIDER_SITE_OTHER): Payer: Self-pay | Admitting: Orthopaedic Surgery

## 2018-06-19 ENCOUNTER — Ambulatory Visit (INDEPENDENT_AMBULATORY_CARE_PROVIDER_SITE_OTHER): Payer: Medicare Other | Admitting: Orthopaedic Surgery

## 2018-06-19 VITALS — BP 151/89 | HR 102 | Ht 65.0 in | Wt 188.0 lb

## 2018-06-19 DIAGNOSIS — Z96642 Presence of left artificial hip joint: Secondary | ICD-10-CM | POA: Diagnosis not present

## 2018-06-19 DIAGNOSIS — M25551 Pain in right hip: Secondary | ICD-10-CM

## 2018-06-19 NOTE — Progress Notes (Signed)
Office Visit Note   Patient: [name]           Date of Birth: 10-May-1946           MRN: [id number] Visit Date: 06/19/2018              Requested by: Kirstie Peri, MD [address] [place], [place] [address] PCP: Kirstie Peri, MD   Assessment & Plan: Visit Diagnoses:  1. Pain in right hip   2. Status post left hip replacement     Plan: 4 months status post primary left total hip replacement and doing well.  Mr. Huesman would like to proceed with a left total hip replacement.  Films demonstrate significant degenerative change.  He will need clearance particularly for his respiratory issues  Follow-Up Instructions: Return will schedule left THR.   Orders:  Orders Placed This Encounter  Procedures  . XR HIP UNILAT W OR W/O PELVIS 2-3 VIEWS RIGHT   No orders of the defined types were placed in this encounter.     Procedures: No procedures performed   Clinical Data: No additional findings.   Subjective: Chief Complaint  Patient presents with  . Right Hip - Follow-up  Patient presents with right hip pain. He is scheduled for right total hip arthroplasty on 06/25/18.  He states that he was cleared for surgery by Dr. Sherryll Burger this morning.No  Related problems referable to his left hip replacement performed in August  HPI  Review of Systems   Objective: Vital Signs: BP (!) 151/89   Pulse (!) 102   Ht 5\' 5"  (1.651 m)   Wt 188 lb (85.3 kg)   BMI 31.28 kg/m   Physical Exam  Constitutional: He is oriented to person, place, and time. He appears well-developed and well-nourished.  HENT:  Mouth/Throat: Oropharynx is clear and moist.  Eyes: Pupils are equal, round, and reactive to light. EOM are normal.  Pulmonary/Chest: He has wheezes.  Neurological: He is alert and oriented to person, place, and time.  Skin: Skin is warm and dry.  Psychiatric: He has a normal mood and affect. His behavior is normal.    Ortho Exam walks with a limp referable to his right hip.  No pain  referable to the left extremity.  About 1/2 inch shorter on the right compared to the left.  Very minimal motion right hip compared to left related to pain.  Motor is intact  Specialty Comments:  No specialty comments available.  Imaging: No results found.   PMFS History: Patient Active Problem List   Diagnosis Date Noted  . Pain in right hip 06/19/2018  . Pre-op evaluation 06/07/2018  . Primary osteoarthritis of left hip 02/19/2018  . Avascular necrosis of bone of hip, left (HCC) 02/19/2018  . Status post left hip replacement 02/19/2018  . Unilateral primary osteoarthritis, left hip 01/09/2018  . Atherosclerosis of native arteries of the extremities with intermittent claudication 03/30/2014  . Aftercare following surgery of the circulatory system, NEC 03/30/2014  . PAD (peripheral artery disease) (HCC) 03/30/2014  . Muscle cramps- Right  Flank / Sharyne Peach 03/30/2014  . Paresthesia of right foot 03/30/2014  . Chest pain 12/30/2013  . Unstable angina (HCC) 12/29/2013  . Chest pain at rest 12/29/2013  . Chronic respiratory failure (HCC) 12/29/2013  . COPD (chronic obstructive pulmonary disease) (HCC)   . HTN (hypertension), malignant 10/07/2013  . Dyspnea 03/06/2013  . Tobacco abuse   . HLD (hyperlipidemia)   . Peripheral vascular disease, unspecified (HCC)  03/26/2012  . Coronary atherosclerosis of native coronary artery 07/28/2011  . Mixed hyperlipidemia 07/28/2011  . Essential hypertension, benign 07/28/2011  . Peripheral arterial disease (HCC) 07/28/2011  . Carotid artery disease (HCC) 07/28/2011   Past Medical History:  Diagnosis Date  . Asthma   . Carotid artery disease (HCC)    50-69% RICA and less than 50% LICA, 07/2011  . COPD (chronic obstructive pulmonary disease) (HCC)   . Coronary atherosclerosis of native coronary artery    Based on abnormal Myoview - inferior scar, 05/2011; LVEF 45-50%, echo, 07/2011  . Essential hypertension, benign   . HLD (hyperlipidemia)   .  NSTEMI (non-ST elevated myocardial infarction) (HCC)    12/12, minor  . On home oxygen therapy    4Lnc with exertion  . Peripheral arterial disease (HCC)   . Tobacco abuse     Family History  Problem Relation Age of Onset  . Heart attack Father   . Heart attack Mother     Past Surgical History:  Procedure Laterality Date  . ABDOMINAL SURGERY     2008  . COLONOSCOPY    . Right axillary to femoral bypass  3/12   Dr. Hart RochesterLawson  . RIGHT HYDROCELE REPAIR    . Right to left femoral to femoral bypass  3/12   Dr. Hart RochesterLawson  . SIGMOID RESECTION / RECTOPEXY  2005  . skin abscess drainage    . TOTAL HIP ARTHROPLASTY Left 02/19/2018   Procedure: LEFT TOTAL HIP ARTHROPLASTY;  Surgeon: Valeria BatmanWhitfield, Lilyan Prete W, MD;  Location: [facility] OR;  Service: Orthopedics;  Laterality: Left;   Social History   Occupational History  . Not on file  Tobacco Use  . Smoking status: Former Smoker    Packs/day: 2.00    Years: 55.00    Pack years: 110.00    Types: Cigarettes    Start date: 12/03/1959    Last attempt to quit: 12/09/2010    Years since quitting: 7.5  . Smokeless tobacco: Never Used  . Tobacco comment: Patient has not somked since midnight on wednesday.   Substance and Sexual Activity  . Alcohol use: No    Alcohol/week: 0.0 standard drinks  . Drug use: No  . Sexual activity: Not on file

## 2018-06-21 NOTE — Pre-Procedure Instructions (Signed)
[name]  06/21/2018      [facility] - [place], [place] - [place], [place] - [address] [place] [address] Phone: [phone] Fax: [phone]    Your procedure is scheduled on Tuesday December 17.  Report to [facility] Admitting at 5:30 A.M.  Call this number if you have problems the morning of surgery:  [phone]   Remember:  Do not eat or drink after midnight.    Take these medicines the morning of surgery with A SIP OF WATER:   Isosorbide (Imdur) Hydrocodone-acetaminophen (NORCO) if needed Albuterol if needed (please bring inhalers to hospital with you) Afrin nasal spray if needed ANORO Ellipta   7 days prior to surgery STOP taking any Aleve, Naproxen, Ibuprofen, Motrin, Advil, Goody's, BC's, all herbal medications, fish oil, and all vitamins  FOLLOW your surgeon's instructions on stopping Aspirin. If no instructions were given, please call your surgeon's office.     Do not wear jewelry  Do not wear lotions, powders, or colognes, or deodorant.  Do not shave 48 hours prior to surgery.  Men may shave face and neck.  Do not bring valuables to the hospital.  [facility] is not responsible for any belongings or valuables.  Contacts, dentures or bridgework may not be worn into surgery.  Leave your suitcase in the car.  After surgery it may be brought to your room.  For patients admitted to the hospital, discharge time will be determined by your treatment team.  Patients discharged the day of surgery will not be allowed to drive home.   Special instructions:    [facility]- Preparing For Surgery  Before surgery, you can play an important role. Because skin is not sterile, your skin needs to be as free of germs as possible. You can reduce the number of germs on your skin by washing with CHG (chlorahexidine gluconate) Soap before surgery.  CHG is an antiseptic cleaner which kills germs and bonds with the skin to continue killing  germs even after washing.    Oral Hygiene is also important to reduce your risk of infection.  Remember - BRUSH YOUR TEETH THE MORNING OF SURGERY WITH YOUR REGULAR TOOTHPASTE  Please do not use if you have an allergy to CHG or antibacterial soaps. If your skin becomes reddened/irritated stop using the CHG.  Do not shave (including legs and underarms) for at least 48 hours prior to first CHG shower. It is OK to shave your face.  Please follow these instructions carefully.   1. Shower the NIGHT BEFORE SURGERY and the MORNING OF SURGERY with CHG.   2. If you chose to wash your hair, wash your hair first as usual with your normal shampoo.  3. After you shampoo, rinse your hair and body thoroughly to remove the shampoo.  4. Use CHG as you would any other liquid soap. You can apply CHG directly to the skin and wash gently with a scrungie or a clean washcloth.   5. Apply the CHG Soap to your body ONLY FROM THE NECK DOWN.  Do not use on open wounds or open sores. Avoid contact with your eyes, ears, mouth and genitals (private parts). Wash Face and genitals (private parts)  with your normal soap.  6. Wash thoroughly, paying special attention to the area where your surgery will be performed.  7. Thoroughly rinse your body with warm water from the neck down.  8. DO NOT shower/wash with  your normal soap after using and rinsing off the CHG Soap.  9. Pat yourself dry with a CLEAN TOWEL.  10. Wear CLEAN PAJAMAS to bed the night before surgery, wear comfortable clothes the morning of surgery  11. Place CLEAN SHEETS on your bed the night of your first shower and DO NOT SLEEP WITH PETS.    Day of Surgery:  Do not apply any deodorants/lotions.  Please wear clean clothes to the hospital/surgery center.   Remember to brush your teeth WITH YOUR REGULAR TOOTHPASTE.    Please read over the following fact sheets that you were given. Coughing and Deep Breathing, Total Joint Packet, MRSA Information  and Surgical Site Infection Prevention

## 2018-06-24 ENCOUNTER — Other Ambulatory Visit: Payer: Self-pay

## 2018-06-24 ENCOUNTER — Encounter (HOSPITAL_COMMUNITY): Payer: Self-pay

## 2018-06-24 ENCOUNTER — Encounter (HOSPITAL_COMMUNITY): Payer: Self-pay | Admitting: Anesthesiology

## 2018-06-24 ENCOUNTER — Encounter (HOSPITAL_COMMUNITY)
Admission: RE | Admit: 2018-06-24 | Discharge: 2018-06-24 | Disposition: A | Discharge status: Home / Self Care | Payer: Medicare Other | Source: Ambulatory Visit | Attending: Orthopaedic Surgery | Admitting: Orthopaedic Surgery

## 2018-06-24 DIAGNOSIS — Z87891 Personal history of nicotine dependence: Secondary | ICD-10-CM

## 2018-06-24 DIAGNOSIS — I739 Peripheral vascular disease, unspecified: Secondary | ICD-10-CM

## 2018-06-24 DIAGNOSIS — M1611 Unilateral primary osteoarthritis, right hip: Secondary | ICD-10-CM

## 2018-06-24 DIAGNOSIS — I2510 Atherosclerotic heart disease of native coronary artery without angina pectoris: Secondary | ICD-10-CM | POA: Insufficient documentation

## 2018-06-24 DIAGNOSIS — I10 Essential (primary) hypertension: Secondary | ICD-10-CM

## 2018-06-24 DIAGNOSIS — Z9889 Other specified postprocedural states: Secondary | ICD-10-CM

## 2018-06-24 DIAGNOSIS — J449 Chronic obstructive pulmonary disease, unspecified: Secondary | ICD-10-CM

## 2018-06-24 DIAGNOSIS — I214 Non-ST elevation (NSTEMI) myocardial infarction: Secondary | ICD-10-CM | POA: Insufficient documentation

## 2018-06-24 DIAGNOSIS — Z01812 Encounter for preprocedural laboratory examination: Secondary | ICD-10-CM

## 2018-06-24 DIAGNOSIS — Z79899 Other long term (current) drug therapy: Secondary | ICD-10-CM | POA: Insufficient documentation

## 2018-06-24 DIAGNOSIS — Z7982 Long term (current) use of aspirin: Secondary | ICD-10-CM

## 2018-06-24 DIAGNOSIS — E785 Hyperlipidemia, unspecified: Secondary | ICD-10-CM

## 2018-06-24 LAB — COMPREHENSIVE METABOLIC PANEL
ALBUMIN: 3.8 g/dL (ref 3.5–5.0)
ALT: 22 U/L (ref 0–44)
AST: 34 U/L (ref 15–41)
Alkaline Phosphatase: 34 U/L — ABNORMAL LOW (ref 38–126)
Anion gap: 10 (ref 5–15)
BUN: 13 mg/dL (ref 8–23)
CHLORIDE: 107 mmol/L (ref 98–111)
CO2: 25 mmol/L (ref 22–32)
Calcium: 8.9 mg/dL (ref 8.9–10.3)
Creatinine, Ser: 1.02 mg/dL (ref 0.61–1.24)
GFR calc Af Amer: >60 mL/min (ref >60)
GFR calc non Af Amer: >60 mL/min (ref >60)
Glucose, Bld: 145 mg/dL — ABNORMAL HIGH (ref 70–99)
Potassium: 3.4 mmol/L — ABNORMAL LOW (ref 3.5–5.1)
SODIUM: 142 mmol/L (ref 135–145)
Total Bilirubin: 0.4 mg/dL (ref 0.3–1.2)
Total Protein: 7.6 g/dL (ref 6.5–8.1)

## 2018-06-24 LAB — URINALYSIS, ROUTINE W REFLEX MICROSCOPIC
Bilirubin Urine: NEGATIVE
Glucose, UA: NEGATIVE mg/dL
Hgb urine dipstick: NEGATIVE
Ketones, ur: NEGATIVE mg/dL
Leukocytes, UA: NEGATIVE
Nitrite: NEGATIVE
Protein, ur: NEGATIVE mg/dL
Specific Gravity, Urine: 1.017 (ref 1.005–1.030)
pH: 6 (ref 5.0–8.0)

## 2018-06-24 LAB — CBC WITH DIFFERENTIAL/PLATELET
Abs Immature Granulocytes: 0.04 10*3/uL (ref 0.00–0.07)
BASOS PCT: 1 %
Basophils Absolute: 0.1 10*3/uL (ref 0.0–0.1)
Eosinophils Absolute: 0.3 10*3/uL (ref 0.0–0.5)
Eosinophils Relative: 2 %
HCT: 38.3 % — ABNORMAL LOW (ref 39.0–52.0)
Hemoglobin: 11.5 g/dL — ABNORMAL LOW (ref 13.0–17.0)
Immature Granulocytes: 0 %
Lymphocytes Relative: 14 %
Lymphs Abs: 1.6 10*3/uL (ref 0.7–4.0)
MCH: 26.3 pg (ref 26.0–34.0)
MCHC: 30 g/dL (ref 30.0–36.0)
MCV: 87.4 fL (ref 80.0–100.0)
Monocytes Absolute: 0.7 10*3/uL (ref 0.1–1.0)
Monocytes Relative: 6 %
Neutro Abs: 8.9 10*3/uL — ABNORMAL HIGH (ref 1.7–7.7)
Neutrophils Relative %: 77 %
Platelets: 273 10*3/uL (ref 150–400)
RBC: 4.38 MIL/uL (ref 4.22–5.81)
RDW: 15.8 % — ABNORMAL HIGH (ref 11.5–15.5)
WBC: 11.6 10*3/uL — ABNORMAL HIGH (ref 4.0–10.5)
nRBC: 0 % (ref 0.0–0.2)

## 2018-06-24 LAB — APTT: aPTT: 32 seconds (ref 24–36)

## 2018-06-24 LAB — PROTIME-INR
INR: 1.11
Prothrombin Time: 14.2 seconds (ref 11.4–15.2)

## 2018-06-24 LAB — SURGICAL PCR SCREEN
MRSA, PCR: NEGATIVE
Staphylococcus aureus: NEGATIVE

## 2018-06-24 MED ORDER — TRANEXAMIC ACID 1000 MG/10ML IV SOLN
2000.0000 mg | INTRAVENOUS | Status: AC
  Administered 2018-06-25: 2000 mg via TOPICAL
  Filled 2018-06-24: qty 20

## 2018-06-24 NOTE — Progress Notes (Signed)
Anesthesia Chart Review:  Case:  [id number] Date/Time:  06/25/18 0700   Procedure:  RIGHT TOTAL HIP ARTHROPLASTY (Right )   Anesthesia type:  Choice   Pre-op diagnosis:  right hip osteoarthritis   Location:  [facility] ROOM 07 / [facility]   Surgeon:  Valeria Batman, MD      DISCUSSION: 72 yo male former smoker for above procedure. He is s/p left THA 02/19/18 with spinal anesthesia.   Pertinent hx includes COPD, PAD (s/p right axilla femoral and right to left femoral femoral bypass graft in March 2012), asthma, HTN, HLD, O2 use (3-4L via [place] with exertion), carotid stenosis, NSTEMI, CAD (Based on abnormal Myoview - inferior scar).  - Patient has pulmonology clearance from Ames Dura, NP. On 06/07/18: "Cleared from pulmonary standpoint for RIGHT hip surgery with Dr. Cleophas Dunker  Recommend 1. Short duration of surgery as much as possible and avoid paralytic if possible 2. Recovery in step down or ICU with Pulmonary consultation if needed  3. DVT prophylaxis 4. Aggressive pulmonary toilet with O2, bronchodilatation, and incentive spirometry and early ambulation."  He was previously cleared by cardiologist Dr. Purvis Sheffield for his left THA which he underwent in August. He was felt to be at "increased risk for major adverse cardiac events in the perioperative period due to both coronary artery disease and peripheral arterial disease as well as COPD", but did have a low risk Myoview on 12/21/17 (EF 58%, findings consistent with prior inferior MI which as also noted on 12/2013 stress test).   He has pulmonology clearance following resolution of COPD exacerbation. He had a low risk stress test earlier this year and went on to tolerate similar procedure. Based on currently available information, I would anticipate that he can proceed as planned if no acute changes. Last ASA dose 06/19/18. Urine culture still in process.   VS: BP (!) 154/72   Pulse 92   Temp 36.6 C   Resp 20   Ht 5\' 5"  (1.651 m)   Wt  85.4 kg   SpO2 96%   BMI 31.33 kg/m    PROVIDERS: Kirstie Peri, MD is PCP - Cyril Mourning, MD is Pulmonologist. Last seen 06/07/2018 by Ames Dura, NP for follow-up and preoperative evaluation following treatment for acute COPD exeracerbation (s/p treatment with doxycycline and prednisone).  Prentice Docker, MD is Cardiologist las seen 12/07/2017 - Josephina Gip, MD is vascular surgeon (now retired). Last visit 05/21/17 with Nickel, Rosalita Chessman, NP.   LABS: Labs reviewed: Acceptable for surgery. Urine culture still in process.  (all labs ordered are listed, but only abnormal results are displayed)  Labs Reviewed  CBC WITH DIFFERENTIAL/PLATELET - Abnormal; Notable for the following components:      Result Value   WBC 11.6 (*)    Hemoglobin 11.5 (*)    HCT 38.3 (*)    RDW 15.8 (*)    Neutro Abs 8.9 (*)    All other components within normal limits  COMPREHENSIVE METABOLIC PANEL - Abnormal; Notable for the following components:   Potassium 3.4 (*)    Glucose, Bld 145 (*)    Alkaline Phosphatase 34 (*)    All other components within normal limits  SURGICAL PCR SCREEN  URINE CULTURE  APTT  PROTIME-INR  URINALYSIS, ROUTINE W REFLEX MICROSCOPIC  TYPE AND SCREEN    IMAGES: CXR 05/30/2018 IMPRESSION: 1. Emphysema. 2.  No evidence for acute  abnormality. 3.  Aortic atherosclerosis.  (ICD10-I70.0) 4. Thoracic wedge compression fractures.  Chest CT 01/07/2018  IMPRESSION: - Chronic obstructive pulmonary type changes with centrilobular emphysema most notable upper lung zones. Bilateral lower lobe peribronchial thickening. - Bilateral lower lobe and right middle lobe scarring/atelectasis similar to prior exam. No new worrisome lung mass identified. - Increase in size and number of normal to slightly prominent size mediastinal lymph nodes as detailed above. Given this slight change in addition to patient's long history of smoking, follow-up chest CT in 6-12 months may be  considered. - Coronary artery calcifications   EKG: 12/07/2017: Sinus Rhythm. Right bundle branch block.   CV: Myoview 12/21/2017:  There was no ST segment deviation noted during stress.  Defect 1: There is a medium defect of moderate severity present in the mid inferior and apical inferior location.  Findings consistent with prior myocardial infarction.  This is a low risk study.  Nuclear stress EF: 58%.  Carotid Duplex 05/19/2016: 1.  1 to 39% bilateral internal carotid artery stenosis. 2.  Right external carotid artery stenosis.  Echo 12/29/2013: Study Conclusions - Procedure narrative: Transthoracic echocardiography. Image quality was poor, due to poor sound transmission. - Left ventricle: Systolic function was difficult to assess, but appeared grossly normal. Estimated EF 50-55%. The cavity size was normal. Wall thickness was increased in a pattern of mild LVH. Images were inadequate for LV wall motion assessment. There was an increased relative contribution of atrial contraction to ventricular filling. Doppler parameters are consistent with abnormal left ventricular relaxation (grade 1 diastolic dysfunction). Doppler parameters are consistent with high ventricular filling pressure. Impressions: - Technically difficult study due to poor sound transmission. Poor valvular visualization and endocardial definition. Would recommend limited echo with contrast enhancement for a more accurate assessment of left ventricular systolic function and regional wall motion.   PULM: Spirometry 06/07/2018: FVC 3.2 (91%), FEV1 1.7 (67%). Mild airway obstruction.   Past Medical History:  Diagnosis Date  . Asthma   . Carotid artery disease (HCC)    50-69% RICA and less than 50% LICA, 07/2011  . COPD (chronic obstructive pulmonary disease) (HCC)   . Coronary atherosclerosis of native coronary artery    Based on abnormal Myoview - inferior scar,  05/2011; LVEF 45-50%, echo, 07/2011  . Essential hypertension, benign   . HLD (hyperlipidemia)   . NSTEMI (non-ST elevated myocardial infarction) (HCC)    12/12, minor  . On home oxygen therapy    4Lnc with exertion  . Peripheral arterial disease (HCC)   . Tobacco abuse     Past Surgical History:  Procedure Laterality Date  . ABDOMINAL SURGERY     2008  . COLONOSCOPY    . Right axillary to femoral bypass  3/12   Dr. Hart RochesterLawson  . RIGHT HYDROCELE REPAIR    . Right to left femoral to femoral bypass  3/12   Dr. Hart RochesterLawson  . SIGMOID RESECTION / RECTOPEXY  2005  . skin abscess drainage    . TOTAL HIP ARTHROPLASTY Left 02/19/2018   Procedure: LEFT TOTAL HIP ARTHROPLASTY;  Surgeon: Valeria BatmanWhitfield, Peter W, MD;  Location: [facility];  Service: Orthopedics;  Laterality: Left;    MEDICATIONS: . albuterol (PROAIR HFA) 108 (90 BASE) MCG/ACT inhaler  . albuterol (PROVENTIL) (2.5 MG/3ML) 0.083% nebulizer solution  . aspirin 81 MG chewable tablet  . HYDROcodone-acetaminophen (NORCO/VICODIN) 5-325 MG tablet  . isosorbide mononitrate (IMDUR) 60 MG 24 hr tablet  . lisinopril-hydrochlorothiazide (PRINZIDE,ZESTORETIC) 20-12.5 MG tablet  . nitroGLYCERIN (NITROSTAT) 0.4 MG SL tablet  . oxymetazoline (AFRIN) 0.05 % nasal spray  . simvastatin (ZOCOR) 20 MG  tablet  . tetrahydrozoline (VISINE) 0.05 % ophthalmic solution  . umeclidinium-vilanterol (ANORO ELLIPTA) 62.5-25 MCG/INH AEPB   No current facility-administered medications for this encounter.    Melene Muller ON 06/25/2018] tranexamic acid (CYKLOKAPRON) 2,000 mg in sodium chloride 0.9 % 50 mL Topical Application    Velna Ochs [facility] Stay [facility]/Anesthesiology Phone [phone] 06/24/2018 2:59 PM

## 2018-06-24 NOTE — Progress Notes (Signed)
PCP - Dr. Kirstie PeriAshish Shah Cardiologist - Dr. Theodore DemarkSuresh Koneswarah  Chest x-ray - 05/30/18 EKG - 12/07/17 Stress Test - 12/21/17 ECHO - 12/30/13-Epic; one in 2016, requested from Dr. Paris LoreKoneswarah office.  Cardiac Cath - denies  Sleep Study - denies  Aspirin Instructions: LD 06/19/18  Anesthesia review: Yes, hx of CAD. Requested last Echo from 2016.   Patient denies shortness of breath, fever, cough and chest pain at PAT appointment.    Patient verbalized understanding of instructions that were given to them at the PAT appointment. Patient was also instructed that they will need to review over the PAT instructions again at home before surgery.

## 2018-06-24 NOTE — Anesthesia Preprocedure Evaluation (Addendum)
Anesthesia Evaluation  Patient identified by MRN, date of birth, ID band Patient awake    Reviewed: Allergy & Precautions, NPO status , Patient's Chart, lab work & pertinent test results  Airway Mallampati: I  TM Distance: >3 FB Neck ROM: Full    Dental  (+) Edentulous Upper, Edentulous Lower   Pulmonary shortness of breath and with exertion, asthma , COPD,  COPD inhaler, former smoker,    Pulmonary exam normal breath sounds clear to auscultation       Cardiovascular hypertension, Pt. on medications + angina with exertion + CAD, + Past MI and + Peripheral Vascular Disease  Normal cardiovascular exam Rhythm:Regular Rate:Normal  EKG - 11/2017- NSR with RBBB   Neuro/Psych negative neurological ROS  negative psych ROS   GI/Hepatic negative GI ROS, Neg liver ROS,   Endo/Other  Hyperlipidemia Obesity  Renal/GU negative Renal ROS  negative genitourinary   Musculoskeletal  (+) Arthritis , Osteoarthritis,  OA right hip   Abdominal (+) + obese,   Peds  Hematology negative hematology ROS (+)   Anesthesia Other Findings   Reproductive/Obstetrics                            Anesthesia Physical Anesthesia Plan  ASA: III  Anesthesia Plan: Spinal   Post-op Pain Management:    Induction:   PONV Risk Score and Plan: 2 and Ondansetron, Dexamethasone and Treatment may vary due to age or medical condition  Airway Management Planned: Natural Airway, Nasal Cannula and Simple Face Mask  Additional Equipment:   Intra-op Plan:   Post-operative Plan:   Informed Consent: I have reviewed the patients History and Physical, chart, labs and discussed the procedure including the risks, benefits and alternatives for the proposed anesthesia with the patient or authorized representative who has indicated his/her understanding and acceptance.   Dental advisory given  Plan Discussed with: CRNA and  Surgeon  Anesthesia Plan Comments:        Anesthesia Quick Evaluation

## 2018-06-25 ENCOUNTER — Inpatient Hospital Stay (HOSPITAL_COMMUNITY): Payer: Medicare Other

## 2018-06-25 ENCOUNTER — Encounter (HOSPITAL_COMMUNITY)
Admission: RE | Disposition: A | Discharge status: Skilled Nursing Facility | Payer: Self-pay | Source: Home / Self Care | Attending: Orthopaedic Surgery

## 2018-06-25 ENCOUNTER — Encounter (HOSPITAL_COMMUNITY): Payer: Self-pay | Admitting: *Deleted

## 2018-06-25 ENCOUNTER — Inpatient Hospital Stay (HOSPITAL_COMMUNITY): Payer: Medicare Other | Admitting: Certified Registered Nurse Anesthetist

## 2018-06-25 ENCOUNTER — Inpatient Hospital Stay (HOSPITAL_COMMUNITY)
Admission: RE | Admit: 2018-06-25 | Discharge: 2018-06-27 | DRG: 470 | Disposition: A | Discharge status: Skilled Nursing Facility | Payer: Medicare Other | Attending: Orthopaedic Surgery | Admitting: Orthopaedic Surgery

## 2018-06-25 DIAGNOSIS — I70219 Atherosclerosis of native arteries of extremities with intermittent claudication, unspecified extremity: Secondary | ICD-10-CM | POA: Diagnosis present

## 2018-06-25 DIAGNOSIS — E876 Hypokalemia: Secondary | ICD-10-CM | POA: Diagnosis not present

## 2018-06-25 DIAGNOSIS — Z87891 Personal history of nicotine dependence: Secondary | ICD-10-CM

## 2018-06-25 DIAGNOSIS — J9610 Chronic respiratory failure, unspecified whether with hypoxia or hypercapnia: Secondary | ICD-10-CM | POA: Diagnosis present

## 2018-06-25 DIAGNOSIS — Z23 Encounter for immunization: Secondary | ICD-10-CM | POA: Diagnosis present

## 2018-06-25 DIAGNOSIS — I252 Old myocardial infarction: Secondary | ICD-10-CM

## 2018-06-25 DIAGNOSIS — N179 Acute kidney failure, unspecified: Secondary | ICD-10-CM | POA: Diagnosis not present

## 2018-06-25 DIAGNOSIS — M1612 Unilateral primary osteoarthritis, left hip: Secondary | ICD-10-CM

## 2018-06-25 DIAGNOSIS — E869 Volume depletion, unspecified: Secondary | ICD-10-CM | POA: Diagnosis not present

## 2018-06-25 DIAGNOSIS — Z96641 Presence of right artificial hip joint: Secondary | ICD-10-CM

## 2018-06-25 DIAGNOSIS — M1611 Unilateral primary osteoarthritis, right hip: Secondary | ICD-10-CM | POA: Diagnosis present

## 2018-06-25 DIAGNOSIS — Z8249 Family history of ischemic heart disease and other diseases of the circulatory system: Secondary | ICD-10-CM | POA: Diagnosis not present

## 2018-06-25 DIAGNOSIS — I10 Essential (primary) hypertension: Secondary | ICD-10-CM | POA: Diagnosis present

## 2018-06-25 DIAGNOSIS — I2510 Atherosclerotic heart disease of native coronary artery without angina pectoris: Secondary | ICD-10-CM | POA: Diagnosis present

## 2018-06-25 DIAGNOSIS — Z96642 Presence of left artificial hip joint: Secondary | ICD-10-CM | POA: Diagnosis present

## 2018-06-25 DIAGNOSIS — Z6830 Body mass index (BMI) 30.0-30.9, adult: Secondary | ICD-10-CM

## 2018-06-25 DIAGNOSIS — Z7982 Long term (current) use of aspirin: Secondary | ICD-10-CM | POA: Diagnosis not present

## 2018-06-25 DIAGNOSIS — J449 Chronic obstructive pulmonary disease, unspecified: Secondary | ICD-10-CM | POA: Diagnosis present

## 2018-06-25 DIAGNOSIS — Z9889 Other specified postprocedural states: Secondary | ICD-10-CM

## 2018-06-25 DIAGNOSIS — Z9981 Dependence on supplemental oxygen: Secondary | ICD-10-CM

## 2018-06-25 DIAGNOSIS — E669 Obesity, unspecified: Secondary | ICD-10-CM | POA: Diagnosis present

## 2018-06-25 DIAGNOSIS — D62 Acute posthemorrhagic anemia: Secondary | ICD-10-CM | POA: Diagnosis not present

## 2018-06-25 DIAGNOSIS — E782 Mixed hyperlipidemia: Secondary | ICD-10-CM | POA: Diagnosis present

## 2018-06-25 DIAGNOSIS — Z720 Tobacco use: Secondary | ICD-10-CM | POA: Diagnosis present

## 2018-06-25 DIAGNOSIS — M25551 Pain in right hip: Secondary | ICD-10-CM | POA: Diagnosis present

## 2018-06-25 HISTORY — PX: TOTAL HIP ARTHROPLASTY: SHX124

## 2018-06-25 LAB — URINE CULTURE
Culture: <10000 — AB
Special Requests: NORMAL

## 2018-06-25 SURGERY — ARTHROPLASTY, HIP, TOTAL,POSTERIOR APPROACH
Anesthesia: Spinal | Site: Hip | Laterality: Right

## 2018-06-25 MED ORDER — SODIUM CHLORIDE 0.9 % IV SOLN
75.0000 mL/h | INTRAVENOUS | Status: DC
  Administered 2018-06-25 – 2018-06-26 (×2): 75 mL/h via INTRAVENOUS

## 2018-06-25 MED ORDER — BISACODYL 10 MG RE SUPP: 10.0000 mg | Freq: Every day | RECTAL | Status: DC | PRN

## 2018-06-25 MED ORDER — ALBUTEROL SULFATE (2.5 MG/3ML) 0.083% IN NEBU: 2.5000 mg | INHALATION_SOLUTION | Freq: Four times a day (QID) | RESPIRATORY_TRACT | Status: DC | PRN

## 2018-06-25 MED ORDER — BUPIVACAINE-EPINEPHRINE (PF) 0.25% -1:200000 IJ SOLN
INTRAMUSCULAR | Status: AC
  Filled 2018-06-25: qty 30

## 2018-06-25 MED ORDER — MAGNESIUM HYDROXIDE 400 MG/5ML PO SUSP: 30.0000 mL | Freq: Every day | ORAL | Status: DC | PRN

## 2018-06-25 MED ORDER — PROPOFOL 1000 MG/100ML IV EMUL
INTRAVENOUS | Status: AC
  Filled 2018-06-25: qty 100

## 2018-06-25 MED ORDER — CEFAZOLIN SODIUM-DEXTROSE 2-4 GM/100ML-% IV SOLN
2.0000 g | INTRAVENOUS | Status: AC
  Administered 2018-06-25: 2 g via INTRAVENOUS
  Filled 2018-06-25: qty 100

## 2018-06-25 MED ORDER — FENTANYL CITRATE (PF) 250 MCG/5ML IJ SOLN
INTRAMUSCULAR | Status: AC
  Filled 2018-06-25: qty 5

## 2018-06-25 MED ORDER — SIMVASTATIN 20 MG PO TABS
20.0000 mg | ORAL_TABLET | Freq: Every day | ORAL | Status: DC
  Administered 2018-06-25 – 2018-06-26 (×2): 20 mg via ORAL
  Filled 2018-06-25 (×2): qty 1

## 2018-06-25 MED ORDER — ALBUTEROL SULFATE HFA 108 (90 BASE) MCG/ACT IN AERS: 2.0000 | INHALATION_SPRAY | Freq: Four times a day (QID) | RESPIRATORY_TRACT | Status: DC | PRN

## 2018-06-25 MED ORDER — NAPHAZOLINE-GLYCERIN 0.012-0.2 % OP SOLN
1.0000 [drp] | Freq: Four times a day (QID) | OPHTHALMIC | Status: DC | PRN
  Filled 2018-06-25: qty 15

## 2018-06-25 MED ORDER — ONDANSETRON HCL 4 MG/2ML IJ SOLN
INTRAMUSCULAR | Status: AC
  Filled 2018-06-25: qty 2

## 2018-06-25 MED ORDER — ONDANSETRON HCL 4 MG/2ML IJ SOLN: 4.0000 mg | Freq: Once | INTRAMUSCULAR | Status: DC | PRN

## 2018-06-25 MED ORDER — INFLUENZA VAC SPLIT HIGH-DOSE 0.5 ML IM SUSY
0.5000 mL | PREFILLED_SYRINGE | INTRAMUSCULAR | Status: AC
  Administered 2018-06-27: 0.5 mL via INTRAMUSCULAR
  Filled 2018-06-25: qty 0.5

## 2018-06-25 MED ORDER — ASPIRIN EC 325 MG PO TBEC
325.0000 mg | DELAYED_RELEASE_TABLET | Freq: Every day | ORAL | Status: DC
  Administered 2018-06-26 – 2018-06-27 (×2): 325 mg via ORAL
  Filled 2018-06-25 (×2): qty 1

## 2018-06-25 MED ORDER — LACTATED RINGERS IV SOLN
INTRAVENOUS | Status: DC | PRN
  Administered 2018-06-25 (×2): via INTRAVENOUS

## 2018-06-25 MED ORDER — BISACODYL 5 MG PO TBEC: 5.0000 mg | DELAYED_RELEASE_TABLET | Freq: Every day | ORAL | Status: DC | PRN

## 2018-06-25 MED ORDER — ALBUTEROL SULFATE (2.5 MG/3ML) 0.083% IN NEBU
2.5000 mg | INHALATION_SOLUTION | Freq: Four times a day (QID) | RESPIRATORY_TRACT | Status: DC | PRN
  Administered 2018-06-25: 2.5 mg via RESPIRATORY_TRACT

## 2018-06-25 MED ORDER — METOCLOPRAMIDE HCL 5 MG/ML IJ SOLN: 5.0000 mg | Freq: Three times a day (TID) | INTRAMUSCULAR | Status: DC | PRN

## 2018-06-25 MED ORDER — ALBUMIN HUMAN 5 % IV SOLN
INTRAVENOUS | Status: AC
  Filled 2018-06-25: qty 250

## 2018-06-25 MED ORDER — ALBUTEROL SULFATE (2.5 MG/3ML) 0.083% IN NEBU
INHALATION_SOLUTION | RESPIRATORY_TRACT | Status: AC
  Filled 2018-06-25: qty 3

## 2018-06-25 MED ORDER — LIDOCAINE 2% (20 MG/ML) 5 ML SYRINGE
INTRAMUSCULAR | Status: AC
  Filled 2018-06-25: qty 5

## 2018-06-25 MED ORDER — POLYETHYLENE GLYCOL 3350 17 G PO PACK
17.0000 g | PACK | Freq: Every day | ORAL | Status: DC
  Administered 2018-06-25 – 2018-06-26 (×2): 17 g via ORAL
  Filled 2018-06-25 (×3): qty 1

## 2018-06-25 MED ORDER — MAGNESIUM CITRATE PO SOLN: 1.0000 | Freq: Once | ORAL | Status: DC | PRN

## 2018-06-25 MED ORDER — DEXAMETHASONE SODIUM PHOSPHATE 10 MG/ML IJ SOLN
INTRAMUSCULAR | Status: AC
  Filled 2018-06-25: qty 1

## 2018-06-25 MED ORDER — ONDANSETRON HCL 4 MG/2ML IJ SOLN: 4.0000 mg | Freq: Four times a day (QID) | INTRAMUSCULAR | Status: DC | PRN

## 2018-06-25 MED ORDER — CEFAZOLIN SODIUM-DEXTROSE 2-4 GM/100ML-% IV SOLN
2.0000 g | Freq: Four times a day (QID) | INTRAVENOUS | Status: AC
  Administered 2018-06-25 (×2): 2 g via INTRAVENOUS
  Filled 2018-06-25 (×3): qty 100

## 2018-06-25 MED ORDER — PROPOFOL 10 MG/ML IV BOLUS
INTRAVENOUS | Status: DC | PRN
  Administered 2018-06-25: 20 mg via INTRAVENOUS

## 2018-06-25 MED ORDER — METOCLOPRAMIDE HCL 5 MG PO TABS: 5.0000 mg | ORAL_TABLET | Freq: Three times a day (TID) | ORAL | Status: DC | PRN

## 2018-06-25 MED ORDER — OXYMETAZOLINE HCL 0.05 % NA SOLN
1.0000 | NASAL | Status: DC | PRN
  Filled 2018-06-25: qty 15

## 2018-06-25 MED ORDER — PHENYLEPHRINE 40 MCG/ML (10ML) SYRINGE FOR IV PUSH (FOR BLOOD PRESSURE SUPPORT)
PREFILLED_SYRINGE | INTRAVENOUS | Status: AC
  Filled 2018-06-25: qty 10

## 2018-06-25 MED ORDER — ACETAMINOPHEN 10 MG/ML IV SOLN
1000.0000 mg | Freq: Four times a day (QID) | INTRAVENOUS | Status: AC
  Administered 2018-06-25 – 2018-06-26 (×4): 1000 mg via INTRAVENOUS
  Filled 2018-06-25 (×4): qty 100

## 2018-06-25 MED ORDER — UMECLIDINIUM-VILANTEROL 62.5-25 MCG/INH IN AEPB
1.0000 | INHALATION_SPRAY | Freq: Every day | RESPIRATORY_TRACT | Status: DC
  Administered 2018-06-26: 1 via RESPIRATORY_TRACT
  Filled 2018-06-25: qty 14

## 2018-06-25 MED ORDER — ONDANSETRON HCL 4 MG/2ML IJ SOLN
INTRAMUSCULAR | Status: DC | PRN
  Administered 2018-06-25: 4 mg via INTRAVENOUS

## 2018-06-25 MED ORDER — ONDANSETRON HCL 4 MG PO TABS: 4.0000 mg | ORAL_TABLET | Freq: Four times a day (QID) | ORAL | Status: DC | PRN

## 2018-06-25 MED ORDER — BUPIVACAINE IN DEXTROSE 0.75-8.25 % IT SOLN
INTRATHECAL | Status: DC | PRN
  Administered 2018-06-25: 2 mL via INTRATHECAL

## 2018-06-25 MED ORDER — PHENYLEPHRINE 40 MCG/ML (10ML) SYRINGE FOR IV PUSH (FOR BLOOD PRESSURE SUPPORT)
PREFILLED_SYRINGE | INTRAVENOUS | Status: DC | PRN
  Administered 2018-06-25: 120 ug via INTRAVENOUS
  Administered 2018-06-25: 80 ug via INTRAVENOUS

## 2018-06-25 MED ORDER — BUPIVACAINE-EPINEPHRINE (PF) 0.25% -1:200000 IJ SOLN
INTRAMUSCULAR | Status: DC | PRN
  Administered 2018-06-25: 30 mL via PERINEURAL

## 2018-06-25 MED ORDER — NITROGLYCERIN 0.4 MG SL SUBL: 0.4000 mg | SUBLINGUAL_TABLET | SUBLINGUAL | Status: DC | PRN

## 2018-06-25 MED ORDER — ALUM & MAG HYDROXIDE-SIMETH 200-200-20 MG/5ML PO SUSP: 30.0000 mL | ORAL | Status: DC | PRN

## 2018-06-25 MED ORDER — HYDROCHLOROTHIAZIDE 12.5 MG PO CAPS
12.5000 mg | ORAL_CAPSULE | Freq: Every day | ORAL | Status: DC
  Administered 2018-06-25 – 2018-06-26 (×2): 12.5 mg via ORAL
  Filled 2018-06-25 (×2): qty 1

## 2018-06-25 MED ORDER — PROPOFOL 500 MG/50ML IV EMUL
INTRAVENOUS | Status: DC | PRN
  Administered 2018-06-25: 75 ug/kg/min via INTRAVENOUS

## 2018-06-25 MED ORDER — FENTANYL CITRATE (PF) 100 MCG/2ML IJ SOLN: 25.0000 ug | INTRAMUSCULAR | Status: DC | PRN

## 2018-06-25 MED ORDER — DOCUSATE SODIUM 100 MG PO CAPS
100.0000 mg | ORAL_CAPSULE | Freq: Two times a day (BID) | ORAL | Status: DC
  Administered 2018-06-25 – 2018-06-27 (×4): 100 mg via ORAL
  Filled 2018-06-25 (×4): qty 1

## 2018-06-25 MED ORDER — PHENOL 1.4 % MT LIQD: 1.0000 | OROMUCOSAL | Status: DC | PRN

## 2018-06-25 MED ORDER — ALBUMIN HUMAN 5 % IV SOLN
12.5000 g | Freq: Once | INTRAVENOUS | Status: AC
  Administered 2018-06-25: 12.5 g via INTRAVENOUS

## 2018-06-25 MED ORDER — ISOSORBIDE MONONITRATE ER 60 MG PO TB24
60.0000 mg | ORAL_TABLET | Freq: Every day | ORAL | Status: DC
  Administered 2018-06-25 – 2018-06-27 (×3): 60 mg via ORAL
  Filled 2018-06-25 (×3): qty 1

## 2018-06-25 MED ORDER — MORPHINE SULFATE (PF) 2 MG/ML IV SOLN: 0.5000 mg | INTRAVENOUS | Status: DC | PRN

## 2018-06-25 MED ORDER — DIPHENHYDRAMINE HCL 12.5 MG/5ML PO ELIX
12.5000 mg | ORAL_SOLUTION | ORAL | Status: DC | PRN
  Administered 2018-06-25 – 2018-06-26 (×2): 25 mg via ORAL
  Filled 2018-06-25 (×2): qty 10

## 2018-06-25 MED ORDER — MEPERIDINE HCL 50 MG/ML IJ SOLN: 6.2500 mg | INTRAMUSCULAR | Status: DC | PRN

## 2018-06-25 MED ORDER — LISINOPRIL-HYDROCHLOROTHIAZIDE 20-12.5 MG PO TABS: 1.0000 | ORAL_TABLET | Freq: Every day | ORAL | Status: DC

## 2018-06-25 MED ORDER — ACETAMINOPHEN 10 MG/ML IV SOLN
1000.0000 mg | Freq: Once | INTRAVENOUS | Status: AC
  Administered 2018-06-25: 1000 mg via INTRAVENOUS
  Filled 2018-06-25: qty 100

## 2018-06-25 MED ORDER — SODIUM CHLORIDE 0.9 % IV SOLN: 75.0000 mL/h | INTRAVENOUS | Status: DC

## 2018-06-25 MED ORDER — DOCUSATE SODIUM 100 MG PO CAPS: 100.0000 mg | ORAL_CAPSULE | Freq: Two times a day (BID) | ORAL | Status: DC

## 2018-06-25 MED ORDER — HYDROCODONE-ACETAMINOPHEN 5-325 MG PO TABS
1.0000 | ORAL_TABLET | ORAL | Status: DC | PRN
  Administered 2018-06-25: 2 via ORAL
  Administered 2018-06-25 – 2018-06-27 (×6): 1 via ORAL
  Filled 2018-06-25: qty 2
  Filled 2018-06-25 (×3): qty 1
  Filled 2018-06-25: qty 2
  Filled 2018-06-25: qty 1

## 2018-06-25 MED ORDER — SODIUM CHLORIDE 0.9 % IR SOLN
Status: DC | PRN
  Administered 2018-06-25: 1000 mL

## 2018-06-25 MED ORDER — LISINOPRIL 20 MG PO TABS
20.0000 mg | ORAL_TABLET | Freq: Every day | ORAL | Status: DC
  Administered 2018-06-25 – 2018-06-26 (×2): 20 mg via ORAL
  Filled 2018-06-25 (×2): qty 1

## 2018-06-25 MED ORDER — CHLORHEXIDINE GLUCONATE 4 % EX LIQD: 60.0000 mL | Freq: Once | CUTANEOUS | Status: DC

## 2018-06-25 MED ORDER — PROPOFOL 10 MG/ML IV BOLUS
INTRAVENOUS | Status: AC
  Filled 2018-06-25: qty 20

## 2018-06-25 MED ORDER — SODIUM CHLORIDE 0.9 % IV SOLN
INTRAVENOUS | Status: DC | PRN
  Administered 2018-06-25: 20 ug/min via INTRAVENOUS

## 2018-06-25 MED ORDER — KETOROLAC TROMETHAMINE 15 MG/ML IJ SOLN
7.5000 mg | Freq: Four times a day (QID) | INTRAMUSCULAR | Status: AC
  Administered 2018-06-25 – 2018-06-26 (×4): 7.5 mg via INTRAVENOUS
  Filled 2018-06-25 (×4): qty 1

## 2018-06-25 MED ORDER — MIDAZOLAM HCL 2 MG/2ML IJ SOLN
INTRAMUSCULAR | Status: AC
  Filled 2018-06-25: qty 2

## 2018-06-25 MED ORDER — MENTHOL 3 MG MT LOZG: 1.0000 | LOZENGE | OROMUCOSAL | Status: DC | PRN

## 2018-06-25 SURGICAL SUPPLY — 65 items
ACETAB CUP W GRIPTION 54MM (Plate) ×1 IMPLANT
ACETAB CUP W/GRIPTION 54 (Plate) ×2 IMPLANT
BLADE SAW SAG 73X25 THK (BLADE) ×1
BLADE SAW SGTL 73X25 THK (BLADE) ×2 IMPLANT
COVER SURGICAL LIGHT HANDLE (MISCELLANEOUS) ×3 IMPLANT
COVER WAND RF STERILE (DRAPES) ×3 IMPLANT
CUP ACETAB W/GRIPTION 54 (Plate) ×1 IMPLANT
DECANTER SPIKE VIAL GLASS SM (MISCELLANEOUS) ×3 IMPLANT
DRAPE INCISE IOBAN 66X45 STRL (DRAPES) IMPLANT
DRAPE ORTHO SPLIT 77X108 STRL (DRAPES) ×4
DRAPE SURG ORHT 6 SPLT 77X108 (DRAPES) ×2 IMPLANT
DRSG MEPILEX BORDER 4X12 (GAUZE/BANDAGES/DRESSINGS) ×3 IMPLANT
DRSG MEPILEX BORDER 4X8 (GAUZE/BANDAGES/DRESSINGS) ×3 IMPLANT
DURAPREP 26ML APPLICATOR (WOUND CARE) ×6 IMPLANT
ELECT BLADE 4.0 EZ CLEAN MEGAD (MISCELLANEOUS)
ELECT REM PT RETURN 9FT ADLT (ELECTROSURGICAL) ×3
ELECTRODE BLDE 4.0 EZ CLN MEGD (MISCELLANEOUS) IMPLANT
ELECTRODE REM PT RTRN 9FT ADLT (ELECTROSURGICAL) ×1 IMPLANT
ELIMINATOR HOLE APEX DEPUY (Hips) ×3 IMPLANT
EVACUATOR 1/8 PVC DRAIN (DRAIN) IMPLANT
FACESHIELD WRAPAROUND (MASK) ×9 IMPLANT
GLOVE BIOGEL PI IND STRL 8 (GLOVE) ×2 IMPLANT
GLOVE BIOGEL PI IND STRL 8.5 (GLOVE) ×1 IMPLANT
GLOVE BIOGEL PI INDICATOR 8 (GLOVE) ×4
GLOVE BIOGEL PI INDICATOR 8.5 (GLOVE) ×2
GLOVE ECLIPSE 8.0 STRL XLNG CF (GLOVE) ×6 IMPLANT
GLOVE ECLIPSE 8.5 STRL (GLOVE) ×6 IMPLANT
GOWN STRL REUS W/ TWL LRG LVL3 (GOWN DISPOSABLE) ×2 IMPLANT
GOWN STRL REUS W/TWL 2XL LVL3 (GOWN DISPOSABLE) ×6 IMPLANT
GOWN STRL REUS W/TWL LRG LVL3 (GOWN DISPOSABLE) ×4
HANDPIECE INTERPULSE COAX TIP (DISPOSABLE)
HEAD M SROM 36MM PLUS 1.5 (Hips) ×1 IMPLANT
IMMOBILIZER KNEE 22 (SOFTGOODS) ×3 IMPLANT
IMMOBILIZER KNEE 22 UNIV (SOFTGOODS) ×3 IMPLANT
KIT BASIN OR (CUSTOM PROCEDURE TRAY) ×3 IMPLANT
KIT TURNOVER KIT B (KITS) ×3 IMPLANT
LINER NEUTRAL 52X36X54 PLUS 4 (Liner) ×3 IMPLANT
MANIFOLD NEPTUNE II (INSTRUMENTS) ×3 IMPLANT
NEEDLE 22X1 1/2 (OR ONLY) (NEEDLE) ×3 IMPLANT
NS IRRIG 1000ML POUR BTL (IV SOLUTION) ×3 IMPLANT
PACK TOTAL JOINT (CUSTOM PROCEDURE TRAY) ×3 IMPLANT
PAD ARMBOARD 7.5X6 YLW CONV (MISCELLANEOUS) ×3 IMPLANT
SCREW 6.5MMX25MM (Screw) ×3 IMPLANT
SCREW 6.5MMX35MM (Screw) ×3 IMPLANT
SET HNDPC FAN SPRY TIP SCT (DISPOSABLE) IMPLANT
SROM M HEAD 36MM PLUS 1.5 (Hips) ×3 IMPLANT
STAPLER VISISTAT 35W (STAPLE) IMPLANT
STEM FEM CMNTLSS LG AML 15.0 (Hips) ×3 IMPLANT
SUCTION FRAZIER HANDLE 10FR (MISCELLANEOUS) ×2
SUCTION TUBE FRAZIER 10FR DISP (MISCELLANEOUS) ×1 IMPLANT
SUT BONE WAX W31G (SUTURE) IMPLANT
SUT ETHIBOND NAB CT1 #1 30IN (SUTURE) ×9 IMPLANT
SUT MNCRL AB 3-0 PS2 18 (SUTURE) ×3 IMPLANT
SUT VIC AB 0 CT1 27 (SUTURE) ×4
SUT VIC AB 0 CT1 27XBRD ANBCTR (SUTURE) ×2 IMPLANT
SUT VIC AB 1 CT1 27 (SUTURE) ×4
SUT VIC AB 1 CT1 27XBRD ANBCTR (SUTURE) ×2 IMPLANT
SUT VIC AB 2-0 CT1 27 (SUTURE) ×2
SUT VIC AB 2-0 CT1 TAPERPNT 27 (SUTURE) ×1 IMPLANT
SYR CONTROL 10ML LL (SYRINGE) ×3 IMPLANT
TOWEL OR 17X24 6PK STRL BLUE (TOWEL DISPOSABLE) ×3 IMPLANT
TOWEL OR 17X26 10 PK STRL BLUE (TOWEL DISPOSABLE) ×3 IMPLANT
TRAY CATH 16FR W/PLASTIC CATH (SET/KITS/TRAYS/PACK) IMPLANT
WATER STERILE IRR 1000ML POUR (IV SOLUTION) ×3 IMPLANT
WRAP KNEE MAXI GEL POST OP (GAUZE/BANDAGES/DRESSINGS) ×3 IMPLANT

## 2018-06-25 NOTE — Anesthesia Postprocedure Evaluation (Signed)
Anesthesia Post Note  Patient: [name]  Procedure(s) Performed: RIGHT TOTAL HIP ARTHROPLASTY (Right Hip)     Patient location during evaluation: PACU Anesthesia Type: Spinal Level of consciousness: oriented and awake and alert Pain management: pain level controlled Vital Signs Assessment: post-procedure vital signs reviewed and stable Respiratory status: spontaneous breathing, respiratory function stable, patient connected to nasal cannula oxygen and nonlabored ventilation Cardiovascular status: blood pressure returned to baseline and stable Postop Assessment: no headache, no backache, no apparent nausea or vomiting, patient able to bend at knees and spinal receding Anesthetic complications: no    Last Vitals:  Vitals:   06/25/18 0542 06/25/18 0930  BP: (!) 165/81   Pulse: 100   Resp: 20   Temp: 36.5 C (!) 36.1 C  SpO2: 98%     Last Pain:  Vitals:   06/25/18 1000  PainSc: 0-No pain                 Hazelene Doten A.

## 2018-06-25 NOTE — Progress Notes (Signed)
PATIENT ID:      Maretta LosBillie R Bhavsar  MRN:     [id number] DOB/AGE:    1945-12-07 / 72 y.o.       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   Weight as of this encounter: 84.2 kg.     POSTOPERATIVE DIAGNOSIS:   same                                                                     Estimated body mass index is 30.89 kg/m as calculated from the following:   Height as of this encounter: 5\' 5"  (1.651 m).   Weight as of this encounter: 84.2 kg.     PROCEDURE:  Procedure(s): RIGHT TOTAL HIP ARTHROPLASTY      SURGEON:  Norlene CampbellPeter Yuridia Couts, MD    ASSISTANT:   Jacqualine CodeBrian Petrarca, PA-C   (Present and scrubbed throughout the case, critical for assistance with exposure, retraction, instrumentation, and closure.)          ANESTHESIA: spinal and IV sedation     DRAINS: none :      TOURNIQUET TIME: * No tourniquets in log *    COMPLICATIONS:  None   CONDITION:  stable  PROCEDURE IN [id number]   Valeria Batmaneter W Alessandro Griep 06/25/2018, 8:59 AM  Patient ID: Maretta LosBillie R Jiron, male   DOB: 1945-12-07, 72 y.o.   MRN: [id number]

## 2018-06-25 NOTE — Anesthesia Procedure Notes (Signed)
Spinal  Patient location during procedure: OR Start time: 06/25/2018 7:20 AM End time: 06/25/2018 7:27 AM Staffing Anesthesiologist: Mal AmabileFoster, Gracelee Stemmler, MD Performed: anesthesiologist  Preanesthetic Checklist Completed: patient identified, site marked, surgical consent, pre-op evaluation, timeout performed, IV checked, risks and benefits discussed and monitors and equipment checked Spinal Block Patient position: sitting Prep: site prepped and draped and DuraPrep Patient monitoring: heart rate, cardiac monitor, continuous pulse ox and blood pressure Approach: midline Location: L3-4 Injection technique: single-shot Needle Needle type: Pencan  Needle gauge: 24 G Needle length: 9 cm Needle insertion depth: 6 cm Assessment Sensory level: T4 Additional Notes Patient tolerated procedure well. Adequate sensory level.

## 2018-06-25 NOTE — Evaluation (Signed)
Physical Therapy Evaluation Patient Details Name: [name] MRN: [id number] DOB: 11/22/1945 Today's Date: 06/25/2018   History of Present Illness  Pt is a 72 y.o. male s/p R THA (posterior approach) on 06/25/18. PMH includes L THA (posterior; 02/2018), PAD, COPD (on 4L O2 [place] baseline), HTN, asthma.    Clinical Impression  Pt presents with an overall decrease in functional mobility secondary to above. PTA, pt mod indep ambulating with SPC; wears 4L O2 [place] at baseline. Educ on precautions, positioning, and importance of mobility. Today, pt able to transfer and amb short distance with RW and min guard to minA for mobility. Pt would benefit from continued acute PT services to maximize functional mobility and independence prior to d/c with SNF-level therapies.     Follow Up Recommendations Follow surgeon's recommendation for DC plan and follow-up therapies;Supervision for mobility/OOB;SNF    Equipment Recommendations  (TBD next venue)    Recommendations for Other Services       Precautions / Restrictions Precautions Precautions: Fall;Posterior Hip Precaution Booklet Issued: Yes (comment) Precaution Comments: Verbally reviewed precautions; pt unable to recall at end of session Required Braces or Orthoses: Knee Immobilizer - Right Knee Immobilizer - Right: On except when in CPM Restrictions Weight Bearing Restrictions: Yes RLE Weight Bearing: Weight bearing as tolerated      Mobility  Bed Mobility Overal bed mobility: Needs Assistance Bed Mobility: Supine to Sit     Supine to sit: Supervision     General bed mobility comments: Increased time and effort; supervision for safety, required 1x cues to scoot hips back at EOB to prevent from slipping from edge  Transfers Overall transfer level: Needs assistance Equipment used: Rolling walker (2 wheeled) Transfers: Sit to/from Stand Sit to Stand: Min assist         General transfer comment: MinA to steady upon standing; heavy  reliance on BUE support, using momentum to stand  Ambulation/Gait Ambulation/Gait assistance: Min guard Gait Distance (Feet): 6 Feet Assistive device: Rolling walker (2 wheeled) Gait Pattern/deviations: Step-to pattern;Decreased weight shift to right;Antalgic Gait velocity: Decreased Gait velocity interpretation: <1.8 ft/sec, indicate of risk for recurrent falls General Gait Details: Slow, antalgic amb in room with RW and close min guard for balance. DOE 4/4, cued to not hold breath with mobility. SpO2 down to 89% on 4L O2 [place]. Further distance deferred secondary to pt c/o 11/10 RLE pain  Stairs            Wheelchair Mobility    Modified Rankin (Stroke Patients Only)       Balance Overall balance assessment: Needs assistance   Sitting balance-Leahy Scale: Fair       Standing balance-Leahy Scale: Poor                               Pertinent Vitals/Pain Pain Assessment: Faces Faces Pain Scale: Hurts whole lot Pain Location: R hip Pain Descriptors / Indicators: Grimacing;Guarding;Sore Pain Intervention(s): Monitored during session;Limited activity within patient's tolerance;Repositioned    Home Living Family/patient expects to be discharged to:: Skilled nursing facility Living Arrangements: Children                    Prior Function Level of Independence: Independent with assistive device(s)         Comments: Mod indep ambulation with SPC. Indep with ADLs     Hand Dominance   Dominant Hand: Right    Extremity/Trunk Assessment   Upper  Extremity Assessment Upper Extremity Assessment: Overall WFL for tasks assessed    Lower Extremity Assessment Lower Extremity Assessment: RLE deficits/detail RLE Deficits / Details: s/p R THA; hip flex grossly <3/5, abd 3/5 RLE: Unable to fully assess due to immobilization RLE Coordination: decreased gross motor    Cervical / Trunk Assessment Cervical / Trunk Assessment: Kyphotic  Communication    Communication: HOH  Cognition Arousal/Alertness: Awake/alert Behavior During Therapy: WFL for tasks assessed/performed Overall Cognitive Status: No family/caregiver present to determine baseline cognitive functioning Area of Impairment: Memory                     Memory: Decreased short-term memory         General Comments: WFL for simple tasks. Unable to recall posterior hip precautions at end of session despite education at beginning of session. Likely baseline cognition      General Comments General comments (skin integrity, edema, etc.): SpO2 down to 89% on 4L O2 [place], returning to >98% with seated rest and deep breathing.     Exercises     Assessment/Plan    PT Assessment Patient needs continued PT services  PT Problem List Decreased strength;Decreased range of motion;Decreased activity tolerance;Decreased balance;Decreased mobility;Decreased knowledge of use of DME;Decreased knowledge of precautions;Pain       PT Treatment Interventions DME instruction;Gait training;Stair training;Functional mobility training;Therapeutic activities;Therapeutic exercise;Balance training;Patient/family education    PT Goals (Current goals can be found in the Care Plan section)  Acute Rehab PT Goals Patient Stated Goal: [place] prior to return home PT Goal Formulation: With patient/family Time For Goal Achievement: 07/09/18 Potential to Achieve Goals: Good    Frequency 7X/week   Barriers to discharge Decreased caregiver support      Co-evaluation               AM-PAC PT "6 Clicks" Mobility  Outcome Measure Help needed turning from your back to your side while in a flat bed without using bedrails?: A Little Help needed moving from lying on your back to sitting on the side of a flat bed without using bedrails?: A Little Help needed moving to and from a bed to a chair (including a wheelchair)?: A Little Help needed standing up from a chair using  your arms (e.g., wheelchair or bedside chair)?: A Little Help needed to walk in hospital room?: A Little Help needed climbing 3-5 steps with a railing? : A Lot 6 Click Score: 17    End of Session Equipment Utilized During Treatment: Gait belt;Oxygen Activity Tolerance: Patient tolerated treatment well;Patient limited by pain Patient left: in chair;with call bell/phone within reach Nurse Communication: Mobility status PT Visit Diagnosis: Other abnormalities of gait and mobility (R26.89);Pain Pain - Right/Left: Right Pain - part of body: Hip    Time: [id number] PT Time Calculation (min) (ACUTE ONLY): 31 min   Charges:   PT Evaluation $PT Eval Moderate Complexity: 1 Mod PT Treatments $Therapeutic Activity: 8-22 mins      Ina HomesJaclyn Evann Koelzer, PT, DPT Acute Rehabilitation Services  Pager [phone] Office [phone]  Malachy ChamberJaclyn L Hawthorne Day 06/25/2018, 3:10 PM

## 2018-06-25 NOTE — Transfer of Care (Signed)
Immediate Anesthesia Transfer of Care Note  Patient: [name]  Procedure(s) Performed: RIGHT TOTAL HIP ARTHROPLASTY (Right Hip)  Patient Location: PACU  Anesthesia Type:Spinal  Level of Consciousness: awake, alert  and oriented  Airway & Oxygen Therapy: Patient Spontanous Breathing and Patient connected to face mask oxygen  Post-op Assessment: Report given to RN and Post -op Vital signs reviewed and stable  Post vital signs: Reviewed and stable  Last Vitals:  Vitals Value Taken Time  BP    Temp    Pulse 57 06/25/2018  9:20 AM  Resp 15 06/25/2018  9:20 AM  SpO2 100 % 06/25/2018  9:20 AM  Vitals shown include unvalidated device data.  Last Pain: There were no vitals filed for this visit.       Complications: No apparent anesthesia complications

## 2018-06-25 NOTE — Op Note (Signed)
NAME: [name], [name] MEDICAL RECORD ZS:[id number] ACCOUNT [id number] DATE OF BIRTH:[birth year] FACILITY: [facility] LOCATION: [place], [place]  OPERATIVE REPORT  DATE OF PROCEDURE:  06/25/2018  PREOPERATIVE DIAGNOSIS:  End-stage osteoarthritis, right hip, with avascular necrosis of femoral head.  POSTOPERATIVE DIAGNOSIS:  End-stage osteoarthritis, right hip, with avascular necrosis of femoral head.  PROCEDURE:  Right total hip replacement.  SURGEON:  Joni Fears, MD  ASSISTANT:  Biagio Borg, PA-C  ANESTHESIA:  Spinal with IV sedation.  COMPLICATIONS:  None.  COMPONENTS:  DePuy AML large stature 15 mm femoral component, +1.5 mm neck length with a 36 mm outer diameter metallic hip ball, 54 mm outer diameter Gription 3 acetabular shell with an apex hole eliminator, two 6.5 mm acetabular screws and a Marathon  polyethylene liner +4 with a 10 degree posterior lip.  Components were press-fit.  DESCRIPTION OF PROCEDURE:  The patient was met with a family member in the holding area.  I identified the right hip as the appropriate operative site and marked it accordingly.  Anesthesia then transported the patient to room #7.  He was given a spinal  anesthetic without difficulty.  Placed comfortably on the operating room table in the supine position.  Nursing staff inserted a Foley catheter.  Urine was clear.  The patient was then placed in the lateral decubitus position with the right side up and secured to the operating room table with the Innomed hip system.  The right hip was then prepped with chlorhexidine scrub and DuraPrep x2 from the iliac crest to the  mid calf.  Sterile draping was performed.  Timeout was called.  A routine Southern incision was utilized and via sharp dissection carried down to subcutaneous tissue.  Gross bleeders were Bovie coagulated.  The IT band was identified and incised.  The fibers of the glutei muscles were then separated  manually.  A  self-retraining retractor was inserted.  Any bleeding was controlled with the Bovie.  Short external rotators were identified, the hip internally rotated.  They were then incised from their posterior attachment of the greater trochanter.  Tendinous structures were tagged with 0 Ethibond suture.  Capsule was identified and incised.  There  was a bloody effusion.  The hip was then dislocated posteriorly.  It was misshapen and large flakes of articular cartilage and subchondral bone.  The head was consistent with avascular necrosis.  The acetabulum was cleared of any loose debris.  Retractors were then placed about the femur.  A starter hole was then made, and reaming was performed to 14.5 mm to accept a 15 mm component.  I carefully rasped the canal to a 15 large component.  I used the calcar cutter to obtain the appropriate angle  on the calcar.  Right femoral cut was about a fingerbreadth proximal to the lesser trochanter.  Retractors were then placed about the acetabulum.  I cleared any soft tissue.  I reamed to 53 mm to accept a 54 component.  I trialed the 52 and a 54 component and had excellent rim fit with the 52 and the 54 but could not completely seat the 54  component.  I then proceeded to insert the Gription 3, 54 mm outer diameter acetabular component using the external guide.  Thought I had excellent position with good purchase.  Based on the patient's age and osteopenia, I inserted two 6.5 mm acetabular screws after  appropriate reaming and measuring.  The more posterior screw was carefully positioned.  I could not  feel any penetration through the posterior acetabular wall.  The trial acetabular liner was then inserted followed by the 15 mm large stature femoral rasp.  We trialed the 1.5 mm neck length with a 36 mm outer diameter hip ball.  This was reduced.  Through a full range of motion, there was no instability or  toggling.  I felt the leg lengths were maintained  equal.  Trial components were removed.  The acetabulum was carefully irrigated.  I inserted the apex hole eliminator, followed by the final Marathon polyethylene liner 10-degree posterior lip.  Retractors were placed about the femur.  The 15 mm large stature femoral component was then impacted flush on the calcar.  We cleaned the [facility] taper neck and applied the final 1.5 mm neck length, 36 mm outer diameter hip ball.  We cleared the acetabulum  of any loose material, then reduced the hip.  Again, through a full range of motion there was no toggling or instability.  The wound was irrigated with saline solution.  We applied tranexamic acid topically under compression.  Thought we had a very nice  dry field.  The capsule was closed anatomically with a running 0 Ethibond suture.  The short external rotators were closed with a similar material.  The IT band was closed with a running #1 Vicryl.  The subcu was closed in several layers with 3-0 and  2-0 Vicryl and 3-0 Monocryl.  Skin closed with skin clips.  A sterile bulky dressing was applied.  The patient was then placed in the supine position without any problems.  He was then transported to the postanesthesia recovery room in stable condition.  LN/NUANCE  D:06/25/2018 T:06/25/2018 JOB:[id number]

## 2018-06-25 NOTE — Progress Notes (Signed)
Orthopedic Tech Progress Note Patient Details:  [name] 06/18/1946 [id number]  Patient ID: Tom LosBillie R [name], male   DOB: 06/18/1946, 72 y.o.   MRN: [id number] Therapy says Pt doesn't need OHF and Pt refused OHF.  Pt already has on knee immobilizer  Trinna PostMartinez, [name] 06/25/2018, 2:57 PM

## 2018-06-25 NOTE — Consult Note (Signed)
Medical Consultation   Tom LosBillie R Rundle  WUJ:[id number]  DOB: Oct 14, 1945  DOA: 06/25/2018  PCP: [name], Ashish, MD   Outpatient Specialists:  [facility]field - orthopedics; Vassie LollAlva - pulmonology;  Purvis SheffieldKoneswaran - cardiology; de Marlene BastMason [facility]- [facility]   Requesting physician: Cleophas DunkerWhitfield, orthopedics  Reason for consultation: Total hip replacement, would like management for chronic medical problems; he had respiratory and cardiac problems after his last surgery   History of Present Illness: [name] is an 72 y.o. male with h/o PAD; COPD on 4L home O2; HLD; HTN; and CAD presenting for R hip replacement.  He had hip arthroplasty on the left on 8/13 and had respiratory distress overnight which resolved after a BM.  He reports currently being unable to feel his right foot s/p R hip arthoplasty but is able to move the toes and does not have pain.  He has no other current concerns.   Review of Systems:  ROS As per HPI otherwise 10 point review of systems negative.    Past Medical History: Past Medical History:  Diagnosis Date  . Asthma   . Carotid artery disease (HCC)    50-69% RICA and less than 50% LICA, 07/2011  . COPD (chronic obstructive pulmonary disease) (HCC)   . Coronary atherosclerosis of native coronary artery    Based on abnormal Myoview - inferior scar, 05/2011; LVEF 45-50%, echo, 07/2011  . Essential hypertension, benign   . HLD (hyperlipidemia)   . NSTEMI (non-ST elevated myocardial infarction) (HCC)    12/12, minor  . On home oxygen therapy    4Lnc with exertion  . Peripheral arterial disease (HCC)   . Tobacco abuse     Past Surgical History: Past Surgical History:  Procedure Laterality Date  . ABDOMINAL SURGERY     2008  . COLONOSCOPY    . Right axillary to femoral bypass  3/12   Dr. Hart RochesterLawson  . RIGHT HYDROCELE REPAIR    . Right to left femoral to femoral bypass  3/12   Dr. Hart RochesterLawson  . SIGMOID RESECTION / RECTOPEXY  2005  . skin abscess drainage    .  TOTAL HIP ARTHROPLASTY Left 02/19/2018   Procedure: LEFT TOTAL HIP ARTHROPLASTY;  Surgeon: Valeria BatmanWhitfield, Peter W, MD;  Location: [facility] OR;  Service: Orthopedics;  Laterality: Left;     Allergies:  No Known Allergies   Social History:  reports that he quit smoking about 7 years ago. His smoking use included cigarettes. He started smoking about 58 years ago. He has a 110.00 pack-year smoking history. He has never used smokeless tobacco. He reports that he does not drink alcohol or use drugs.   Family History: Family History  Problem Relation Age of Onset  . Heart attack Father   . Heart attack Mother       Physical Exam: Vitals:   06/25/18 0542 06/25/18 0625 06/25/18 0930  BP: (!) 165/81    Pulse: 100    Resp: 20    Temp: 97.7 F (36.5 C)  (!) 97 F (36.1 C)  SpO2: 98%    Weight:  84.2 kg   Height:  5\' 5"  (1.651 m)     Constitutional: Alert and awake, oriented x3, not in any acute distress. Eyes:  EOMI, irises appear normal, anicteric sclera,  ENMT: external ears and nose appear normal, normal hearing, Lips appear normal, oropharynx mucosa, tongue appear normal, wearing Linganore O2  Neck: neck appears  normal, no masses, normal ROM, no thyromegaly, no JVD  CVS: S1-S2 clear, no murmur rubs or gallops, no LE edema, normal pedal pulses  Respiratory:  clear to auscultation bilaterally, no wheezing, rales or rhonchi. Respiratory effort normal. No accessory muscle use.  Abdomen: soft nontender, nondistended Musculoskeletal: : no cyanosis, clubbing or edema noted bilaterally; there is a clean bandage on the right hip; 2+ distal pulses with TED hose in place B Neuro: Cranial nerves II-XII intact Psych: judgement and insight appear normal, stable mood and affect, mental status   Data reviewed:  I have personally reviewed the recent labs and imaging studies  Pertinent Labs:   Glucose 145 WBC 11.6 Hgb 11.5 Normal UA MRSA PCR negative  Inpatient Medications:   Scheduled Meds: .  chlorhexidine  60 mL Topical Once  . chlorhexidine  60 mL Topical Once   Continuous Infusions: . sodium chloride    . albumin human       Radiological Exams on Admission: Dg Hip Port Unilat With Pelvis 1v Right  Result Date: 06/25/2018 CLINICAL DATA:  Status post right total hip arthroplasty. EXAM: DG HIP (WITH OR WITHOUT PELVIS) 1V PORT RIGHT COMPARISON:  None. FINDINGS: The patient has undergone right hip joint prosthesis placement. There is a pre-existing left hip joint prosthesis. Radiographic positioning of the right prosthetic components is good. The interface with the native bone appears normal. No acute native bone abnormality is observed. IMPRESSION: No immediate complication following right hip joint prosthesis placement. Electronically Signed   By: David  Swaz[place] M.D.   On: 06/25/2018 10:22    Impression/Recommendations Principal Problem:   Status post total replacement of right hip Active Problems:   Essential hypertension, benign   Tobacco abuse   COPD (chronic obstructive pulmonary disease) (HCC)  S/p THR of R hip -Management as per ortho -Appears to be doing well at this time -He was unable to receive insurance authorization in a timely manner with last hospitalization and so did not go to SNF rehab; the plan is for the patient to go to SNF after this procedure  HTN -Continue Lisinopril-HCTZ  HLD -Continue Zocor  Chronic respiratory failure from COPD -Appears to be stable at his usual baseline -Continue home 4L O2 -Continue Anoro -prn Albuterol nebs  Tobacco dependence -Encourage cessation -Offer patch if needed    Thank you for this consultation.  Our [facility] hospitalist team will follow the patient with you.   Time Spent: 45 minutes  Jonah Blue M.D. [facility] 06/25/2018, 10:58 AM

## 2018-06-25 NOTE — Progress Notes (Signed)
The recent History & Physical has been reviewed. I have personally examined the patient today. There is no interval change to the documented History & Physical. The patient would like to proceed with the procedure.  Valeria Batmaneter W Angell Honse 06/25/2018,  7:05 AM  Patient ID: [name], male   DOB: 22-Jul-1945, 72 y.o.   MRN: [id number]

## 2018-06-26 ENCOUNTER — Encounter (HOSPITAL_COMMUNITY): Payer: Self-pay | Admitting: Orthopaedic Surgery

## 2018-06-26 LAB — BASIC METABOLIC PANEL
Anion gap: 11 (ref 5–15)
BUN: 16 mg/dL (ref 8–23)
CO2: 22 mmol/L (ref 22–32)
Calcium: 7.7 mg/dL — ABNORMAL LOW (ref 8.9–10.3)
Chloride: 108 mmol/L (ref 98–111)
Creatinine, Ser: 1.3 mg/dL — ABNORMAL HIGH (ref 0.61–1.24)
GFR calc Af Amer: >60 mL/min (ref >60)
GFR calc non Af Amer: 54 mL/min — ABNORMAL LOW (ref >60)
Glucose, Bld: 126 mg/dL — ABNORMAL HIGH (ref 70–99)
POTASSIUM: 3.3 mmol/L — AB (ref 3.5–5.1)
Sodium: 141 mmol/L (ref 135–145)

## 2018-06-26 LAB — CBC
HCT: 25.3 % — ABNORMAL LOW (ref 39.0–52.0)
Hemoglobin: 7.8 g/dL — ABNORMAL LOW (ref 13.0–17.0)
MCH: 26.6 pg (ref 26.0–34.0)
MCHC: 30.8 g/dL (ref 30.0–36.0)
MCV: 86.3 fL (ref 80.0–100.0)
PLATELETS: 182 10*3/uL (ref 150–400)
RBC: 2.93 MIL/uL — ABNORMAL LOW (ref 4.22–5.81)
RDW: 15.7 % — ABNORMAL HIGH (ref 11.5–15.5)
WBC: 8.2 10*3/uL (ref 4.0–10.5)
nRBC: 0 % (ref 0.0–0.2)

## 2018-06-26 LAB — PREPARE RBC (CROSSMATCH)

## 2018-06-26 MED ORDER — HYDROCHLOROTHIAZIDE 12.5 MG PO CAPS
12.5000 mg | ORAL_CAPSULE | Freq: Every day | ORAL | Status: DC
  Administered 2018-06-26 – 2018-06-27 (×2): 12.5 mg via ORAL
  Filled 2018-06-26: qty 1

## 2018-06-26 MED ORDER — SODIUM CHLORIDE 0.9% IV SOLUTION
Freq: Once | INTRAVENOUS | Status: AC
  Administered 2018-06-26: 15:00:00 via INTRAVENOUS

## 2018-06-26 MED ORDER — LISINOPRIL-HYDROCHLOROTHIAZIDE 20-12.5 MG PO TABS: 1.0000 | ORAL_TABLET | Freq: Every day | ORAL | Status: DC

## 2018-06-26 MED ORDER — POTASSIUM CHLORIDE CRYS ER 20 MEQ PO TBCR
20.0000 meq | EXTENDED_RELEASE_TABLET | Freq: Two times a day (BID) | ORAL | Status: AC
  Administered 2018-06-26 (×2): 20 meq via ORAL
  Filled 2018-06-26 (×2): qty 1

## 2018-06-26 MED ORDER — LISINOPRIL 20 MG PO TABS
20.0000 mg | ORAL_TABLET | Freq: Every day | ORAL | Status: DC
  Administered 2018-06-26 – 2018-06-27 (×2): 20 mg via ORAL
  Filled 2018-06-26 (×2): qty 1

## 2018-06-26 MED ORDER — IPRATROPIUM-ALBUTEROL 0.5-2.5 (3) MG/3ML IN SOLN
3.0000 mL | Freq: Four times a day (QID) | RESPIRATORY_TRACT | Status: DC
  Administered 2018-06-26 (×2): 3 mL via RESPIRATORY_TRACT
  Filled 2018-06-26 (×2): qty 3

## 2018-06-26 NOTE — Progress Notes (Signed)
Being placed on scheduled Duonebs. Ok to [employer] Anoro inhalers per Dr. Renford DillsAdhikari.  Ulyses SouthwardMinh Cathalina Barcia, PharmD, BCIDP, AAHIVP, CPP Infectious Disease Pharmacist 06/26/2018 11:37 AM

## 2018-06-26 NOTE — Progress Notes (Signed)
PROGRESS NOTE    [name]  ZOX:[id number] DOB: 07/28/1945 DOA: 06/25/2018 PCP: Kirstie Peri, MD   Brief Narrative: Patient is a 72 year old male with past medical history of peripheral artery disease, COPD on 4 L oxygen at home, hyperlipidemia, hypertension, coronary disease who was admitted for right total hip arthroplasty by orthopedics.  Medicine was consulted for the management of medical issues. This morning he looked comfortable.  He is on his baseline oxygen requirement.  Assessment & Plan:   Principal Problem:   Status post total replacement of right hip Active Problems:   Essential hypertension, benign   Tobacco abuse   COPD (chronic obstructive pulmonary disease) (HCC)  Total hip arthoplasty of right hip: Management as per Ortho.  Plan is to discharge to skilled nursing facility.  Continue pain management.  Acute blood loss anemia: Hemoglobin dropped from 11.5 to 7.8 today.  Most likely associated with surgery.  Patient is hemodynamically stable.  We will transfuse him with point of PRBC today.  Check CBC tomorrow.  Hypokalemia: Supplemented  Acute kidney injury: Most likely secondary to anemia/volume depletion.  Will transfuse.  Anticipate improvement tomorrow.  Check BMP tomorrow morning.  COPD: Auscultated to have mild expiratory wheezes bilaterally.  Will schedule for DuoNeb nebulization.  Continue home inhalers.  Follows with pulmonology as an outpatient.  Currently on 4 L/min oxygen which is baseline.  Hypertension: Continue current regimen.  Blood pressure stable.  Lipidemia: Continue Zocor  Tobacco dependence: Encouraged for cessation.  Thank you for this consultation.  We will sign off for now.  Please recall if needed.        DVT prophylaxis: SCD Code Status: Full Family Communication: None present at the bedside Disposition Plan: As per orthopedics, likely SNF   Procedures: Total hip replacement  Antimicrobials: None  Subjective: Patient  seen and examined the bedside this morning.  Remains comfortable.  Hemodynamically stable.  No new complaints.  Objective: Vitals:   06/25/18 1358 06/25/18 1958 06/26/18 0505 06/26/18 0755  BP:  (!) 145/59 (!) 136/58   Pulse:  87 79   Resp:  20 16   Temp:  97.7 F (36.5 C) (!) 97.4 F (36.3 C)   TempSrc: Oral Oral Oral   SpO2:  98% 99% 97%  Weight:      Height:        Intake/Output Summary (Last 24 hours) at 06/26/2018 1128 Last data filed at 06/26/2018 0850 Gross per 24 hour  Intake 2343.36 ml  Output 1000 ml  Net 1343.36 ml   Filed Weights   06/25/18 0625  Weight: 84.2 kg    Examination:  General exam: Appears calm and comfortable ,Not in distress,average built HEENT:PERRL,Oral mucosa moist, Ear/Nose normal on gross exam Respiratory system: Bilateral air entry, bilateral expiratory wheezes Cardiovascular system: S1 & S2 heard, RRR. No JVD, murmurs, rubs, gallops or clicks. No pedal edema. Gastrointestinal system: Abdomen is nondistended, soft and nontender. No organomegaly or masses felt. Normal bowel sounds heard. Central nervous system: Alert and oriented. No focal neurological deficits. Extremities: No edema, no clubbing ,no cyanosis, distal peripheral pulses palpable.  Clean surgical scar on the right hip Skin: No rashes, lesions or ulcers,no icterus ,no pallor MSK: Normal muscle bulk,tone ,power Psychiatry: Judgement and insight appear normal. Mood & affect appropriate.     Data Reviewed: I have personally reviewed following labs and imaging studies  CBC: Recent Labs  Lab 06/24/18 1147 06/26/18 0138  WBC 11.6* 8.2  NEUTROABS 8.9*  --   HGB  11.5* 7.8*  HCT 38.3* 25.3*  MCV 87.4 86.3  PLT 273 182   Basic Metabolic Panel: Recent Labs  Lab 06/24/18 1147 06/26/18 0138  NA 142 141  K 3.4* 3.3*  CL 107 108  CO2 25 22  GLUCOSE 145* 126*  BUN 13 16  CREATININE 1.02 1.30*  CALCIUM 8.9 7.7*   GFR: Estimated Creatinine Clearance: 50.5 mL/min (A) (by  C-G formula based on SCr of 1.3 mg/dL (H)). Liver Function Tests: Recent Labs  Lab 06/24/18 1147  AST 34  ALT 22  ALKPHOS 34*  BILITOT 0.4  PROT 7.6  ALBUMIN 3.8   No results for input(s): LIPASE, AMYLASE in the last 168 hours. No results for input(s): AMMONIA in the last 168 hours. Coagulation Profile: Recent Labs  Lab 06/24/18 1147  INR 1.11   Cardiac Enzymes: No results for input(s): CKTOTAL, CKMB, CKMBINDEX, TROPONINI in the last 168 hours. BNP (last 3 results) No results for input(s): PROBNP in the last 8760 hours. HbA1C: No results for input(s): HGBA1C in the last 72 hours. CBG: No results for input(s): GLUCAP in the last 168 hours. Lipid Profile: No results for input(s): CHOL, HDL, LDLCALC, TRIG, CHOLHDL, LDLDIRECT in the last 72 hours. Thyroid Function Tests: No results for input(s): TSH, T4TOTAL, FREET4, T3FREE, THYROIDAB in the last 72 hours. Anemia Panel: No results for input(s): VITAMINB12, FOLATE, FERRITIN, TIBC, IRON, RETICCTPCT in the last 72 hours. Sepsis Labs: No results for input(s): PROCALCITON, LATICACIDVEN in the last 168 hours.  Recent Results (from the past 240 hour(s))  Urine culture     Status: Abnormal   Collection Time: 06/24/18 11:48 AM  Result Value Ref Range Status   Specimen Description URINE, CLEAN CATCH  Final   Special Requests Normal  Final   Culture (A)  Final    <10,000 COLONIES/mL INSIGNIFICANT GROWTH Performed at [facility], [address] [place], [place] [address]    Report Status 06/25/2018 FINAL  Final  Surgical pcr screen     Status: None   Collection Time: 06/24/18 11:49 AM  Result Value Ref Range Status   MRSA, PCR NEGATIVE NEGATIVE Final   Staphylococcus aureus NEGATIVE NEGATIVE Final    Comment: (NOTE) The Xpert SA Assay (FDA approved for NASAL specimens in patients 73 years of age and older), is one component of a comprehensive surveillance program. It is not intended to diagnose infection nor to guide  or monitor treatment. Performed at [facility], [address] [place], [place] [address]          Radiology Studies: Dg Hip Port Unilat With Pelvis 1v Right  Result Date: 06/25/2018 CLINICAL DATA:  Status post right total hip arthroplasty. EXAM: DG HIP (WITH OR WITHOUT PELVIS) 1V PORT RIGHT COMPARISON:  None. FINDINGS: The patient has undergone right hip joint prosthesis placement. There is a pre-existing left hip joint prosthesis. Radiographic positioning of the right prosthetic components is good. The interface with the native bone appears normal. No acute native bone abnormality is observed. IMPRESSION: No immediate complication following right hip joint prosthesis placement. Electronically Signed   By: David  SwazilandJordan M.D.   On: 06/25/2018 10:22        Scheduled Meds: . sodium chloride   Intravenous Once  . aspirin EC  325 mg Oral Q breakfast  . docusate sodium  100 mg Oral BID  . hydrochlorothiazide  12.5 mg Oral Daily  . Influenza vac split quadrivalent PF  0.5 mL Intramuscular Tomorrow-1000  .  isosorbide mononitrate  60 mg Oral Daily  . lisinopril  20 mg Oral Daily  . polyethylene glycol  17 g Oral Daily  . potassium chloride  20 mEq Oral BID  . simvastatin  20 mg Oral QHS  . umeclidinium-vilanterol  1 puff Inhalation Daily   Continuous Infusions:   LOS: 1 day    Time spent: 35 mins.More than 50% of that time was spent in counseling and/or coordination of care.      Burnadette Pop, MD [facility] Pager [phone]  If 7PM-7AM, please contact night-coverage [email or web address] Password [email or web address] 06/26/2018, 11:28 AM

## 2018-06-26 NOTE — [place] FL2 (Signed)
[place] MEDICAID FL2 LEVEL OF CARE SCREENING TOOL     IDENTIFICATION  Patient Name: [name] Birthdate: 04/30/1946 Sex: male Admission Date (Current Location): 06/25/2018  [facility] [place]  [place]   Facility and Address:  [facility] [place], [address], [place], [place] [address]      Provider Number: [id number]  Attending Physician Name and Address:  Valeria BatmanWhitfield, Peter W, MD  Relative Name and Phone Number:  Summer (grandaughter) [phone]    Current Level of Care: Hospital Recommended Level of Care: Skilled Nursing Facility Prior Approval Number:    Date Approved/Denied:   PASRR Number: [id number]  Discharge Plan: SNF    Current Diagnoses: Patient Active Problem List   Diagnosis Date Noted  . Status post total replacement of right hip 06/25/2018  . Pain in right hip 06/19/2018  . Pre-op evaluation 06/07/2018  . Primary osteoarthritis of left hip 02/19/2018  . Avascular necrosis of bone of right hip (HCC) 02/19/2018  . Status post left hip replacement 02/19/2018  . Unilateral primary osteoarthritis, left hip 01/09/2018  . Atherosclerosis of native arteries of the extremities with intermittent claudication 03/30/2014  . Aftercare following surgery of the circulatory system, NEC 03/30/2014  . Muscle cramps- Right  Flank / Sharyne Peachrubk 03/30/2014  . Paresthesia of right foot 03/30/2014  . Chest pain 12/30/2013  . Unstable angina (HCC) 12/29/2013  . Chest pain at rest 12/29/2013  . Chronic respiratory failure (HCC) 12/29/2013  . COPD (chronic obstructive pulmonary disease) (HCC)   . HTN (hypertension), malignant 10/07/2013  . Dyspnea 03/06/2013  . Tobacco abuse   . HLD (hyperlipidemia)   . Peripheral vascular disease, unspecified (HCC) 03/26/2012  . Coronary atherosclerosis of native coronary artery 07/28/2011  . Mixed hyperlipidemia 07/28/2011  . Essential hypertension, benign 07/28/2011  . Peripheral arterial disease  (HCC) 07/28/2011  . Carotid artery disease (HCC) 07/28/2011    Orientation RESPIRATION BLADDER Height & Weight     Self, Time, Situation, Place  O2(nasal cannula 3L/min) Continent Weight: 84.2 kg Height:  5\' 5"  (165.1 cm)  BEHAVIORAL SYMPTOMS/MOOD NEUROLOGICAL BOWEL NUTRITION STATUS      Continent Diet(see discharge summary)  AMBULATORY STATUS COMMUNICATION OF NEEDS Skin   Limited Assist Verbally Surgical wounds(right hip closed surgical incision, left hip closed surgical incision)                       Personal Care Assistance Level of Assistance  Bathing, Feeding, Dressing, Total care Bathing Assistance: Limited assistance Feeding assistance: Independent Dressing Assistance: Limited assistance Total Care Assistance: Limited assistance   Functional Limitations Info  Sight, Hearing, Speech Sight Info: Adequate Hearing Info: Adequate Speech Info: Adequate    SPECIAL CARE FACTORS FREQUENCY  PT (By licensed PT), OT (By licensed OT)     PT Frequency: min 5x weekly OT Frequency: min 3x weekly            Contractures Contractures Info: Not present    Additional Factors Info  Isolation Precautions, Code Status, Allergies Code Status Info: full Allergies Info: no known allergies     Isolation Precautions Info: MRSA     Current Medications (06/26/2018):  This is the current hospital active medication list Current Facility-Administered Medications  Medication Dose Route Frequency Provider Last Rate Last Dose  . albuterol (PROVENTIL) (2.5 MG/3ML) 0.083% nebulizer solution 2.5 mg  2.5 mg Nebulization Q6H PRN Valeria BatmanWhitfield, Peter W, MD      . alum & mag hydroxide-simeth (MAALOX/MYLANTA) [phone]  MG/5ML suspension 30 mL  30 mL Oral Q4H PRN Jetty Peeks, PA-C      . aspirin EC tablet 325 mg  325 mg Oral Q breakfast Jetty Peeks, PA-C   325 mg at 06/26/18 9147  . bisacodyl (DULCOLAX) EC tablet 5 mg  5 mg Oral Daily PRN Jonah Blue, MD      . bisacodyl  (DULCOLAX) suppository 10 mg  10 mg Rectal Daily PRN Jetty Peeks, PA-C      . diphenhydrAMINE (BENADRYL) 12.5 MG/5ML elixir 12.5-25 mg  12.5-25 mg Oral Q4H PRN Jetty Peeks, PA-C   25 mg at 06/25/18 2132  . docusate sodium (COLACE) capsule 100 mg  100 mg Oral BID Jetty Peeks, PA-C   100 mg at 06/26/18 8295  . hydrochlorothiazide (MICROZIDE) capsule 12.5 mg  12.5 mg Oral Daily Valeria Batman, MD   12.5 mg at 06/26/18 0902  . HYDROcodone-acetaminophen (NORCO/VICODIN) 5-325 MG per tablet 1-2 tablet  1-2 tablet Oral Q4H PRN Jetty Peeks, PA-C   1 tablet at 06/26/18 0342  . Influenza vac split quadrivalent PF (FLUZONE HIGH-DOSE) injection 0.5 mL  0.5 mL Intramuscular Tomorrow-1000 Valeria Batman, MD      . isosorbide mononitrate (IMDUR) 24 hr tablet 60 mg  60 mg Oral Daily Jetty Peeks, PA-C   60 mg at 06/26/18 0902  . lisinopril (PRINIVIL,ZESTRIL) tablet 20 mg  20 mg Oral Daily Valeria Batman, MD   20 mg at 06/26/18 0901  . magnesium citrate solution 1 Bottle  1 Bottle Oral Once PRN [name] D, PA-C      . magnesium hydroxide (MILK OF MAGNESIA) suspension 30 mL  30 mL Oral Daily PRN Petrarca, Oris Drone, PA-C      . menthol-cetylpyridinium (CEPACOL) lozenge 3 mg  1 lozenge Oral PRN [name] D, PA-C       Or  . phenol (CHLORASEPTIC) mouth spray 1 spray  1 spray Mouth/Throat PRN Petrarca, Oris Drone, PA-C      . metoCLOPramide (REGLAN) tablet 5-10 mg  5-10 mg Oral Q8H PRN [name] D, PA-C       Or  . metoCLOPramide (REGLAN) injection 5-10 mg  5-10 mg Intravenous Q8H PRN Petrarca, [name] D, PA-C      . morphine 2 MG/ML injection 0.5-1 mg  0.5-1 mg Intravenous Q2H PRN Petrarca, Oris Drone, PA-C      . naphazoline-glycerin (CLEAR EYES REDNESS) ophth solution 1 drop  1 drop Both Eyes QID PRN Petrarca, Oris Drone, PA-C      . nitroGLYCERIN (NITROSTAT) SL tablet 0.4 mg  0.4 mg Sublingual Q5 Min x 3 PRN Petrarca, [name] D, PA-C      . ondansetron (ZOFRAN) tablet 4  mg  4 mg Oral Q6H PRN [name] D, PA-C       Or  . ondansetron (ZOFRAN) injection 4 mg  4 mg Intravenous Q6H PRN Petrarca, [name] D, PA-C      . oxymetazoline (AFRIN) 0.05 % nasal spray 1 spray  1 spray Each Nare PRN Petrarca, Oris Drone, PA-C      . polyethylene glycol (MIRALAX / GLYCOLAX) packet 17 g  17 g Oral Daily Jonah Blue, MD   17 g at 06/25/18 2121  . potassium chloride SA (K-DUR,KLOR-CON) CR tablet 20 mEq  20 mEq Oral BID Valeria Batman, MD   20 mEq at 06/26/18 0902  . simvastatin (ZOCOR) tablet 20 mg  20 mg Oral QHS [name]  D, PA-C   20 mg at 06/25/18 2121  . umeclidinium-vilanterol (ANORO ELLIPTA) 62.5-25 MCG/INH 1 puff  1 puff Inhalation Daily Jetty Peeks, PA-C   1 puff at 06/26/18 7829     Discharge Medications: Please see discharge summary for a list of discharge medications.  Relevant Imaging Results:  Relevant Lab Results:   Additional Information SSN: [id number]  Gildardo Griffes, LCSW

## 2018-06-26 NOTE — Progress Notes (Signed)
Subjective: 1 Day Post-Op Procedure(s) (LRB): RIGHT TOTAL HIP ARTHROPLASTY (Right) Patient reports pain as mild.    Objective: Vital signs in last 24 hours: Temp:  [97 F (36.1 C)-97.8 F (36.6 C)] 97.4 F (36.3 C) (12/18 0505) Pulse Rate:  [54-110] 79 (12/18 0505) Resp:  [13-23] 16 (12/18 0505) BP: (71-161)/(49-100) 136/58 (12/18 0505) SpO2:  [77 %-100 %] 99 % (12/18 0505)  Intake/Output from previous day: 12/17 0701 - 12/18 0700 In: 3403.4 [P.O.:560; I.V.:2203.2; IV Piggyback:490.1] Out: 1350 [Urine:1200; Blood:150] Intake/Output this shift: No intake/output data recorded.  Recent Labs    06/24/18 1147 06/26/18 0138  HGB 11.5* 7.8*   Recent Labs    06/24/18 1147 06/26/18 0138  WBC 11.6* 8.2  RBC 4.38 2.93*  HCT 38.3* 25.3*  PLT 273 182   Recent Labs    06/24/18 1147 06/26/18 0138  NA 142 141  K 3.4* 3.3*  CL 107 108  CO2 25 22  BUN 13 16  CREATININE 1.02 1.30*  GLUCOSE 145* 126*  CALCIUM 8.9 7.7*   Recent Labs    06/24/18 1147  INR 1.11    Neurologically intact No cellulitis present Compartment soft  Dressing dry right hip  Assessment/Plan: 1 Day Post-Op Procedure(s) (LRB): RIGHT TOTAL HIP ARTHROPLASTY (Right) Advance diet Up with therapy Discharge to SNF in next 1-2 days Saline lock IV, replace K   Valeria Batmaneter W Kayhan Boardley 06/26/2018, 7:20 AM

## 2018-06-26 NOTE — Progress Notes (Signed)
Physical Therapy Treatment Patient Details Name: [name] MRN: [id number] DOB: [birth year] Today's Date: 06/26/2018    History of Present Illness Pt is a 72 y.o. male s/p R THA (posterior approach) on 06/25/18. PMH includes L THA (posterior; 02/2018), PAD, COPD (on 4L O2 [place] baseline), HTN, asthma.    PT Comments    Patient seen for mobility progression. Pt is pleasant and eager to mobilize. Pt is making progress toward PT goals and tolerated increased gait training distance this session. Pt is able to ambulate 60 ft with RW and min guard for safety before requiring rest break due to 2/4 DOE. SpO2 desat to 87% on 4L O2 via [place] while ambulating but up to 90% or > with rest and deep breathing. Pt able to recall 3/3 posterior hip precautions end of session. Continue to progress as tolerated.    Follow Up Recommendations  Follow surgeon's recommendation for DC plan and follow-up therapies;Supervision for mobility/OOB;SNF     Equipment Recommendations  (TBD next venue)    Recommendations for Other Services       Precautions / Restrictions Precautions Precautions: Fall;Posterior Hip Precaution Comments: pt recalled 2/3 precautions beginning of session and 3/3 end of session Restrictions Weight Bearing Restrictions: Yes RLE Weight Bearing: Weight bearing as tolerated    Mobility  Bed Mobility Overal bed mobility: Needs Assistance Bed Mobility: Supine to Sit     Supine to sit: Min guard     General bed mobility comments: min guard for safety and to ensure posterior hip precautions are maintained; use of rail and HOB elevated  Transfers Overall transfer level: Needs assistance Equipment used: Rolling walker (2 wheeled) Transfers: Sit to/from Stand Sit to Stand: Min assist         General transfer comment: assist to power up into standing; cues for safe hand placement and then positioning prior to sitting to maintain 90 degree or < hip  flexion  Ambulation/Gait Ambulation/Gait assistance: Min guard Gait Distance (Feet): (60 ft X 2 with rest break required due to 2/4 DOE) Assistive device: Rolling walker (2 wheeled) Gait Pattern/deviations: Decreased weight shift to right;Antalgic;Step-through pattern;Decreased stride length Gait velocity: Decreased   General Gait Details: cues for posture, breathing technique, and increased bilat step lengths; SpO2 desat to 87% on 4L O2 via [place] while ambulating but up to 90% with rest break and deep breathing    Stairs             Wheelchair Mobility    Modified Rankin (Stroke Patients Only)       Balance Overall balance assessment: Needs assistance   Sitting balance-Leahy Scale: Good       Standing balance-Leahy Scale: Poor                              Cognition Arousal/Alertness: Awake/alert Behavior During Therapy: WFL for tasks assessed/performed Overall Cognitive Status: Within Functional Limits for tasks assessed                                        Exercises      General Comments        Pertinent Vitals/Pain Pain Assessment: Faces Faces Pain Scale: Hurts little more Pain Location: R hip/thigh Pain Descriptors / Indicators: Grimacing;Guarding;Sore Pain Intervention(s): Limited activity within patient's tolerance;Monitored during session;Premedicated before session;Repositioned    Home Living  Prior Function            PT Goals (current goals can now be found in the care plan section) Acute Rehab PT Goals Patient Stated Goal: Rehab at SNF (hopefully near [place]) prior to return home Progress towards PT goals: Progressing toward goals    Frequency    7X/week      PT Plan Current plan remains appropriate    Co-evaluation              AM-PAC PT "6 Clicks" Mobility   Outcome Measure  Help needed turning from your back to your side while in a flat bed without using  bedrails?: A Little Help needed moving from lying on your back to sitting on the side of a flat bed without using bedrails?: A Lot Help needed moving to and from a bed to a chair (including a wheelchair)?: A Little Help needed standing up from a chair using your arms (e.g., wheelchair or bedside chair)?: A Little Help needed to walk in hospital room?: A Little Help needed climbing 3-5 steps with a railing? : A Lot 6 Click Score: 16    End of Session Equipment Utilized During Treatment: Gait belt;Oxygen Activity Tolerance: Patient tolerated treatment well Patient left: in chair;with call bell/phone within reach Nurse Communication: Mobility status PT Visit Diagnosis: Other abnormalities of gait and mobility (R26.89);Pain Pain - Right/Left: Right Pain - part of body: Hip     Time: 1034-1100 PT Time Calculation (min) (ACUTE ONLY): 26 min  Charges:  $Gait Training: 23-37 mins                     Erline Levine, PTA Acute Rehabilitation Services Pager: [phone] Office: [phone]     Carolynne Edouard 06/26/2018, 11:18 AM

## 2018-06-26 NOTE — Clinical Social Work Note (Signed)
Clinical Social Work Assessment  Patient Details  Name: [name] MRN: [id number] Date of Birth: 1946/06/09  Date of referral:  06/26/18               Reason for consult:  Discharge Planning                Permission sought to share information with:  Case Manager, Facility Medical sales representativeContact Representative, Family Supports Permission granted to share information::  Yes, Verbal Permission Granted  Name::     [name]  Agency::  SNFs  Relationship::  daughter  Contact Information:  [phone]  Housing/Transportation Living arrangements for the past 2 months:  Single Family Home Source of Information:  Patient Patient Interpreter Needed:  None Criminal Activity/Legal Involvement Pertinent to Current Situation/Hospitalization:  No - Comment as needed Significant Relationships:  Adult Children Lives with:  Self Do you feel safe going back to the place where you live?  No Need for family participation in patient care:  Yes (Comment)  Care giving concerns:  CSW received referral for possible SNF placement at time of discharge. Spoke with patient's daughter regarding possibility of SNF placement . Patient's family   is currently unable to care for him at their home given patient's current needs and fall risk.  Patient and daughter expressed understanding of PT recommendation and are agreeable to SNF placement at time of discharge. CSW to continue to follow and assist with discharge planning needs.     Social Worker assessment / plan:  Spoke with patient's daughter concerning [facility] before returning home.    Employment status:  Retired Database administratornsurance information:  Managed Medicare PT Recommendations:  Skilled Nursing Facility Information / Referral to community resources:  Skilled Nursing Facility  Patient/Family's Response to care:  Patient and family  recognize need for rehab before returning home and are agreeable to a SNF. [facility] and  daughter reports she has contacted facility to secure patient's spot . CSW explained insurance authorization process. Patient's family reported that they want patient to get stronger to be able to come back home. CSW provided medicare.gov ratings of facility to patient and paper file in chart.    Patient/Family's Understanding of and Emotional Response to Diagnosis, Current Treatment, and Prognosis:  Patient/family is realistic regarding therapy needs and expressed being hopeful for SNF placement. Patient expressed understanding of CSW role and discharge process as well as medical condition. No questions/concerns about plan or treatment.    Emotional Assessment Appearance:  Appears stated age Attitude/Demeanor/Rapport:  Unable to Assess Affect (typically observed):  Unable to Assess Orientation:  Oriented to Self, Oriented to Place, Oriented to  Time, Oriented to Situation Alcohol / Substance use:  Not Applicable Psych involvement (Current and /or in the community):  No (Comment)  Discharge Needs  Concerns to be addressed:  Discharge Planning Concerns Readmission within the last 30 days:  No Current discharge risk:  Dependent with Mobility Barriers to Discharge:  Continued Medical Work up   Dyneg[place]hley M Christan Ciccarelli, LCSW 06/26/2018, 10:06 AM

## 2018-06-27 LAB — CBC
HCT: 30 % — ABNORMAL LOW (ref 39.0–52.0)
HEMOGLOBIN: 9.6 g/dL — AB (ref 13.0–17.0)
MCH: 27.5 pg (ref 26.0–34.0)
MCHC: 32 g/dL (ref 30.0–36.0)
MCV: 86 fL (ref 80.0–100.0)
Platelets: 198 10*3/uL (ref 150–400)
RBC: 3.49 MIL/uL — ABNORMAL LOW (ref 4.22–5.81)
RDW: 15.6 % — ABNORMAL HIGH (ref 11.5–15.5)
WBC: 11.5 10*3/uL — ABNORMAL HIGH (ref 4.0–10.5)
nRBC: 0 % (ref 0.0–0.2)

## 2018-06-27 LAB — BASIC METABOLIC PANEL
Anion gap: 7 (ref 5–15)
BUN: 14 mg/dL (ref 8–23)
CO2: 25 mmol/L (ref 22–32)
Calcium: 8.3 mg/dL — ABNORMAL LOW (ref 8.9–10.3)
Chloride: 109 mmol/L (ref 98–111)
Creatinine, Ser: 1.08 mg/dL (ref 0.61–1.24)
GFR calc Af Amer: >60 mL/min (ref >60)
GFR calc non Af Amer: >60 mL/min (ref >60)
Glucose, Bld: 119 mg/dL — ABNORMAL HIGH (ref 70–99)
POTASSIUM: 4.2 mmol/L (ref 3.5–5.1)
Sodium: 141 mmol/L (ref 135–145)

## 2018-06-27 MED ORDER — ASPIRIN 325 MG PO TBEC: 325.0000 mg | DELAYED_RELEASE_TABLET | Freq: Two times a day (BID) | ORAL | 0 refills | Status: AC

## 2018-06-27 MED ORDER — IPRATROPIUM-ALBUTEROL 0.5-2.5 (3) MG/3ML IN SOLN
3.0000 mL | Freq: Three times a day (TID) | RESPIRATORY_TRACT | Status: DC
  Administered 2018-06-27 (×2): 3 mL via RESPIRATORY_TRACT
  Filled 2018-06-27 (×2): qty 3

## 2018-06-27 MED ORDER — HYDROCODONE-ACETAMINOPHEN 5-325 MG PO TABS: 1.0000 | ORAL_TABLET | ORAL | 0 refills | Status: AC | PRN

## 2018-06-27 NOTE — Clinical Social Work Placement (Signed)
   CLINICAL SOCIAL WORK PLACEMENT  NOTE  Date:  06/27/2018  Patient Details  Name: [name] MRN: [id number] Date of Birth: 11/05/1945  Clinical Social Work is seeking post-discharge placement for this patient at the Skilled  Nursing Facility level of care (*CSW will initial, date and re-position this form in  chart as items are completed):      Patient/family provided with [facility] Clinical Social Work Department's list of facilities offering this level of care within the geographic area requested by the patient (or if unable, by the patient's family).  Yes   Patient/family informed of their freedom to choose among providers that offer the needed level of care, that participate in Medicare, Medicaid or managed care program needed by the patient, have an available bed and are willing to accept the patient.      Patient/family informed of [place]'s ownership interest in [facility] and [facility], as well as of the fact that they are under no obligation to receive care at these facilities.  PASRR submitted to EDS on       PASRR number received on 06/26/18     Existing PASRR number confirmed on       FL2 transmitted to all facilities in geographic area requested by pt/family on 06/26/18     FL2 transmitted to all facilities within larger geographic area on       Patient informed that his/her managed care company has contracts with or will negotiate with certain facilities, including the following:        Yes   Patient/family informed of bed offers received.  Patient chooses bed at Other - please specify in the comment section below:([facility])     Physician recommends and patient chooses bed at      Patient to be transferred to Other - please specify in the comment section below:([facility]) on 06/27/18.  Patient to be transferred to facility by PTAR     Patient family notified on 06/27/18 of transfer.  Name of family member notified:  [name]      PHYSICIAN       Additional Comment:    _______________________________________________ Gildardo GriffesAshley M Milika Ventress, LCSW 06/27/2018, 3:34 PM

## 2018-06-27 NOTE — Progress Notes (Signed)
Subjective: 2 Days Post-Op Procedure(s) (LRB): RIGHT TOTAL HIP ARTHROPLASTY (Right) Patient reports pain as mild and moderate.    Objective: Vital signs in last 24 hours: Temp:  [97.6 F (36.4 C)-98.9 F (37.2 C)] 98 F (36.7 C) (12/19 1517) Pulse Rate:  [97-114] 112 (12/19 1517) Resp:  [16-20] 16 (12/19 1517) BP: (101-154)/(66-89) 101/66 (12/19 1517) SpO2:  [92 %-99 %] 94 % (12/19 1517)  Intake/Output from previous day: 12/18 0701 - 12/19 0700 In: 1055.3 [P.O.:720; I.V.:20.3; Blood:315] Out: 1775 [Urine:1775] Intake/Output this shift: Total I/O In: 480 [P.O.:480] Out: 400 [Urine:400]  Recent Labs    06/26/18 0138 06/27/18 0225  HGB 7.8* 9.6*   Recent Labs    06/26/18 0138 06/27/18 0225  WBC 8.2 11.5*  RBC 2.93* 3.49*  HCT 25.3* 30.0*  PLT 182 198   Recent Labs    06/26/18 0138 06/27/18 0225  NA 141 141  K 3.3* 4.2  CL 108 109  CO2 22 25  BUN 16 14  CREATININE 1.30* 1.08  GLUCOSE 126* 119*  CALCIUM 7.7* 8.3*   No results for input(s): LABPT, INR in the last 72 hours.  Sensation intact distally Intact pulses distally Dorsiflexion/Plantar flexion intact Incision: dressing C/D/I and no drainage    Assessment/Plan: 2 Days Post-Op Procedure(s) (LRB): RIGHT TOTAL HIP ARTHROPLASTY (Right) Advance diet Up with therapy Discharge to SNF    [facility] Bon[facility] Mostek 06/27/2018, 3:22 PM

## 2018-06-27 NOTE — Discharge Summary (Signed)
Tom Campbell, MD   Tom Code, PA-C [address], [place], [place]  [address]                             [phone]  PATIENT ID: [name]        MRN:  [id number]          DOB/AGE: 10-Jul-1946 / 72 y.o.    DISCHARGE SUMMARY  ADMISSION DATE:    06/25/2018 DISCHARGE DATE:   06/27/2018   ADMISSION DIAGNOSIS: right hip osteoarthritis    DISCHARGE DIAGNOSIS:  right hip osteoarthritis    ADDITIONAL DIAGNOSIS: Principal Problem:   Status post total replacement of right hip Active Problems:   Essential hypertension, benign   Tobacco abuse   COPD (chronic obstructive pulmonary disease) (HCC)  Past Medical History:  Diagnosis Date  . Asthma   . Carotid artery disease (HCC)    50-69% RICA and less than 50% LICA, 07/2011  . COPD (chronic obstructive pulmonary disease) (HCC)   . Coronary atherosclerosis of native coronary artery    Based on abnormal Myoview - inferior scar, 05/2011; LVEF 45-50%, echo, 07/2011  . Essential hypertension, benign   . HLD (hyperlipidemia)   . NSTEMI (non-ST elevated myocardial infarction) (HCC)    12/12, minor  . On home oxygen therapy    4Lnc with exertion  . Peripheral arterial disease (HCC)   . Tobacco abuse     PROCEDURE: Procedure(s): RIGHT TOTAL HIP ARTHROPLASTY  on 06/25/2018  CONSULTS: none    HISTORY: [name], 72 y.o. male, has a history of pain and functional disability in the right hip(s) due to arthritis and patient has failed non-surgical conservative treatments for greater than 12 weeks to include NSAID's and/or analgesics, use of assistive devices, weight reduction as appropriate and activity modification.  Onset of symptoms was gradual starting 3 years ago with gradually worsening course since that time.The patient noted no past surgery on the right hip(s).  Patient currently rates pain in the right hip at 8 out of 10 with activity. Patient has night pain, worsening of pain with activity and weight bearing,  trendelenberg gait, pain that interfers with activities of daily living and pain with passive range of motion. Patient has evidence of subchondral cysts, subchondral sclerosis, periarticular osteophytes, joint space narrowing and femoral head collapse by imaging studies. This condition presents safety issues increasing the risk of falls.There is no current active infection.  HOSPITAL COURSE:  [name] is a 72 y.o. admitted on 06/25/2018 and found to have a diagnosis of right hip osteoarthritis.  After appropriate laboratory studies were obtained  they were taken to the operating room on 06/25/2018 and underwent  Procedure(s): RIGHT TOTAL HIP ARTHROPLASTY  .   They were given perioperative antibiotics:  Anti-infectives (From admission, onward)   Start     Dose/Rate Route Frequency Ordered Stop   06/25/18 1400  ceFAZolin (ANCEF) IVPB 2g/100 mL premix     2 g 200 mL/hr over 30 Minutes Intravenous Every 6 hours 06/25/18 1256 06/26/18 0455   06/25/18 0600  ceFAZolin (ANCEF) IVPB 2g/100 mL premix     2 g 200 mL/hr over 30 Minutes Intravenous On call to O.R. 06/25/18 2130 06/25/18 0759    .  Tolerated the procedure well.  Placed with a foley intraoperatively.    Toradol was given post op.  POD #1, allowed out of bed to a chair.  PT for ambulation and  exercise program.  Foley D/C'd in morning.  IV saline locked.  O2 discontionued.  POD #2, continued PT and ambulation.  Had abdominal cramping but had BM and symptom resolved .  The remainder of the hospital course was dedicated to ambulation and strengthening.   The patient was discharged on 2 Days Post-Op in  Stable condition.  Blood products given:none  DIAGNOSTIC STUDIES: Recent vital signs:  Patient Vitals for the past 24 hrs:  BP Temp Temp src Pulse Resp SpO2  06/27/18 1517 101/66 98 F (36.7 C) Oral (!) 112 16 94 %  06/27/18 1410 - - - - - 92 %  06/27/18 0815 - - - - - 99 %  06/27/18 0443 (!) 154/75 98.3 F (36.8 C) Oral 98 19  99 %  06/26/18 2055 (!) 147/70 98.9 F (37.2 C) Oral (!) 114 20 94 %  06/26/18 2055 - - - - - 96 %  06/26/18 1715 (!) 154/89 97.6 F (36.4 C) Oral 97 17 95 %       Recent laboratory studies: Recent Labs    06/24/18 1147 06/26/18 0138 06/27/18 0225  WBC 11.6* 8.2 11.5*  HGB 11.5* 7.8* 9.6*  HCT 38.3* 25.3* 30.0*  PLT 273 182 198   Recent Labs    06/24/18 1147 06/26/18 0138 06/27/18 0225  NA 142 141 141  K 3.4* 3.3* 4.2  CL 107 108 109  CO2 25 22 25   BUN 13 16 14   CREATININE 1.02 1.30* 1.08  GLUCOSE 145* 126* 119*  CALCIUM 8.9 7.7* 8.3*   Lab Results  Component Value Date   INR 1.11 06/24/2018   INR 1.18 02/01/2018   INR 1.13 12/29/2013     Recent Radiographic Studies :  Dg Chest 2 View  Result Date: 05/30/2018 CLINICAL DATA:  Routine. No complaints. Hx of asthma, COPD, HTN. Ex smoker. EXAM: CHEST - 2 VIEW COMPARISON:  02/21/2018 FINDINGS: Heart size is normal. Hyperinflation and paucity of lung markings in the apices, consistent with emphysema. There is chronic atelectasis or scarring at the lung bases. No focal consolidation or pleural effusion. No pulmonary edema. There is atherosclerotic calcification of the thoracic aorta. Stable, a numerous UPPER thoracic wedge compression fractures. IMPRESSION: 1. Emphysema. 2.  No evidence for acute  abnormality. 3.  Aortic atherosclerosis.  (ICD10-I70.0) 4. Thoracic wedge compression fractures. Electronically Signed   By: Norva Pavlov M.D.   On: 05/30/2018 12:49   Dg Hip Port Unilat With Pelvis 1v Right  Result Date: 06/25/2018 CLINICAL DATA:  Status post right total hip arthroplasty. EXAM: DG HIP (WITH OR WITHOUT PELVIS) 1V PORT RIGHT COMPARISON:  None. FINDINGS: The patient has undergone right hip joint prosthesis placement. There is a pre-existing left hip joint prosthesis. Radiographic positioning of the right prosthetic components is good. The interface with the native bone appears normal. No acute native bone  abnormality is observed. IMPRESSION: No immediate complication following right hip joint prosthesis placement. Electronically Signed   By: David  [place] M.D.   On: 06/25/2018 10:22   Xr Hip Unilat W Or W/o Pelvis 2-3 Views Right  Result Date: 06/19/2018 AP the pelvis demonstrates left total hip replacement in good position.  No complications.  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  Have some loose bodies in the joint   DISCHARGE INSTRUCTIONS: Discharge Instructions    Call MD / Call 911   Complete by:  As directed  If you experience chest pain or shortness of breath, CALL 911 and be transported to the hospital emergency room.  If you develope a fever above 101 F, pus (white drainage) or increased drainage or redness at the wound, or calf pain, call your surgeon's office.   Change dressing   Complete by:  As directed    DO NOT REMOVE OR CHANGE DRESSING   Constipation Prevention   Complete by:  As directed    Drink plenty of fluids.  Prune juice may be helpful.  You may use a stool softener, such as Colace (over the counter) 100 mg twice a day.  Use MiraLax (over the counter) for constipation as needed.   Diet - low sodium heart healthy   Complete by:  As directed    Discharge instructions   Complete by:  As directed    INSTRUCTIONS AFTER JOINT REPLACEMENT   Remove items at home which could result in a fall. This includes throw rugs or furniture in walking pathways ICE to the affected joint every three hours while awake for 30 minutes at a time, for at least the first 3-5 days, and then as needed for pain and swelling.  Continue to use ice for pain and swelling. You may notice swelling that will progress down to the foot and ankle.  This is normal after surgery.  Elevate your leg when you are not up walking on it.   Continue to use the breathing machine you got in the hospital (incentive spirometer) which  will help keep your temperature down.  It is common for your temperature to cycle up and down following surgery, especially at night when you are not up moving around and exerting yourself.  The breathing machine keeps your lungs expanded and your temperature down.   DIET:  As you were doing prior to hospitalization, we recommend a well-balanced diet.  DRESSING / WOUND CARE / SHOWERING  Keep the surgical dressing until follow up.  The dressing is water proof, so you can shower without any extra covering.  IF THE DRESSING FALLS OFF or the wound gets wet inside, change the dressing with sterile gauze.  Please use good hand washing techniques before changing the dressing.  Do not use any lotions or creams on the incision until instructed by your surgeon.    ACTIVITY  Increase activity slowly as tolerated, but follow the weight bearing instructions below.   No driving for 6 weeks or until further direction given by your physician.  You cannot drive while taking narcotics.  No lifting or carrying greater than 10 lbs. until further directed by your surgeon. Avoid periods of inactivity such as sitting longer than an hour when not asleep. This helps prevent blood clots.  You may return to work once you are authorized by your doctor.     WEIGHT BEARING   Weight bearing as tolerated with assist device (walker, cane, etc) as directed, use it as long as suggested by your surgeon or therapist, typically at least 4-6 weeks.   EXERCISES  Results after joint replacement surgery are often greatly improved when you follow the exercise, range of motion and muscle strengthening exercises prescribed by your doctor. Safety measures are also important to protect the joint from further injury. Any time any of these exercises cause you to have increased pain or swelling, decrease what you are doing until you are comfortable again and then slowly increase them. If you have problems or questions, call your caregiver  or physical  therapist for advice.   Rehabilitation is important following a joint replacement. After just a few days of immobilization, the muscles of the leg can become weakened and shrink (atrophy).  These exercises are designed to build up the tone and strength of the thigh and leg muscles and to improve motion. Often times heat used for twenty to thirty minutes before working out will loosen up your tissues and help with improving the range of motion but do not use heat for the first two weeks following surgery (sometimes heat can increase post-operative swelling).   These exercises can be done on a training (exercise) mat, on the floor, on a table or on a bed. Use whatever works the best and is most comfortable for you.    Use music or television while you are exercising so that the exercises are a pleasant break in your day. This will make your life better with the exercises acting as a break in your routine that you can look forward to.   Perform all exercises about fifteen times, three times per day or as directed.  You should exercise both the operative leg and the other leg as well.   Exercises include:  Quad Sets - Tighten up the muscle on the front of the thigh (Quad) and hold for 5-10 seconds.   Straight Leg Raises - With your knee straight (if you were given a brace, keep it on), lift the leg to 60 degrees, hold for 3 seconds, and slowly lower the leg.  Perform this exercise against resistance later as your leg gets stronger.  Leg Slides: Lying on your back, slowly slide your foot toward your buttocks, bending your knee up off the floor (only go as far as is comfortable). Then slowly slide your foot back down until your leg is flat on the floor again.  Angel Wings: Lying on your back spread your legs to the side as far apart as you can without causing discomfort.  Hamstring Strength:  Lying on your back, push your heel against the floor with your leg straight by tightening up the muscles of  your buttocks.  Repeat, but this time bend your knee to a comfortable angle, and push your heel against the floor.  You may put a pillow under the heel to make it more comfortable if necessary.   A rehabilitation program following joint replacement surgery can speed recovery and prevent re-injury in the future due to weakened muscles. Contact your doctor or a physical therapist for more information on knee rehabilitation.    CONSTIPATION  Constipation is defined medically as fewer than three stools per week and severe constipation as less than one stool per week.  Even if you have a regular bowel pattern at home, your normal regimen is likely to be disrupted due to multiple reasons following surgery.  Combination of anesthesia, postoperative narcotics, change in appetite and fluid intake all can affect your bowels.   YOU MUST use at least one of the following options; they are listed in order of increasing strength to get the job done.  They are all available over the counter, and you may need to use some, POSSIBLY even all of these options:    Drink plenty of fluids (prune juice may be helpful) and high fiber foods Colace 100 mg by mouth twice a day  Senokot for constipation as directed and as needed Dulcolax (bisacodyl), take with full glass of water  Miralax (polyethylene glycol) once or twice a day as needed.  If you have tried all these things and are unable to have a bowel movement in the first 3-4 days after surgery call either your surgeon or your primary doctor.    If you experience loose stools or diarrhea, hold the medications until you stool forms back up.  If your symptoms do not get better within 1 week or if they get worse, check with your doctor.  If you experience "the worst abdominal pain ever" or develop nausea or vomiting, please contact the office immediately for further recommendations for treatment.   ITCHING:  If you experience itching with your medications, try taking  only a single pain pill, or even half a pain pill at a time.  You can also use Benadryl over the counter for itching or also to help with sleep.   TED HOSE STOCKINGS:  Use stockings on both legs until for at least 2 weeks or as directed by physician office. They may be removed at night for sleeping.  MEDICATIONS:  See your medication summary on the "After Visit Summary" that nursing will review with you.  You may have some home medications which will be placed on hold until you complete the course of blood thinner medication.  It is important for you to complete the blood thinner medication as prescribed.  PRECAUTIONS:  If you experience chest pain or shortness of breath - call 911 immediately for transfer to the hospital emergency department.   If you develop a fever greater that 101 F, purulent drainage from wound, increased redness or drainage from wound, foul odor from the wound/dressing, or calf pain - CONTACT YOUR SURGEON.                                                   FOLLOW-UP APPOINTMENTS:  If you do not already have a post-op appointment, please call the office for an appointment to be seen by your surgeon.  Guidelines for how soon to be seen are listed in your "After Visit Summary", but are typically between 1-4 weeks after surgery.  OTHER INSTRUCTIONS:   Knee Replacement:  Do not place pillow under knee, focus on keeping the knee straight while resting. CPM instructions: 0-90 degrees, 2 hours in the morning, 2 hours in the afternoon, and 2 hours in the evening. Place foam block, curve side up under heel at all times except when in CPM or when walking.  DO NOT modify, tear, cut, or change the foam block in any way.  MAKE SURE YOU:  Understand these instructions.  Get help right away if you are not doing well or get worse.    Thank you for letting us be a part of your medical care team.  It is a privilege we respect greatly.  We hope these instructions will help you stay on track  for a fast and full recovery!   Driving restrictions   Complete by:  As directed    No driving for 6 weeks   Follow the hip precautions as taught in Physical Therapy   Complete by:  As directed    Increase activity slowly as tolerated   Complete by:  As directed    Lifting restrictions   Complete by:  As directed    No lifting for 6 weeks   Patient may shower   Complete by:  As directed  You may shower over the brown dressing.  DO NOT REMOVE the brown dressing   TED hose   Complete by:  As directed    Use stockings (TED hose) for 4 weeks on RIGHT leg(s).  You may remove them at night for sleeping.   Weight bearing as tolerated   Complete by:  As directed    Laterality:  right   Extremity:  Lower      DISCHARGE MEDICATIONS:   Allergies as of 06/27/2018   No Known Allergies     Medication List    TAKE these medications   ANORO ELLIPTA 62.5-25 MCG/INH Aepb Generic drug:  umeclidinium-vilanterol Inhale 1 puff daily into the lungs.   aspirin 325 MG EC tablet Take 1 tablet (325 mg total) by mouth 2 (two) times daily at 8 am and 10 pm.   HYDROcodone-acetaminophen 5-325 MG tablet Commonly known as:  NORCO/VICODIN Take 1-2 tablets by mouth every 4 (four) hours as needed for moderate pain (pain score 4-6).   isosorbide mononitrate 60 MG 24 hr tablet Commonly known as:  IMDUR Take 1 tablet (60 mg total) by mouth daily.   lisinopril-hydrochlorothiazide 20-12.5 MG tablet Commonly known as:  PRINZIDE,ZESTORETIC Take 1 tablet by mouth daily.   nitroGLYCERIN 0.4 MG SL tablet Commonly known as:  NITROSTAT Place 1 tablet (0.4 mg total) under the tongue every 5 (five) minutes x 3 doses as needed for chest pain.   oxymetazoline 0.05 % nasal spray Commonly known as:  AFRIN Place 1 spray into both nostrils as needed for congestion.   PROAIR HFA 108 (90 Base) MCG/ACT inhaler Generic drug:  albuterol Inhale 2 puffs into the lungs every 6 (six) hours as needed for shortness of  breath.   albuterol (2.5 MG/3ML) 0.083% nebulizer solution Commonly known as:  PROVENTIL Take 2.5 mg by nebulization every 6 (six) hours as needed for shortness of breath.   simvastatin 20 MG tablet Commonly known as:  ZOCOR TAKE 1 TABLET BY MOUTH EVERY DAY What changed:  when to take this   VISINE 0.05 % ophthalmic solution Generic drug:  tetrahydrozoline Place 1 drop into both eyes daily as needed (for dry eyes).            Durable Medical Equipment  (From admission, onward)         Start     Ordered   06/25/18 1257  DME Walker rolling  Once    Question:  Patient needs a walker to treat with the following condition  Answer:  History of total hip arthroplasty, right   06/25/18 1256   06/25/18 1257  DME 3 n 1  Once     06/25/18 1256   06/25/18 1257  DME Bedside commode  Once    Question:  Patient needs a bedside commode to treat with the following condition  Answer:  Status post total replacement of right hip   06/25/18 1256           Discharge Care Instructions  (From admission, onward)         Start     Ordered   06/27/18 0000  Weight bearing as tolerated    Question Answer Comment  Laterality right   Extremity Lower      06/27/18 1529   06/27/18 0000  Change dressing    Comments:  DO NOT REMOVE OR CHANGE DRESSING   06/27/18 1529          FOLLOW UP VISIT:   Contact information for after-discharge  care    [facility]erred [facility] .   Service:  Skilled Nursing Contact information: [address] [place] [phone]              DISPOSITION:   Skilled Nursing Facility/Rehab  CONDITION:  Stable   Oris DroneBrian D. Aleda Granaetrarca, PA-C [facility] [phone]  06/27/2018 3:33 PM

## 2018-06-27 NOTE — Progress Notes (Signed)
Physical Therapy Treatment Patient Details Name: [name] MRN: [id number] DOB: 01-10-1946 Today's Date: 06/27/2018    History of Present Illness Pt is a 72 y.o. male s/p R THA (posterior approach) on 06/25/18. PMH includes L THA (posterior; 02/2018), PAD, COPD (on 4L O2 [place] baseline), HTN, asthma.    PT Comments    Patient seen for mobility progression. Pt eager to mobilize.  Pt's mobility limited grossly by 3/4 DOE with ambulation. Pt tolerated gait training for 50 ft X 2 trials with seated rest break. O2 up to 6L via [place] while ambulating to maintain SpO2 >90%. Continue to progress as tolerated with anticipated d/c to SNF for further skilled PT services.    Follow Up Recommendations  Follow surgeon's recommendation for DC plan and follow-up therapies;Supervision for mobility/OOB;SNF     Equipment Recommendations  (TBD next venue)    Recommendations for Other Services       Precautions / Restrictions Precautions Precautions: Fall;Posterior Hip Precaution Comments: pt recalled 3/3 precautions  Restrictions Weight Bearing Restrictions: Yes RLE Weight Bearing: Weight bearing as tolerated    Mobility  Bed Mobility Overal bed mobility: Needs Assistance Bed Mobility: Supine to Sit     Supine to sit: Min guard     General bed mobility comments: min guard for safety and to ensure posterior hip precautions are maintained; use of rail and HOB elevated  Transfers Overall transfer level: Needs assistance Equipment used: Rolling walker (2 wheeled) Transfers: Sit to/from Stand Sit to Stand: Min assist         General transfer comment: assist to power up and to steady from EOB , BSC, and recliner   Ambulation/Gait Ambulation/Gait assistance: Min guard Gait Distance (Feet): (50 ft X 2 trials with seated rest break due to 3/4 DOE) Assistive device: Rolling walker (2 wheeled) Gait Pattern/deviations: Decreased weight shift to right;Antalgic;Step-through pattern;Decreased  stride length;Decreased step length - right;Decreased step length - left Gait velocity: Decreased   General Gait Details: cues for posture, proximity to RW, breathing technique, and increased bilat step lengths   Stairs             Wheelchair Mobility    Modified Rankin (Stroke Patients Only)       Balance Overall balance assessment: Needs assistance   Sitting balance-Leahy Scale: Good       Standing balance-Leahy Scale: Poor Standing balance comment: requires at least single UE support for static standing balance and bilat for dynamic                            Cognition Arousal/Alertness: Awake/alert Behavior During Therapy: WFL for tasks assessed/performed Overall Cognitive Status: Within Functional Limits for tasks assessed                                 General Comments: pt becomes slightly impulsive when feeling very SOB       Exercises      General Comments General comments (skin integrity, edema, etc.): pt urinated frequently and had bowel movement during session; difficult to get good readings of SpO2 unless seated; SpO2 92% on 6L O2 via [place] after short distance gait to bathroom; pt with increased coughting and phlem noted during session vs yesterday's session      Pertinent Vitals/Pain Pain Assessment: Faces Faces Pain Scale: Hurts little more Pain Location: R hip/thigh and some abdominal pain  Pain  Descriptors / Indicators: Guarding;Sore Pain Intervention(s): Limited activity within patient's tolerance;Monitored during session;Repositioned    Home Living                      Prior Function            PT Goals (current goals can now be found in the care plan section) Acute Rehab PT Goals Patient Stated Goal: Rehab [place]F [place] prior to return home Progress towards PT goals: Progressing toward goals    Frequency    7X/week      PT Plan Current plan remains appropriate     Co-evaluation              AM-PAC PT "6 Clicks" Mobility   Outcome Measure  Help needed turning from your back to your side while in a flat bed without using bedrails?: A Little Help needed moving from lying on your back to sitting on the side of a flat bed without using bedrails?: A Lot Help needed moving to and from a bed to a chair (including a wheelchair)?: A Little Help needed standing up from a chair using your arms (e.g., wheelchair or bedside chair)?: A Little Help needed to walk in hospital room?: A Little Help needed climbing 3-5 steps with a railing? : A Lot 6 Click Score: 16    End of Session Equipment Utilized During Treatment: Gait belt;Oxygen Activity Tolerance: Other (comment)(limited by DOE) Patient left: in chair;with call bell/phone within reach Nurse Communication: Mobility status PT Visit Diagnosis: Other abnormalities of gait and mobility (R26.89);Pain Pain - Right/Left: Right Pain - part of body: Hip     Time: 1350-1430 PT Time Calculation (min) (ACUTE ONLY): 40 min  Charges:  $Gait Training: 23-37 mins                     [name], PTA Acute Rehabilitation Services Pager: [phone] Office: [phone]     [name] 06/27/2018, 3:41 PM

## 2018-06-27 NOTE — Progress Notes (Signed)
CSW received notification from [facility] that insurance authorization has been approved.   CSW reached out to Dr. Hoy RegisterWhitfield's PA Arlys JohnBrian regarding patient's medical readiness to [facility]. PA reports patient not medically ready today. CSW notified [facility].   CSW will continue to follow for when patient medically ready to dc. Patient family is hopeful of patient discharging tomorrow before the weekend.   [place]ley Carlin [place], [place] [phone]

## 2018-06-27 NOTE — Progress Notes (Signed)
Patient will DC to:[facility] Anticipated DC date: 06/27/18 Family notified:[name] Transport by: Sharin MonsPTAR  Per MD patient [facility]. RN, patient, patient's family, and facility notified of DC. Discharge Summary sent to facility. RN given number for report [phone] . DC packet on chart. Ambulance transport requested for patient.  CSW signing off.  [place]ley Sephi[place]th [place], [place] [phone]

## 2018-06-27 NOTE — Progress Notes (Signed)
RN called and gave report to [facility],RN at [facility]. PT aware awaiting PTAR

## 2018-07-02 LAB — BPAM RBC
Blood Product Expiration Date: 202001102359
Blood Product Expiration Date: 202001102359
ISSUE DATE / TIME: 201912181432
UNIT TYPE AND RH: 6200
Unit Type and Rh: 6200

## 2018-07-02 LAB — TYPE AND SCREEN
ABO/RH(D): A POS
Antibody Screen: POSITIVE
Donor AG Type: NEGATIVE
Donor AG Type: NEGATIVE
Unit division: 0
Unit division: 0

## 2018-07-17 ENCOUNTER — Ambulatory Visit (INDEPENDENT_AMBULATORY_CARE_PROVIDER_SITE_OTHER): Payer: Medicare Other | Admitting: Orthopaedic Surgery

## 2018-07-17 ENCOUNTER — Encounter (INDEPENDENT_AMBULATORY_CARE_PROVIDER_SITE_OTHER): Payer: Self-pay | Admitting: Orthopaedic Surgery

## 2018-07-17 ENCOUNTER — Ambulatory Visit (INDEPENDENT_AMBULATORY_CARE_PROVIDER_SITE_OTHER): Payer: Medicare Other

## 2018-07-17 VITALS — BP 99/52 | HR 93 | Ht 65.0 in | Wt 185.0 lb

## 2018-07-17 DIAGNOSIS — Z96641 Presence of right artificial hip joint: Secondary | ICD-10-CM

## 2018-07-17 NOTE — Progress Notes (Signed)
Office Visit Note   Patient: [name]           Date of Birth: [birth year]           MRN: [id number] Visit Date: 07/17/2018              Requested by: [name], Ashish, MD [address] [place], [place] [id number] PCP: [name], Ashish, MD   Assessment & Plan: Visit Diagnoses:  1. Status post total hip replacement, right     Plan: 3 weeks status post primary right total hip replacement and doing quite well.  Still at rehab.  Has had an ongoing issue with COPD and oxygen need.  Incision healing without problem.  X-rays reveal excellent position of the components.  May weight-bear as tolerated and I will plan to see him back in a month Follow-Up Instructions: Return in about 1 month (around 08/17/2018).   Orders:  Orders Placed This Encounter  Procedures  . XR HIP UNILAT W OR W/O PELVIS 2-3 VIEWS RIGHT   No orders of the defined types were placed in this encounter.     Procedures: No procedures performed   Clinical Data: No additional findings.   Subjective: Chief Complaint  Patient presents with  . Right Hip - Routine Post Op    06/25/18 Right THA  Patient returns for first post op visit. He is status post right total hip arthroplasty on 06/25/18.  He states that he is doing well. He is ambulating with a cane. He is not taking pain medication. He states that he does not have much pain.  Rehab is predominately based on his need for oxygen from chronic COPD.  HPI  Review of Systems   Objective: Vital Signs: BP (!) 99/52   Pulse 93   Ht 5\' 5"  (1.651 m)   Wt 185 lb (83.9 kg)   BMI 30.79 kg/m   Physical Exam  Ortho Exam awake alert and oriented x3.  Comfortable sitting.  Right hip incision healing without problem.  Clips removed and Steri-Strips applied.  Neurologically appears to be intact.  Painless range of motion of the hip.  Specialty Comments:  No specialty comments available.  Imaging: Xr Hip Unilat W Or W/o Pelvis 2-3 Views Right  Result Date: 07/17/2018 The  pelvis was obtained for evaluation of the right total hip replacement performed 3 weeks ago.  The components appear to be in excellent position.  No complications    PMFS History: Patient Active Problem List   Diagnosis Date Noted  . Status post total replacement of right hip 06/25/2018  . Pain in right hip 06/19/2018  . Pre-op evaluation 06/07/2018  . Primary osteoarthritis of left hip 02/19/2018  . Avascular necrosis of bone of right hip (HCC) 02/19/2018  . Status post left hip replacement 02/19/2018  . Unilateral primary osteoarthritis, left hip 01/09/2018  . Atherosclerosis of native arteries of the extremities with intermittent claudication 03/30/2014  . Aftercare following surgery of the circulatory system, NEC 03/30/2014  . Muscle cramps- Right  Flank / Sharyne Peachrubk 03/30/2014  . Paresthesia of right foot 03/30/2014  . Chest pain 12/30/2013  . Unstable angina (HCC) 12/29/2013  . Chest pain at rest 12/29/2013  . Chronic respiratory failure (HCC) 12/29/2013  . COPD (chronic obstructive pulmonary disease) (HCC)   . HTN (hypertension), malignant 10/07/2013  . Dyspnea 03/06/2013  . Tobacco abuse   . HLD (hyperlipidemia)   . Peripheral vascular disease, unspecified (HCC) 03/26/2012  . Coronary atherosclerosis of native coronary artery  07/28/2011  . Mixed hyperlipidemia 07/28/2011  . Essential hypertension, benign 07/28/2011  . Peripheral arterial disease (HCC) 07/28/2011  . Carotid artery disease (HCC) 07/28/2011   Past Medical History:  Diagnosis Date  . Asthma   . Carotid artery disease (HCC)    50-69% RICA and less than 50% LICA, 07/2011  . COPD (chronic obstructive pulmonary disease) (HCC)   . Coronary atherosclerosis of native coronary artery    Based on abnormal Myoview - inferior scar, 05/2011; LVEF 45-50%, echo, 07/2011  . Essential hypertension, benign   . HLD (hyperlipidemia)   . NSTEMI (non-ST elevated myocardial infarction) (HCC)    12/12, minor  . On home oxygen  therapy    4Lnc with exertion  . Peripheral arterial disease (HCC)   . Tobacco abuse     Family History  Problem Relation Age of Onset  . Heart attack Father   . Heart attack Mother     Past Surgical History:  Procedure Laterality Date  . ABDOMINAL SURGERY     2008  . COLONOSCOPY    . Right axillary to femoral bypass  3/12   Dr. Hart Rochester  . RIGHT HYDROCELE REPAIR    . Right to left femoral to femoral bypass  3/12   Dr. Hart Rochester  . SIGMOID RESECTION / RECTOPEXY  2005  . skin abscess drainage    . TOTAL HIP ARTHROPLASTY Left 02/19/2018   Procedure: LEFT TOTAL HIP ARTHROPLASTY;  Surgeon: Valeria Batman, MD;  Location: [facility] OR;  Service: Orthopedics;  Laterality: Left;  . TOTAL HIP ARTHROPLASTY Right 06/25/2018   Procedure: RIGHT TOTAL HIP ARTHROPLASTY;  Surgeon: Valeria Batman, MD;  Location: [facility] OR;  Service: Orthopedics;  Laterality: Right;   Social History   Occupational History  . Not on file  Tobacco Use  . Smoking status: Former Smoker    Packs/day: 2.00    Years: 55.00    Pack years: 110.00    Types: Cigarettes    Start date: 12/03/1959    Last attempt to quit: 12/09/2010    Years since quitting: 7.6  . Smokeless tobacco: Never Used  . Tobacco comment: Patient has not somked since midnight on wednesday.   Substance and Sexual Activity  . Alcohol use: No    Alcohol/week: 0.0 standard drinks  . Drug use: No  . Sexual activity: Not on file

## 2018-08-06 ENCOUNTER — Telehealth (INDEPENDENT_AMBULATORY_CARE_PROVIDER_SITE_OTHER): Payer: Self-pay | Admitting: Orthopaedic Surgery

## 2018-08-06 NOTE — Telephone Encounter (Signed)
LMOM

## 2018-08-06 NOTE — Telephone Encounter (Signed)
Please advise 

## 2018-08-06 NOTE — Telephone Encounter (Signed)
Adrienne, Physical Therapist at [facility] left a voicemail requesting verbal orders for patient to receive physical therapy at home 2 times/week for 4 weeks.  Please call back at #[phone]

## 2018-08-06 NOTE — Telephone Encounter (Signed)
OK AS OUTLINED

## 2018-08-07 ENCOUNTER — Telehealth (INDEPENDENT_AMBULATORY_CARE_PROVIDER_SITE_OTHER): Payer: Self-pay | Admitting: Orthopaedic Surgery

## 2018-08-07 NOTE — Telephone Encounter (Signed)
LMOM for Adrienne at [facility] on 08/06/2018, [facility] for [facility] also on 08/07/2018

## 2018-08-07 NOTE — Telephone Encounter (Signed)
Malorie from [facility] left a voicemail requesting home health OT orders for [employer].  Please call back at [phone]

## 2018-08-08 ENCOUNTER — Telehealth: Payer: Self-pay | Admitting: *Deleted

## 2018-08-08 NOTE — Telephone Encounter (Signed)
Ok to make referral

## 2018-08-08 NOTE — Telephone Encounter (Signed)
Not sure why he needs orders for OT???

## 2018-08-08 NOTE — Telephone Encounter (Signed)
Patient has orders for PT. Patient needs orders for OT. Please advise. Thank you.

## 2018-08-08 NOTE — Telephone Encounter (Signed)
OT for Strengthening, endurance, home safety, SOB, meal prep, oxygen use, use restroom

## 2018-09-04 ENCOUNTER — Encounter (INDEPENDENT_AMBULATORY_CARE_PROVIDER_SITE_OTHER): Payer: Self-pay | Admitting: Orthopaedic Surgery

## 2018-09-04 ENCOUNTER — Ambulatory Visit (INDEPENDENT_AMBULATORY_CARE_PROVIDER_SITE_OTHER): Payer: Medicare Other | Admitting: Orthopaedic Surgery

## 2018-09-04 VITALS — BP 103/58 | HR 83 | Ht 65.0 in | Wt 188.0 lb

## 2018-09-04 DIAGNOSIS — Z96642 Presence of left artificial hip joint: Secondary | ICD-10-CM

## 2018-09-04 DIAGNOSIS — Z96643 Presence of artificial hip joint, bilateral: Secondary | ICD-10-CM

## 2018-09-04 DIAGNOSIS — Z96641 Presence of right artificial hip joint: Secondary | ICD-10-CM

## 2018-09-04 NOTE — Progress Notes (Signed)
Office Visit Note   Patient: [name]           Date of Birth: 03/30/1946           MRN: [id number] Visit Date: 09/04/2018              Requested by: Kirstie Peri, MD [address] [place], [place] [address] PCP: Kirstie Peri, MD   Assessment & Plan: Visit Diagnoses:  1. Status post right hip replacement   2. Status post left hip replacement     Plan: Just over 2 months status post right total hip replacement about 6 months status post left total hip replacement.  Doing very well and very happy with the results.  Uses a cane for balance but does not take any pain medicines.  Presently at home and independent.  Will urge him to continue with his exercises and see him in 6 months  Follow-Up Instructions: Return in about 6 months (around 03/05/2019).   Orders:  No orders of the defined types were placed in this encounter.  No orders of the defined types were placed in this encounter.     Procedures: No procedures performed   Clinical Data: No additional findings.   Subjective: Chief Complaint  Patient presents with  . Right Hip - Routine Post Op    Right THA DOS 06/25/2018  Mr. Hennesy is just over 2 months status post primary right total hip replacement in about 6 months status post left total hip replacement.  He is very happy with the results and does not take any pain pills.  He uses a cane for balance.  HPI  Review of Systems   Objective: Vital Signs: BP (!) 103/58   Pulse 83   Ht 5\' 5"  (1.651 m)   Wt 188 lb (85.3 kg)   BMI 31.28 kg/m   Physical Exam  Ortho Exam awake alert and oriented x3.  Comfortable sitting.  I had him walk and he does not have a limp.  Leg lengths appear to be symmetrical.  Motor exam intact.  And over the incisions.  Painless range of motion both hips Specialty Comments:  No specialty comments available.  Imaging: No results found.   PMFS History: Patient Active Problem List   Diagnosis Date Noted  . Status post total  replacement of right hip 06/25/2018  . Pain in right hip 06/19/2018  . Pre-op evaluation 06/07/2018  . Primary osteoarthritis of left hip 02/19/2018  . Avascular necrosis of bone of right hip (HCC) 02/19/2018  . Status post right hip replacement 02/19/2018  . Unilateral primary osteoarthritis, left hip 01/09/2018  . Atherosclerosis of native arteries of the extremities with intermittent claudication 03/30/2014  . Aftercare following surgery of the circulatory system, NEC 03/30/2014  . Muscle cramps- Right  Flank / Sharyne Peach 03/30/2014  . Paresthesia of right foot 03/30/2014  . Chest pain 12/30/2013  . Unstable angina (HCC) 12/29/2013  . Chest pain at rest 12/29/2013  . Chronic respiratory failure (HCC) 12/29/2013  . COPD (chronic obstructive pulmonary disease) (HCC)   . HTN (hypertension), malignant 10/07/2013  . Dyspnea 03/06/2013  . Tobacco abuse   . HLD (hyperlipidemia)   . Peripheral vascular disease, unspecified (HCC) 03/26/2012  . Coronary atherosclerosis of native coronary artery 07/28/2011  . Mixed hyperlipidemia 07/28/2011  . Essential hypertension, benign 07/28/2011  . Peripheral arterial disease (HCC) 07/28/2011  . Carotid artery disease (HCC) 07/28/2011   Past Medical History:  Diagnosis Date  . Asthma   .  Carotid artery disease (HCC)    50-69% RICA and less than 50% LICA, 07/2011  . COPD (chronic obstructive pulmonary disease) (HCC)   . Coronary atherosclerosis of native coronary artery    Based on abnormal Myoview - inferior scar, 05/2011; LVEF 45-50%, echo, 07/2011  . Essential hypertension, benign   . HLD (hyperlipidemia)   . NSTEMI (non-ST elevated myocardial infarction) (HCC)    12/12, minor  . On home oxygen therapy    4Lnc with exertion  . Peripheral arterial disease (HCC)   . Tobacco abuse     Family History  Problem Relation Age of Onset  . Heart attack Father   . Heart attack Mother     Past Surgical History:  Procedure Laterality Date  . ABDOMINAL  SURGERY     2008  . COLONOSCOPY    . Right axillary to femoral bypass  3/12   Dr. Hart Rochester  . RIGHT HYDROCELE REPAIR    . Right to left femoral to femoral bypass  3/12   Dr. Hart Rochester  . SIGMOID RESECTION / RECTOPEXY  2005  . skin abscess drainage    . TOTAL HIP ARTHROPLASTY Left 02/19/2018   Procedure: LEFT TOTAL HIP ARTHROPLASTY;  Surgeon: Valeria Batman, MD;  Location: [facility] OR;  Service: Orthopedics;  Laterality: Left;  . TOTAL HIP ARTHROPLASTY Right 06/25/2018   Procedure: RIGHT TOTAL HIP ARTHROPLASTY;  Surgeon: Valeria Batman, MD;  Location: [facility] OR;  Service: Orthopedics;  Laterality: Right;   Social History   Occupational History  . Not on file  Tobacco Use  . Smoking status: Former Smoker    Packs/day: 2.00    Years: 55.00    Pack years: 110.00    Types: Cigarettes    Start date: 12/03/1959    Last attempt to quit: 12/09/2010    Years since quitting: 7.7  . Smokeless tobacco: Never Used  . Tobacco comment: Patient has not somked since midnight on wednesday.   Substance and Sexual Activity  . Alcohol use: No    Alcohol/week: 0.0 standard drinks  . Drug use: No  . Sexual activity: Not on file     Valeria Batman, MD   Note - This record has been created using [employer].  Chart creation errors have been sought, but may not always  have been located. Such creation errors do not reflect on  the standard of medical care.

## 2018-09-04 NOTE — Progress Notes (Deleted)
Office Visit Note   Patient: [name]           Date of Birth: 11/30/1945           MRN: [id number] Visit Date: 09/04/2018              Requested by: Kirstie Peri, MD [address] [place], [place] [address] PCP: Kirstie Peri, MD   Assessment & Plan: Visit Diagnoses: No diagnosis found.  Plan: ***  Follow-Up Instructions: No follow-ups on file.   Orders:  No orders of the defined types were placed in this encounter.  No orders of the defined types were placed in this encounter.     Procedures: No procedures performed   Clinical Data: No additional findings.   Subjective: Chief Complaint  Patient presents with  . Right Hip - Routine Post Op    Right THA DOS 06/25/2018  Patient presents today for a follow up on his right total hip arthoplasty. He is now 10 weeks postop and doing well. No complaints.  HPI  Review of Systems   Objective: Vital Signs: BP (!) 103/58   Pulse 83   Ht 5\' 5"  (1.651 m)   Wt 188 lb (85.3 kg)   BMI 31.28 kg/m   Physical Exam  Ortho Exam  Specialty Comments:  No specialty comments available.  Imaging: No results found.   PMFS History: Patient Active Problem List   Diagnosis Date Noted  . Status post total replacement of right hip 06/25/2018  . Pain in right hip 06/19/2018  . Pre-op evaluation 06/07/2018  . Primary osteoarthritis of left hip 02/19/2018  . Avascular necrosis of bone of right hip (HCC) 02/19/2018  . Status post left hip replacement 02/19/2018  . Unilateral primary osteoarthritis, left hip 01/09/2018  . Atherosclerosis of native arteries of the extremities with intermittent claudication 03/30/2014  . Aftercare following surgery of the circulatory system, NEC 03/30/2014  . Muscle cramps- Right  Flank / Sharyne Peach 03/30/2014  . Paresthesia of right foot 03/30/2014  . Chest pain 12/30/2013  . Unstable angina (HCC) 12/29/2013  . Chest pain at rest 12/29/2013  . Chronic respiratory failure (HCC) 12/29/2013  .  COPD (chronic obstructive pulmonary disease) (HCC)   . HTN (hypertension), malignant 10/07/2013  . Dyspnea 03/06/2013  . Tobacco abuse   . HLD (hyperlipidemia)   . Peripheral vascular disease, unspecified (HCC) 03/26/2012  . Coronary atherosclerosis of native coronary artery 07/28/2011  . Mixed hyperlipidemia 07/28/2011  . Essential hypertension, benign 07/28/2011  . Peripheral arterial disease (HCC) 07/28/2011  . Carotid artery disease (HCC) 07/28/2011   Past Medical History:  Diagnosis Date  . Asthma   . Carotid artery disease (HCC)    50-69% RICA and less than 50% LICA, 07/2011  . COPD (chronic obstructive pulmonary disease) (HCC)   . Coronary atherosclerosis of native coronary artery    Based on abnormal Myoview - inferior scar, 05/2011; LVEF 45-50%, echo, 07/2011  . Essential hypertension, benign   . HLD (hyperlipidemia)   . NSTEMI (non-ST elevated myocardial infarction) (HCC)    12/12, minor  . On home oxygen therapy    4Lnc with exertion  . Peripheral arterial disease (HCC)   . Tobacco abuse     Family History  Problem Relation Age of Onset  . Heart attack Father   . Heart attack Mother     Past Surgical History:  Procedure Laterality Date  . ABDOMINAL SURGERY     2008  . COLONOSCOPY    .  Right axillary to femoral bypass  3/12   Dr. Hart Rochester  . RIGHT HYDROCELE REPAIR    . Right to left femoral to femoral bypass  3/12   Dr. Hart Rochester  . SIGMOID RESECTION / RECTOPEXY  2005  . skin abscess drainage    . TOTAL HIP ARTHROPLASTY Left 02/19/2018   Procedure: LEFT TOTAL HIP ARTHROPLASTY;  Surgeon: Valeria Batman, MD;  Location: [facility] OR;  Service: Orthopedics;  Laterality: Left;  . TOTAL HIP ARTHROPLASTY Right 06/25/2018   Procedure: RIGHT TOTAL HIP ARTHROPLASTY;  Surgeon: Valeria Batman, MD;  Location: [facility] OR;  Service: Orthopedics;  Laterality: Right;   Social History   Occupational History  . Not on file  Tobacco Use  . Smoking status: Former Smoker     Packs/day: 2.00    Years: 55.00    Pack years: 110.00    Types: Cigarettes    Start date: 12/03/1959    Last attempt to quit: 12/09/2010    Years since quitting: 7.7  . Smokeless tobacco: Never Used  . Tobacco comment: Patient has not somked since midnight on wednesday.   Substance and Sexual Activity  . Alcohol use: No    Alcohol/week: 0.0 standard drinks  . Drug use: No  . Sexual activity: Not on file

## 2018-09-11 ENCOUNTER — Ambulatory Visit: Payer: Medicare Other | Admitting: Cardiovascular Disease

## 2018-11-14 IMAGING — CT CT CHEST W/O CM
2 of 3 series · 15 of 36 positions shown, 18 images · non-contrast
Comparison: 12/27/2017, 12/11/2017 and 03/10/2017 chest x-ray.
12/11/2015 chest CT.

CLINICAL DATA: 72-year-old hypertensive male with abnormal chest
x-ray. Denies chest issues currently. COPD. Former smoker.
Subsequent encounter.

EXAM:
CT CHEST WITHOUT CONTRAST
TECHNIQUE: Multidetector CT imaging of the chest was performed following the
standard protocol without IV contrast.

[Series 2: thorax · axial · 0.75mm/px · z∈[+1933,+2169]mm · 12 of 140 slices shown, 15 images]
[im 11/140  mediastinal]
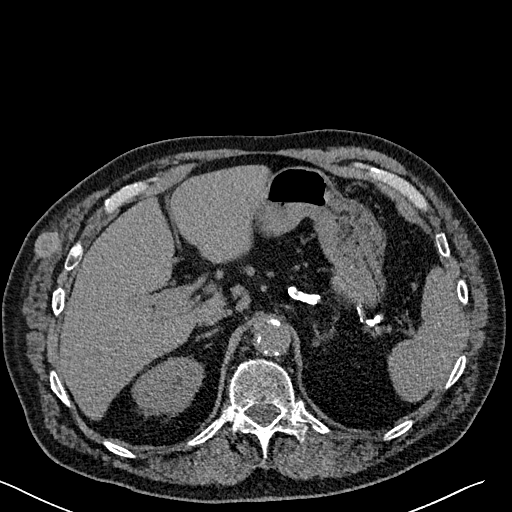
[im 11/140  lung]
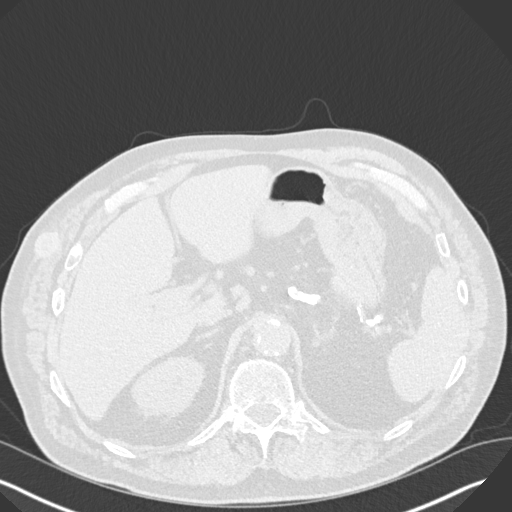
[im 21/140  lung]
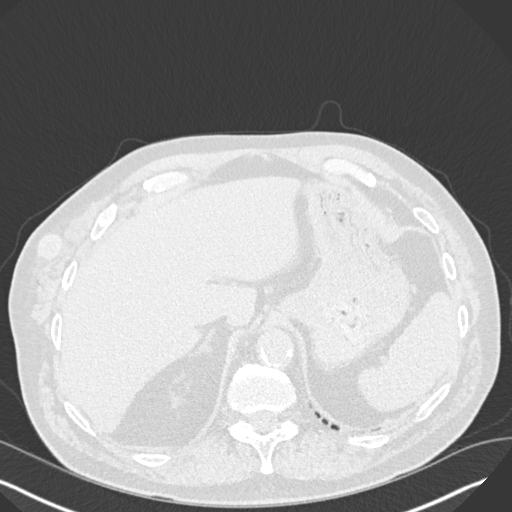
[im 31/140  lung]
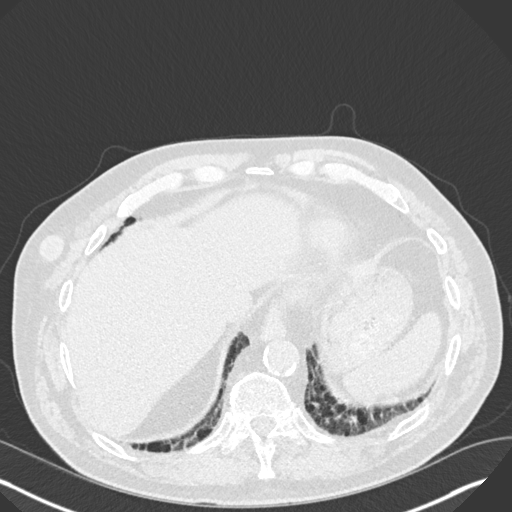
[im 42/140  lung]
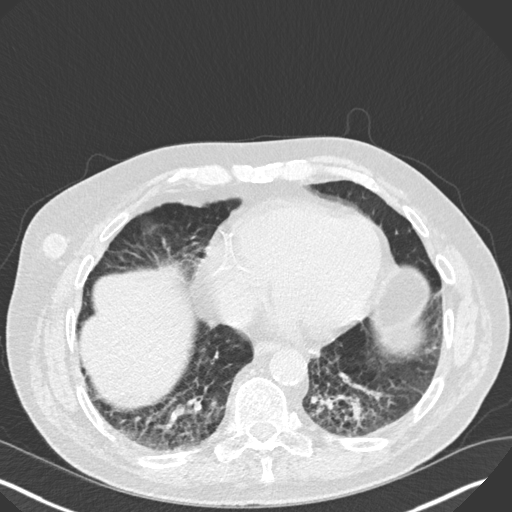
[im 52/140  mediastinal]
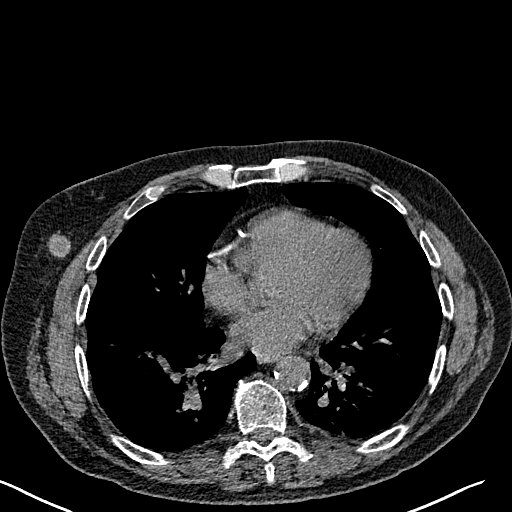
[im 52/140  lung]
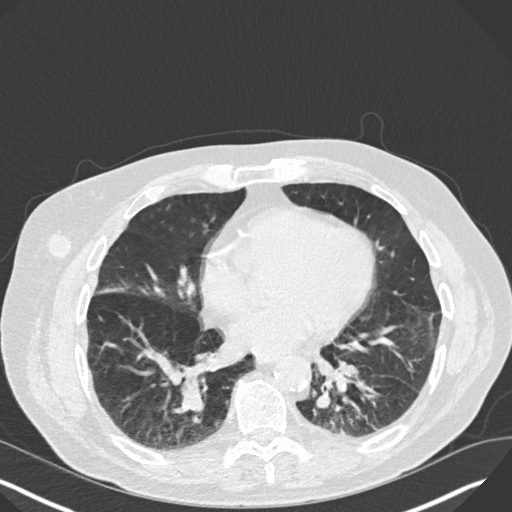
[im 62/140  lung]
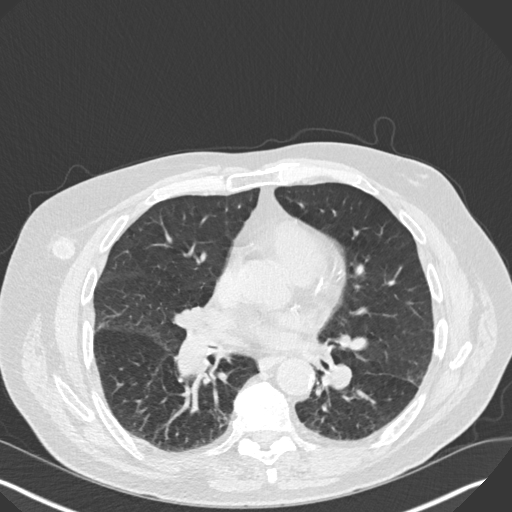
[im 78/140  lung]
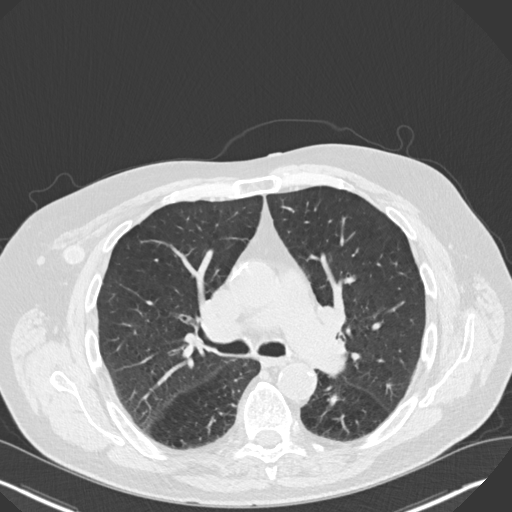
[im 88/140  lung]
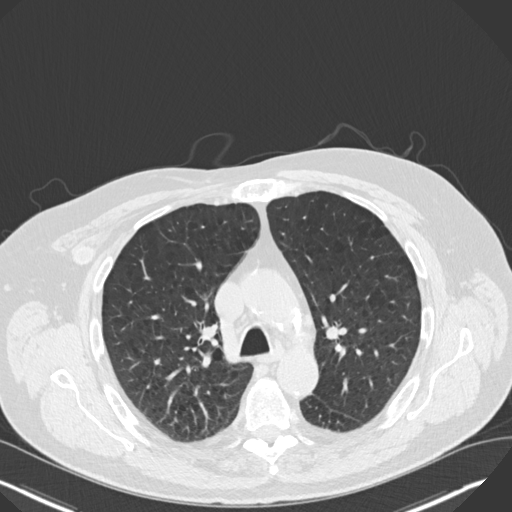
[im 98/140  mediastinal]
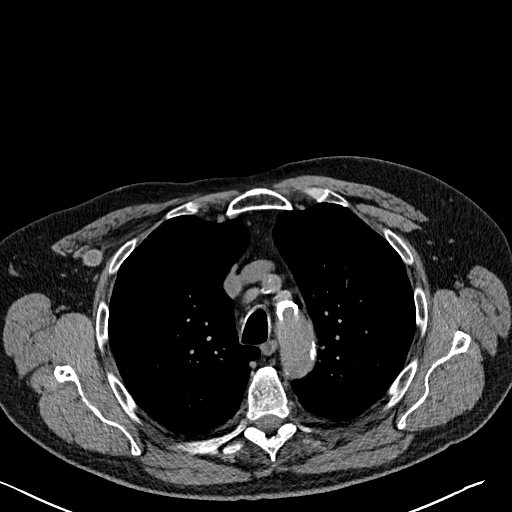
[im 98/140  lung]
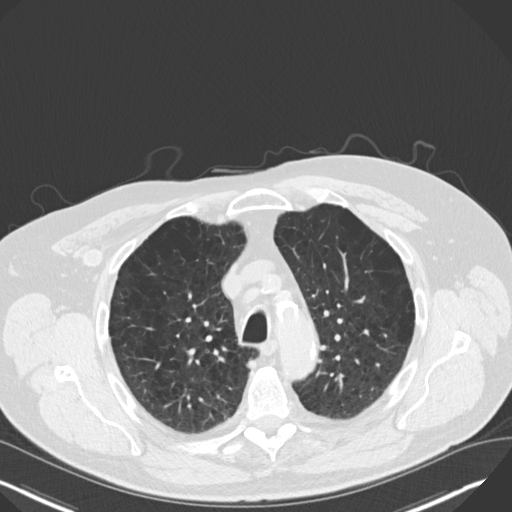
[im 109/140  lung]
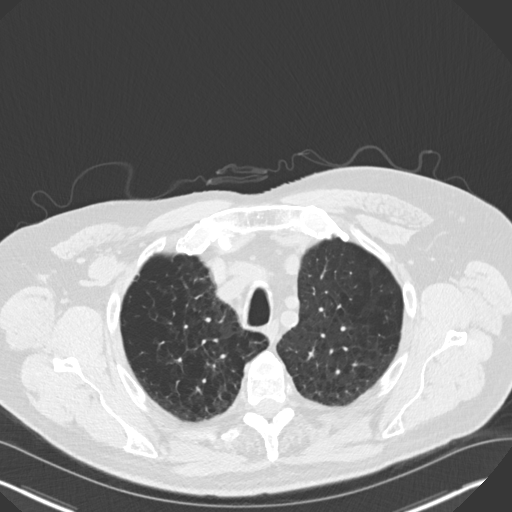
[im 119/140  lung]
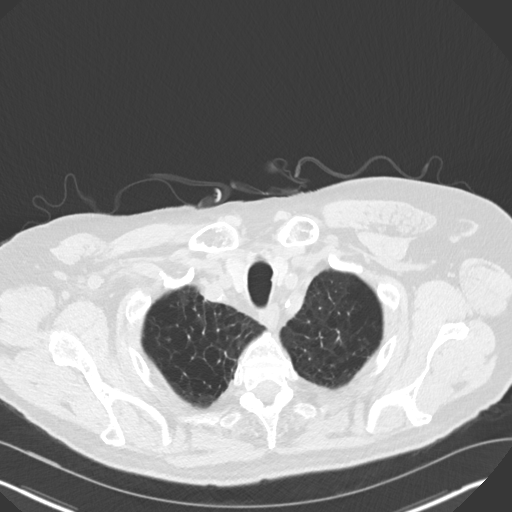
[im 129/140  lung]
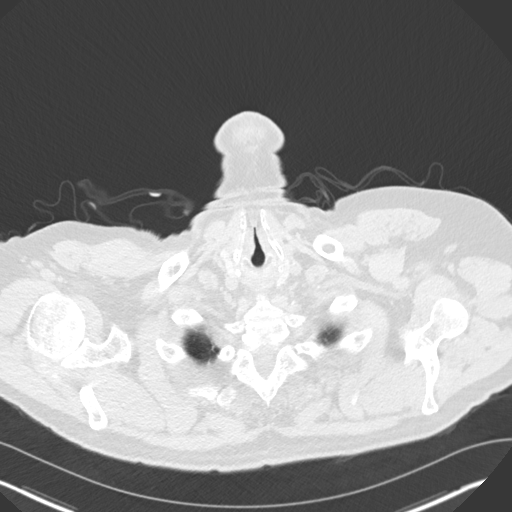

[Series 5: coronal · coronal · 0.59mm/px · 3 of 151 slices shown]
[im 31/151  lung]
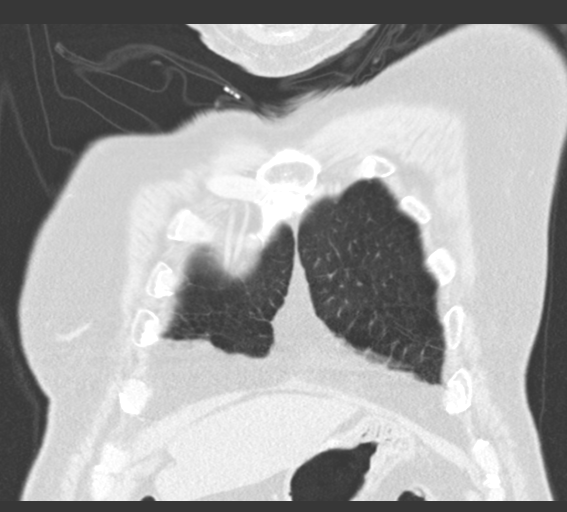
[im 61/151  lung]
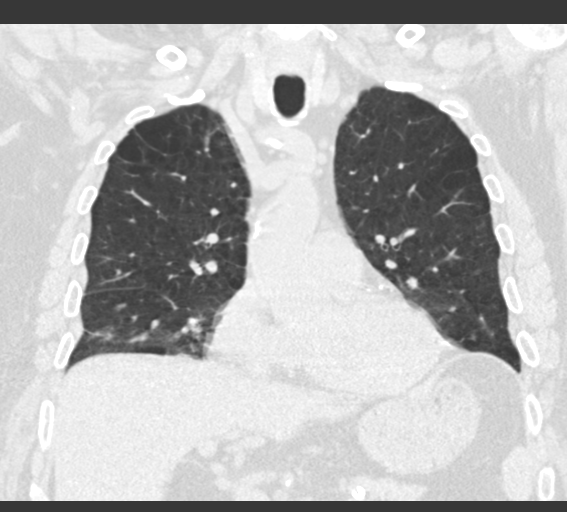
[im 91/151  lung]
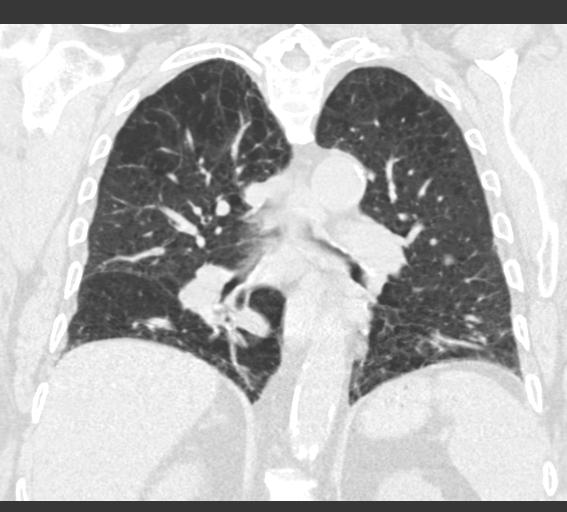

[15 of 36 positions shown; findings below may reference images not displayed]

FINDINGS: Cardiovascular: Heart top-normal size. Three vessel coronary artery
calcification.

Atherosclerotic changes thoracic aorta. Ascending thoracic aorta
measures up to 3.5 cm. Atherosclerotic changes great vessels.

Mediastinum/Nodes: Increase in size and number of normal to slightly
prominent size mediastinal lymph nodes. For instance, subcarinal
lymph nodes now with short axis dimension of 10.5 mm versus prior 9
mm. Slightly rounded configuration of pre aortic lymph node which
short axis dimension of 9 mm. Pretracheal lymph nodes which short
axis dimension of 8.1 mm.

No thyroid or obvious esophageal abnormality.

Lungs/Pleura: Chronic obstructive pulmonary type changes with
centrilobular emphysema most notable upper lung zones. Bilateral
lower lobe peribronchial thickening. Bilateral lower lobe and right
middle lobe scarring/atelectasis similar to prior exam. No new
worrisome lung mass identified.

Upper Abdomen: Several nonobstructing right renal calculi. Calcified
abdominal aorta, celiac artery and tortuous calcified splenic
artery.

Right subclavian jump graft seen within subcutaneous soft tissue
right lateral chest wall.

Musculoskeletal: Remote mid to upper thoracic compression
deformities/Schmorl's node deformities. No new compression fracture
or destructive lesion.
IMPRESSION: Chronic obstructive pulmonary type changes with centrilobular
emphysema most notable upper lung zones. Bilateral lower lobe
peribronchial thickening.

Bilateral lower lobe and right middle lobe scarring/atelectasis
similar to prior exam. No new worrisome lung mass identified.

Increase in size and number of normal to slightly prominent size
mediastinal lymph nodes as detailed above. Given this slight change
in addition to patient's long history of smoking, follow-up chest CT
in 6-12 months may be considered.

Coronary artery calcifications

Aortic Atherosclerosis ([id number]).

## 2018-12-18 ENCOUNTER — Other Ambulatory Visit: Payer: Self-pay | Admitting: Cardiovascular Disease

## 2019-03-19 ENCOUNTER — Other Ambulatory Visit: Payer: Self-pay | Admitting: Cardiovascular Disease

## 2019-04-02 ENCOUNTER — Encounter: Payer: Self-pay | Admitting: Orthopaedic Surgery

## 2019-04-02 ENCOUNTER — Other Ambulatory Visit: Payer: Self-pay

## 2019-04-02 ENCOUNTER — Ambulatory Visit (INDEPENDENT_AMBULATORY_CARE_PROVIDER_SITE_OTHER): Payer: Medicare Other | Admitting: Orthopaedic Surgery

## 2019-04-02 VITALS — BP 118/70 | HR 56 | Ht 65.0 in | Wt 200.0 lb

## 2019-04-02 DIAGNOSIS — Z96641 Presence of right artificial hip joint: Secondary | ICD-10-CM | POA: Diagnosis not present

## 2019-04-02 DIAGNOSIS — M1612 Unilateral primary osteoarthritis, left hip: Secondary | ICD-10-CM | POA: Diagnosis not present

## 2019-04-02 NOTE — Progress Notes (Signed)
Office Visit Note   Patient: [name]           Date of Birth: [birth year]           MRN: [id number] Visit Date: 04/02/2019              Requested by: Monico Blitz, MD [address] [place],  [place] [address] PCP: Monico Blitz, MD   Assessment & Plan: Visit Diagnoses:  1. Primary osteoarthritis of left hip   2. Status post right hip replacement     Plan: Status post bilateral total hip replacements and doing very well.  Accompanied by family member who relates that he is not using any ambulatory aid or taking any specific medicines for his hip.  [name] is very happy.  Left hip replacement in August 2019 and right hip in December 2019.  Encouraged him to remain active and be sure to take antibiotics with any invasive procedures  Follow-Up Instructions: Return if symptoms worsen or fail to improve.   Orders:  No orders of the defined types were placed in this encounter.  No orders of the defined types were placed in this encounter.     Procedures: No procedures performed   Clinical Data: No additional findings.   Subjective: Chief Complaint  Patient presents with  . Right Hip - Follow-up    Right THA DOS 06/25/2018  . Left Hip - Follow-up    Left THA  DOS 02/19/2018  Patient presents today for a follow up on both his hips. He had his right hip replaced on 06/25/18, and his left hip replaced on 02/19/18. He is doing well with no complaints.  HPI  Review of Systems   Objective: Vital Signs: BP 118/70   Pulse (!) 56   Ht 5\' 5"  (1.651 m)   Wt 200 lb (90.7 kg)   BMI 33.28 kg/m   Physical Exam Constitutional:      Appearance: He is well-developed.  Eyes:     Pupils: Pupils are equal, round, and reactive to light.  Pulmonary:     Effort: Pulmonary effort is normal.  Skin:    General: Skin is warm and dry.  Neurological:     Mental Status: He is alert and oriented to person, place, and time.  Psychiatric:        Behavior: Behavior normal.     Ortho  Exam ambulates without an obvious limp.  Has gained some weight.  No shortness of breath or chest pain.  Motor exam intact.  Painless range of motion both hips. Specialty Comments:  No specialty comments available.  Imaging: No results found.   PMFS History: Patient Active Problem List   Diagnosis Date Noted  . Status post total replacement of right hip 06/25/2018  . Pain in right hip 06/19/2018  . Pre-op evaluation 06/07/2018  . Primary osteoarthritis of left hip 02/19/2018  . Avascular necrosis of bone of right hip ([place]) 02/19/2018  . Status post right hip replacement 02/19/2018  . Unilateral primary osteoarthritis, left hip 01/09/2018  . Atherosclerosis of native arteries of the extremities with intermittent claudication 03/30/2014  . Aftercare following surgery of the circulatory system, [place] 03/30/2014  . Muscle cramps- Right  [place] / [place] 03/30/2014  . Paresthesia of right foot 03/30/2014  . Chest pain 12/30/2013  . Unstable angina ([place]) 12/29/2013  . Chest pain at rest 12/29/2013  . Chronic respiratory failure ([place]) 12/29/2013  . COPD (chronic obstructive pulmonary disease) ([place])   . HTN (  hypertension), malignant 10/07/2013  . Dyspnea 03/06/2013  . Tobacco abuse   . HLD (hyperlipidemia)   . Peripheral vascular disease, unspecified (HCC) 03/26/2012  . Coronary atherosclerosis of native coronary artery 07/28/2011  . Mixed hyperlipidemia 07/28/2011  . Essential hypertension, benign 07/28/2011  . Peripheral arterial disease (HCC) 07/28/2011  . Carotid artery disease (HCC) 07/28/2011   Past Medical History:  Diagnosis Date  . Asthma   . Carotid artery disease (HCC)    50-69% RICA and less than 50% LICA, 07/2011  . COPD (chronic obstructive pulmonary disease) (HCC)   . Coronary atherosclerosis of native coronary artery    Based on abnormal Myoview - inferior scar, 05/2011; LVEF 45-50%, echo, 07/2011  . Essential hypertension, benign   . HLD (hyperlipidemia)   . NSTEMI  (non-ST elevated myocardial infarction) (HCC)    12/12, minor  . On home oxygen therapy    4Lnc with exertion  . Peripheral arterial disease (HCC)   . Tobacco abuse     Family History  Problem Relation Age of Onset  . Heart attack Father   . Heart attack Mother     Past Surgical History:  Procedure Laterality Date  . ABDOMINAL SURGERY     2008  . COLONOSCOPY    . Right axillary to femoral bypass  3/12   Dr. Hart Rochester  . RIGHT HYDROCELE REPAIR    . Right to left femoral to femoral bypass  3/12   Dr. Hart Rochester  . SIGMOID RESECTION / RECTOPEXY  2005  . skin abscess drainage    . TOTAL HIP ARTHROPLASTY Left 02/19/2018   Procedure: LEFT TOTAL HIP ARTHROPLASTY;  Surgeon: Valeria Batman, MD;  Location: [facility] OR;  Service: Orthopedics;  Laterality: Left;  . TOTAL HIP ARTHROPLASTY Right 06/25/2018   Procedure: RIGHT TOTAL HIP ARTHROPLASTY;  Surgeon: Valeria Batman, MD;  Location: [facility] OR;  Service: Orthopedics;  Laterality: Right;   Social History   Occupational History  . Not on file  Tobacco Use  . Smoking status: Former Smoker    Packs/day: 2.00    Years: 55.00    Pack years: 110.00    Types: Cigarettes    Start date: 12/03/1959    Quit date: 12/09/2010    Years since quitting: 8.3  . Smokeless tobacco: Never Used  . Tobacco comment: Patient has not somked since midnight on wednesday.   Substance and Sexual Activity  . Alcohol use: No    Alcohol/week: 0.0 standard drinks  . Drug use: No  . Sexual activity: Not on file

## 2019-04-04 ENCOUNTER — Other Ambulatory Visit: Payer: Self-pay | Admitting: Cardiovascular Disease

## 2019-05-12 ENCOUNTER — Other Ambulatory Visit: Payer: Self-pay | Admitting: Cardiovascular Disease

## 2019-07-05 ENCOUNTER — Encounter (HOSPITAL_COMMUNITY): Payer: Self-pay

## 2019-07-05 ENCOUNTER — Emergency Department (HOSPITAL_COMMUNITY): Payer: Medicare Other

## 2019-07-05 ENCOUNTER — Inpatient Hospital Stay (HOSPITAL_COMMUNITY)
Admission: EM | Admit: 2019-07-05 | Discharge: 2019-07-11 | DRG: 177 | Disposition: E | Payer: Medicare Other | Attending: Internal Medicine | Admitting: Internal Medicine

## 2019-07-05 DIAGNOSIS — R042 Hemoptysis: Secondary | ICD-10-CM | POA: Diagnosis not present

## 2019-07-05 DIAGNOSIS — K921 Melena: Secondary | ICD-10-CM | POA: Diagnosis not present

## 2019-07-05 DIAGNOSIS — E870 Hyperosmolality and hypernatremia: Secondary | ICD-10-CM | POA: Diagnosis present

## 2019-07-05 DIAGNOSIS — J129 Viral pneumonia, unspecified: Secondary | ICD-10-CM | POA: Diagnosis present

## 2019-07-05 DIAGNOSIS — E782 Mixed hyperlipidemia: Secondary | ICD-10-CM | POA: Diagnosis present

## 2019-07-05 DIAGNOSIS — R0600 Dyspnea, unspecified: Secondary | ICD-10-CM

## 2019-07-05 DIAGNOSIS — I4510 Unspecified right bundle-branch block: Secondary | ICD-10-CM | POA: Diagnosis present

## 2019-07-05 DIAGNOSIS — E872 Acidosis: Secondary | ICD-10-CM | POA: Diagnosis not present

## 2019-07-05 DIAGNOSIS — R339 Retention of urine, unspecified: Secondary | ICD-10-CM | POA: Diagnosis present

## 2019-07-05 DIAGNOSIS — Z96643 Presence of artificial hip joint, bilateral: Secondary | ICD-10-CM | POA: Diagnosis present

## 2019-07-05 DIAGNOSIS — N1831 Chronic kidney disease, stage 3a: Secondary | ICD-10-CM | POA: Diagnosis not present

## 2019-07-05 DIAGNOSIS — R071 Chest pain on breathing: Secondary | ICD-10-CM | POA: Diagnosis not present

## 2019-07-05 DIAGNOSIS — U071 COVID-19: Secondary | ICD-10-CM | POA: Diagnosis present

## 2019-07-05 DIAGNOSIS — Z22322 Carrier or suspected carrier of Methicillin resistant Staphylococcus aureus: Secondary | ICD-10-CM

## 2019-07-05 DIAGNOSIS — I129 Hypertensive chronic kidney disease with stage 1 through stage 4 chronic kidney disease, or unspecified chronic kidney disease: Secondary | ICD-10-CM | POA: Diagnosis present

## 2019-07-05 DIAGNOSIS — Z66 Do not resuscitate: Secondary | ICD-10-CM | POA: Diagnosis present

## 2019-07-05 DIAGNOSIS — J440 Chronic obstructive pulmonary disease with acute lower respiratory infection: Secondary | ICD-10-CM | POA: Diagnosis present

## 2019-07-05 DIAGNOSIS — I959 Hypotension, unspecified: Secondary | ICD-10-CM

## 2019-07-05 DIAGNOSIS — I252 Old myocardial infarction: Secondary | ICD-10-CM

## 2019-07-05 DIAGNOSIS — F0390 Unspecified dementia without behavioral disturbance: Secondary | ICD-10-CM | POA: Diagnosis present

## 2019-07-05 DIAGNOSIS — R710 Precipitous drop in hematocrit: Secondary | ICD-10-CM | POA: Diagnosis present

## 2019-07-05 DIAGNOSIS — I739 Peripheral vascular disease, unspecified: Secondary | ICD-10-CM | POA: Diagnosis present

## 2019-07-05 DIAGNOSIS — R338 Other retention of urine: Secondary | ICD-10-CM

## 2019-07-05 DIAGNOSIS — R578 Other shock: Secondary | ICD-10-CM | POA: Diagnosis not present

## 2019-07-05 DIAGNOSIS — J9610 Chronic respiratory failure, unspecified whether with hypoxia or hypercapnia: Secondary | ICD-10-CM | POA: Diagnosis present

## 2019-07-05 DIAGNOSIS — J9611 Chronic respiratory failure with hypoxia: Secondary | ICD-10-CM | POA: Diagnosis not present

## 2019-07-05 DIAGNOSIS — I10 Essential (primary) hypertension: Secondary | ICD-10-CM | POA: Diagnosis not present

## 2019-07-05 DIAGNOSIS — Z9981 Dependence on supplemental oxygen: Secondary | ICD-10-CM | POA: Diagnosis not present

## 2019-07-05 DIAGNOSIS — I2510 Atherosclerotic heart disease of native coronary artery without angina pectoris: Secondary | ICD-10-CM | POA: Diagnosis present

## 2019-07-05 DIAGNOSIS — E860 Dehydration: Secondary | ICD-10-CM | POA: Diagnosis present

## 2019-07-05 DIAGNOSIS — N179 Acute kidney failure, unspecified: Secondary | ICD-10-CM | POA: Diagnosis present

## 2019-07-05 DIAGNOSIS — F319 Bipolar disorder, unspecified: Secondary | ICD-10-CM | POA: Diagnosis present

## 2019-07-05 DIAGNOSIS — Z8249 Family history of ischemic heart disease and other diseases of the circulatory system: Secondary | ICD-10-CM

## 2019-07-05 DIAGNOSIS — J9621 Acute and chronic respiratory failure with hypoxia: Secondary | ICD-10-CM | POA: Diagnosis present

## 2019-07-05 DIAGNOSIS — J418 Mixed simple and mucopurulent chronic bronchitis: Secondary | ICD-10-CM | POA: Diagnosis not present

## 2019-07-05 DIAGNOSIS — D62 Acute posthemorrhagic anemia: Secondary | ICD-10-CM

## 2019-07-05 DIAGNOSIS — Z79899 Other long term (current) drug therapy: Secondary | ICD-10-CM

## 2019-07-05 DIAGNOSIS — Z7982 Long term (current) use of aspirin: Secondary | ICD-10-CM

## 2019-07-05 DIAGNOSIS — Z7952 Long term (current) use of systemic steroids: Secondary | ICD-10-CM

## 2019-07-05 DIAGNOSIS — R079 Chest pain, unspecified: Secondary | ICD-10-CM

## 2019-07-05 DIAGNOSIS — N1832 Chronic kidney disease, stage 3b: Secondary | ICD-10-CM | POA: Diagnosis present

## 2019-07-05 DIAGNOSIS — Z87891 Personal history of nicotine dependence: Secondary | ICD-10-CM

## 2019-07-05 DIAGNOSIS — J449 Chronic obstructive pulmonary disease, unspecified: Secondary | ICD-10-CM | POA: Diagnosis present

## 2019-07-05 LAB — URINALYSIS, ROUTINE W REFLEX MICROSCOPIC
Bilirubin Urine: NEGATIVE
Glucose, UA: NEGATIVE mg/dL
Ketones, ur: NEGATIVE mg/dL
Leukocytes,Ua: NEGATIVE
Nitrite: NEGATIVE
Protein, ur: NEGATIVE mg/dL
Specific Gravity, Urine: 1.015 (ref 1.005–1.030)
pH: 5 (ref 5.0–8.0)

## 2019-07-05 LAB — CBC WITH DIFFERENTIAL/PLATELET
Abs Immature Granulocytes: 0.09 10*3/uL — ABNORMAL HIGH (ref 0.00–0.07)
Basophils Absolute: 0 10*3/uL (ref 0.0–0.1)
Basophils Relative: 0 %
Eosinophils Absolute: 0 10*3/uL (ref 0.0–0.5)
Eosinophils Relative: 0 %
HCT: 37.2 % — ABNORMAL LOW (ref 39.0–52.0)
Hemoglobin: 11.5 g/dL — ABNORMAL LOW (ref 13.0–17.0)
Immature Granulocytes: 1 %
Lymphocytes Relative: 9 %
Lymphs Abs: 1.3 10*3/uL (ref 0.7–4.0)
MCH: 29.4 pg (ref 26.0–34.0)
MCHC: 30.9 g/dL (ref 30.0–36.0)
MCV: 95.1 fL (ref 80.0–100.0)
Monocytes Absolute: 0.6 10*3/uL (ref 0.1–1.0)
Monocytes Relative: 4 %
Neutro Abs: 11.7 10*3/uL — ABNORMAL HIGH (ref 1.7–7.7)
Neutrophils Relative %: 86 %
Platelets: 144 10*3/uL — ABNORMAL LOW (ref 150–400)
RBC: 3.91 MIL/uL — ABNORMAL LOW (ref 4.22–5.81)
RDW: 17.5 % — ABNORMAL HIGH (ref 11.5–15.5)
WBC: 13.6 10*3/uL — ABNORMAL HIGH (ref 4.0–10.5)
nRBC: 0 % (ref 0.0–0.2)

## 2019-07-05 LAB — D-DIMER, QUANTITATIVE: D-Dimer, Quant: 7.26 ug{FEU}/mL — ABNORMAL HIGH (ref 0.00–0.50)

## 2019-07-05 LAB — BASIC METABOLIC PANEL
Anion gap: 12 (ref 5–15)
BUN: 69 mg/dL — ABNORMAL HIGH (ref 8–23)
CO2: 22 mmol/L (ref 22–32)
Calcium: 8.1 mg/dL — ABNORMAL LOW (ref 8.9–10.3)
Chloride: 109 mmol/L (ref 98–111)
Creatinine, Ser: 4.12 mg/dL — ABNORMAL HIGH (ref 0.61–1.24)
GFR calc Af Amer: 15 mL/min — ABNORMAL LOW (ref >60)
GFR calc non Af Amer: 13 mL/min — ABNORMAL LOW (ref >60)
Glucose, Bld: 142 mg/dL — ABNORMAL HIGH (ref 70–99)
Potassium: 4.6 mmol/L (ref 3.5–5.1)
Sodium: 143 mmol/L (ref 135–145)

## 2019-07-05 LAB — LACTATE DEHYDROGENASE: LDH: 309 U/L — ABNORMAL HIGH (ref 98–192)

## 2019-07-05 LAB — FIBRINOGEN: Fibrinogen: 774 mg/dL — ABNORMAL HIGH (ref 210–475)

## 2019-07-05 LAB — C-REACTIVE PROTEIN: CRP: 16.3 mg/dL — ABNORMAL HIGH (ref <1.0)

## 2019-07-05 LAB — ABO/RH: ABO/RH(D): A POS

## 2019-07-05 LAB — LACTIC ACID, PLASMA
Lactic Acid, Venous: 2.4 mmol/L (ref 0.5–1.9)
Lactic Acid, Venous: 1.5 mmol/L (ref 0.5–1.9)

## 2019-07-05 LAB — PROCALCITONIN: Procalcitonin: 0.73 ng/mL

## 2019-07-05 LAB — FERRITIN: Ferritin: 136 ng/mL (ref 24–336)

## 2019-07-05 LAB — BRAIN NATRIURETIC PEPTIDE: B Natriuretic Peptide: 66 pg/mL (ref 0.0–100.0)

## 2019-07-05 LAB — TROPONIN I (HIGH SENSITIVITY): Troponin I (High Sensitivity): 89 ng/L — ABNORMAL HIGH (ref <18)

## 2019-07-05 LAB — POC SARS CORONAVIRUS 2 AG -  ED: SARS Coronavirus 2 Ag: POSITIVE — AB

## 2019-07-05 LAB — TRIGLYCERIDES: Triglycerides: 144 mg/dL (ref <150)

## 2019-07-05 MED ORDER — ALBUTEROL SULFATE HFA 108 (90 BASE) MCG/ACT IN AERS
2.0000 | INHALATION_SPRAY | Freq: Four times a day (QID) | RESPIRATORY_TRACT | Status: DC
  Administered 2019-07-06 (×4): 2 via RESPIRATORY_TRACT
  Filled 2019-07-05: qty 6.7

## 2019-07-05 MED ORDER — ALBUTEROL SULFATE HFA 108 (90 BASE) MCG/ACT IN AERS
2.0000 | INHALATION_SPRAY | Freq: Once | RESPIRATORY_TRACT | Status: AC
  Administered 2019-07-05: 19:00:00 2 via RESPIRATORY_TRACT
  Filled 2019-07-05: qty 6.7

## 2019-07-05 MED ORDER — SODIUM CHLORIDE 0.9 % IV SOLN
200.0000 mg | Freq: Once | INTRAVENOUS | Status: AC
  Administered 2019-07-06: 200 mg via INTRAVENOUS
  Filled 2019-07-05: qty 40

## 2019-07-05 MED ORDER — SODIUM CHLORIDE 0.9 % IV BOLUS
1000.0000 mL | Freq: Once | INTRAVENOUS | Status: AC
  Administered 2019-07-05: 1000 mL via INTRAVENOUS

## 2019-07-05 MED ORDER — ASCORBIC ACID 500 MG PO TABS
500.0000 mg | ORAL_TABLET | Freq: Every day | ORAL | Status: DC
  Administered 2019-07-06 – 2019-07-09 (×4): 500 mg via ORAL
  Filled 2019-07-05 (×5): qty 1

## 2019-07-05 MED ORDER — SODIUM CHLORIDE 0.9 % IV BOLUS: 500.0000 mL | Freq: Once | INTRAVENOUS | Status: DC

## 2019-07-05 MED ORDER — SODIUM CHLORIDE 0.9 % IV SOLN
100.0000 mg | Freq: Every day | INTRAVENOUS | Status: AC
  Administered 2019-07-06 – 2019-07-09 (×4): 100 mg via INTRAVENOUS
  Filled 2019-07-05: qty 100
  Filled 2019-07-05: qty 20
  Filled 2019-07-05: qty 100
  Filled 2019-07-05: qty 20
  Filled 2019-07-05: qty 100

## 2019-07-05 MED ORDER — METHYLPREDNISOLONE SODIUM SUCC 40 MG IJ SOLR
40.0000 mg | Freq: Two times a day (BID) | INTRAMUSCULAR | Status: DC
  Administered 2019-07-06 – 2019-07-09 (×8): 40 mg via INTRAVENOUS
  Filled 2019-07-05 (×9): qty 1

## 2019-07-05 MED ORDER — GUAIFENESIN-DM 100-10 MG/5ML PO SYRP
10.0000 mL | ORAL_SOLUTION | ORAL | Status: DC | PRN
  Administered 2019-07-07 – 2019-07-08 (×2): 10 mL via ORAL
  Filled 2019-07-05 (×2): qty 10

## 2019-07-05 MED ORDER — METHYLPREDNISOLONE SODIUM SUCC 125 MG IJ SOLR
125.0000 mg | Freq: Once | INTRAMUSCULAR | Status: AC
  Administered 2019-07-05: 19:00:00 125 mg via INTRAVENOUS
  Filled 2019-07-05: qty 2

## 2019-07-05 MED ORDER — HYDROCOD POLST-CPM POLST ER 10-8 MG/5ML PO SUER
5.0000 mL | Freq: Two times a day (BID) | ORAL | Status: DC | PRN
  Administered 2019-07-09: 5 mL via ORAL
  Filled 2019-07-05: qty 5

## 2019-07-05 MED ORDER — ZINC SULFATE 220 (50 ZN) MG PO CAPS
220.0000 mg | ORAL_CAPSULE | Freq: Every day | ORAL | Status: DC
  Administered 2019-07-06 – 2019-07-09 (×4): 220 mg via ORAL
  Filled 2019-07-05 (×5): qty 1

## 2019-07-05 NOTE — ED Notes (Signed)
PA at bedside.

## 2019-07-05 NOTE — ED Notes (Signed)
Date and time results received: 2019/07/29 7:22 PM  (use smartphrase ".now" to insert current time)  Test: poc covid Critical Value: covid positive  Name of Provider Notified: long,[email or web address]  Orders Received? Or Actions Taken?: acknowledged

## 2019-07-05 NOTE — H&P (Signed)
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  I have personally briefly reviewed patient's old medical records in [facility]  Chief Complaint: Hypoxia and AMS.  HPI: LADARRELL CORNWALL is a 73 y.o. male with medical history significant of asthma, carotid artery disease, COPD on home oxygen typically 4 LPM, CAD, essential hypertension, hyperlipidemia, CAD, history of NSTEMI, peripheral arterial disease, tobacco use who was brought from [facility] due to hypoxia of 71% and hypotension with BP of 71/54 mmHg.  Per EMS, he was 100% on his usual 4 LPM.  His systolic blood pressures 93 mmHg.  He has history of exposure to Covid.  He is unable to provide further history at this time.  ED Course: Initial vital signs were temperature 97.6 F, pulse 99, respiration 21, blood pressure 124/62 mmHg and O2 sat 100% on 4 LPM via nasal cannula.  The patient received Solu-Medrol and 1000 mL of NS bolus.  Urinalysis shows small hemoglobinuria and rare bacteria on microscopic examination.  His white count is 13.6 g/dL, hemoglobin 96.2 g% and platelets 144. Fibrinogen was 1774 and D-dimer 7.26. BNP was 66 pg/mL.  Troponin was 89 and then 61 ng/L.  Lactic acid was 2.4 and 1.5 mmol/L. LDH 309, triglycerides 144, procalcitonin 0.73, ferritin 136 and CRP 16.3.Chest radiograph shows bibasilar atelectasis with no acute cardiopulmonary disease.  There is stable mild cardiomegaly without pulmonary edema.  Review of Systems: Unable to obtain.  Past Medical History:  Diagnosis Date  . Asthma   . Carotid artery disease (HCC)    50-69% RICA and less than 50% LICA, 07/2011  . COPD (chronic obstructive pulmonary disease) (HCC)   . Coronary atherosclerosis of native coronary artery    Based on abnormal Myoview - inferior scar, 05/2011; LVEF 45-50%, echo, 07/2011  . Essential hypertension, benign   . HLD (hyperlipidemia)   .  NSTEMI (non-ST elevated myocardial infarction) (HCC)    12/12, minor  . On home oxygen therapy    4Lnc with exertion  . Peripheral arterial disease (HCC)   . Tobacco abuse     Past Surgical History:  Procedure Laterality Date  . ABDOMINAL SURGERY     2008  . COLONOSCOPY    . Right axillary to femoral bypass  3/12   Dr. Hart Rochester  . RIGHT HYDROCELE REPAIR    . Right to left femoral to femoral bypass  3/12   Dr. Hart Rochester  . SIGMOID RESECTION / RECTOPEXY  2005  . skin abscess drainage    . TOTAL HIP ARTHROPLASTY Left 02/19/2018   Procedure: LEFT TOTAL HIP ARTHROPLASTY;  Surgeon: Valeria Batman, MD;  Location: [facility] OR;  Service: Orthopedics;  Laterality: Left;  . TOTAL HIP ARTHROPLASTY Right 06/25/2018   Procedure: RIGHT TOTAL HIP ARTHROPLASTY;  Surgeon: Valeria Batman, MD;  Location: [facility] OR;  Service: Orthopedics;  Laterality: Right;     reports that he quit smoking about 8 years ago. His smoking use included cigarettes. He started smoking about 59 years ago. He has a 110.00 pack-year smoking history. He has never used smokeless tobacco. He reports that he does not drink alcohol or use drugs.  No Known Allergies  Family History  Problem Relation Age of Onset  . Heart attack Father   . Heart attack Mother    Prior to Admission medications   Medication Sig Start Date End Date Taking? Authorizing Provider  albuterol (  PROAIR HFA) 108 (90 BASE) MCG/ACT inhaler Inhale 2 puffs into the lungs every 6 (six) hours as needed for shortness of breath.     [provider]  albuterol (PROVENTIL) (2.5 MG/3ML) 0.083% nebulizer solution Take 2.5 mg by nebulization every 6 (six) hours as needed for shortness of breath.     [provider]  aspirin EC 325 MG EC tablet Take 1 tablet (325 mg total) by mouth 2 (two) times daily at 8 am and 10 pm. 06/27/18   Petrarca, Oris DroneBrian D, PA-C  HYDROcodone-acetaminophen (NORCO/VICODIN) 5-325 MG tablet Take 1-2 tablets by mouth every 4 (four) hours  as needed for moderate pain (pain score 4-6). 06/27/18   Jetty PeeksPetrarca, [name] D, PA-C  isosorbide mononitrate (IMDUR) 60 MG 24 hr tablet TAKE 1 TABLET BY MOUTH DAILY (NEEDS OFFICE VISIT) 05/12/19   Laqueta LindenKoneswaran, Suresh A, MD  lisinopril-hydrochlorothiazide (PRINZIDE,ZESTORETIC) 20-12.5 MG tablet Take 1 tablet by mouth daily. 12/05/17   [provider]  nitroGLYCERIN (NITROSTAT) 0.4 MG SL tablet Place 1 tablet (0.4 mg total) under the tongue every 5 (five) minutes x 3 doses as needed for chest pain. 11/24/14   Laqueta LindenKoneswaran, Suresh A, MD  oxymetazoline (AFRIN) 0.05 % nasal spray Place 1 spray into both nostrils as needed for congestion.    [provider]  simvastatin (ZOCOR) 20 MG tablet TAKE 1 TABLET BY MOUTH EVERY DAY Patient taking differently: Take 20 mg by mouth at bedtime.  01/21/16   Laqueta LindenKoneswaran, Suresh A, MD  tetrahydrozoline (VISINE) 0.05 % ophthalmic solution Place 1 drop into both eyes daily as needed (for dry eyes).     [provider]  umeclidinium-vilanterol (ANORO ELLIPTA) 62.5-25 MCG/INH AEPB Inhale 1 puff daily into the lungs.    [provider]    Physical Exam: Vitals:   02-Jun-2019 1930 02-Jun-2019 2100 02-Jun-2019 2136 02-Jun-2019 2300  BP: (!) 100/46 (!) 89/53 (!) 88/60 (!) 97/50  Pulse:  92  94  Resp: (!) 22   (!) 22  Temp:      TempSrc:      SpO2:  99% 93% 100%  Weight:      Height:        Constitutional: Febrile, looks acutely ill. Eyes: PERRL, lids and conjunctivae mildly injected. ENMT: Mucous membranes are dry.  Posterior pharynx clear of any exudate or lesions Neck: normal, supple, no masses, no thyromegaly Respiratory: Decreased breath sounds in bases, otherwise clear to auscultation bilaterally, no wheezing, no crackles. Normal respiratory effort. No accessory muscle use.  Cardiovascular: Regular rate and rhythm, no murmurs / rubs / gallops. No extremity edema. 2+ pedal pulses. No carotid bruits.  Abdomen: Bowel sounds positive. Nondistended,  obese, soft, no tenderness, no masses palpated. No hepatosplenomegaly.  Musculoskeletal: no clubbing / cyanosis. Good ROM, no contractures. Normal muscle tone.  Skin: no rashes, lesions, ulcers on very limited dermatological examination. Unable to examine back and sacral area. Neurologic: CN 2-12 grossly intact. Sensation intact, DTR normal. Psychiatric: Somnolent, but wakes up briefly and answer simple questions.  Oriented to name only.  Labs on Admission: I have personally reviewed following labs and imaging studies  CBC: Recent Labs  Lab 02-Jun-2019 1926  WBC 13.6*  NEUTROABS 11.7*  HGB 11.5*  HCT 37.2*  MCV 95.1  PLT 144*   Basic Metabolic Panel: Recent Labs  Lab 02-Jun-2019 1926  NA 143  K 4.6  CL 109  CO2 22  GLUCOSE 142*  BUN 69*  CREATININE 4.12*  CALCIUM 8.1*   GFR: Estimated Creatinine  Clearance: 15.9 mL/min (A) (by C-G formula based on SCr of 4.12 mg/dL (H)). Liver Function Tests: No results for input(s): AST, ALT, ALKPHOS, BILITOT, PROT, ALBUMIN in the last 168 hours. No results for input(s): LIPASE, AMYLASE in the last 168 hours. No results for input(s): AMMONIA in the last 168 hours. Coagulation Profile: No results for input(s): INR, PROTIME in the last 168 hours. Cardiac Enzymes: No results for input(s): CKTOTAL, CKMB, CKMBINDEX, TROPONINI in the last 168 hours. BNP (last 3 results) No results for input(s): PROBNP in the last 8760 hours. HbA1C: No results for input(s): HGBA1C in the last 72 hours. CBG: No results for input(s): GLUCAP in the last 168 hours. Lipid Profile: Recent Labs    23-Jul-2019 1926  TRIG 144   Thyroid Function Tests: No results for input(s): TSH, T4TOTAL, FREET4, T3FREE, THYROIDAB in the last 72 hours. Anemia Panel: No results for input(s): VITAMINB12, FOLATE, FERRITIN, TIBC, IRON, RETICCTPCT in the last 72 hours. Urine analysis:    Component Value Date/Time   COLORURINE YELLOW July 23, 2019 2204   APPEARANCEUR HAZY (A) 2019-07-23  2204   LABSPEC 1.015 2019/07/23 2204   PHURINE 5.0 23-Jul-2019 2204   GLUCOSEU NEGATIVE July 23, 2019 2204   HGBUR SMALL (A) 07-23-19 2204   BILIRUBINUR NEGATIVE 07-23-2019 2204   KETONESUR NEGATIVE 2019-07-23 2204   PROTEINUR NEGATIVE Jul 23, 2019 2204   UROBILINOGEN 0.2 09/14/2010 0706   NITRITE NEGATIVE 07-23-2019 2204   LEUKOCYTESUR NEGATIVE 07/23/2019 2204    Radiological Exams on Admission: DG Chest Portable 1 View  Result Date: July 23, 2019 CLINICAL DATA:  73 year old with acute onset of shortness of breath. EXAM: PORTABLE CHEST 1 VIEW COMPARISON:  06/14/2019 and earlier. FINDINGS: Suboptimal inspiration accounts for crowded bronchovascular markings diffusely and atelectasis in the bases, and accentuates the cardiac silhouette. Taking this into account, cardiac silhouette upper normal in size to mildly enlarged for AP portable technique, unchanged. Mild pulmonary venous hypertension without overt edema. Lungs otherwise clear. No visible pleural effusions. IMPRESSION: 1. Suboptimal inspiration accounts for bibasilar atelectasis. No acute cardiopulmonary disease otherwise. 2. Stable mild cardiomegaly without pulmonary edema. Electronically Signed   By: Hulan Saas M.D.   On: Jul 23, 2019 18:55    EKG: Independently reviewed.  Vent. rate 99 BPM PR interval * ms QRS duration 145 ms QT/QTc 377/484 ms P-R-T axes 76 61 47 Sinus rhythm RBBB  Assessment/Plan Principal Problem:   COVID-19 virus infection Admit to telemetry/inpatient. Continue supplemental oxygen. Continue bronchodilators. Continue glucocorticoid. Remdesivir per pharmacy. Antibacterial started leukocytosis/increased procalcitonin. Suspicion for bacterial infection is moderate. Recheck procalcitonin to see if decreased. Actemra if bacterial infection suspicion becomes low. Follow-up CBC, CMP and inflammatory markers.  Active Problems:   COPD (chronic obstructive pulmonary disease) (HCC)   Chronic respiratory  failure (HCC) Supplemental oxygen and bronchodilators. Continue COVID-19 treatment as above    AKI (acute kidney injury) (HCC) Continue IV hydration. Hold ACE inhibitor and diuretic. Monitor intake and output. Check total CK. Monitor renal function electrolytes. Consult nephrology if no improvement.    Coronary atherosclerosis of native coronary artery Continue aspirin and statin once total CK checked.    Mixed hyperlipidemia Check total CK in the presence of AKI. Resume simvastatin when appropriate.    Essential hypertension, benign Hold lisinopril. Hold hydrochlorothiazide. Monitor blood pressure.      DVT prophylaxis: Lovenox SQ. Code Status: DNR. Family Communication: Disposition Plan: Admit for COVID-19 and AKI work-up and treatment. Consults called:  Admission status: Inpatient/telemetry.   Bobette Mo MD [facility]  If 7PM-7AM, please contact  night-coverage [email or web address]  2019/07/12, 11:14 PM   This document was prepared using Dragon voice recognition software and may contain some unintended transcription errors.

## 2019-07-05 NOTE — ED Notes (Signed)
Date and time results received: 06/12/2019 11:10 PM  (use smartphrase ".now" to insert current time)  Test: lactic Critical Value: 2.4  Name of Provider Notified: ortiz  Orders Received? Or Actions Taken?: none at this time

## 2019-07-05 NOTE — ED Triage Notes (Signed)
Pt from [facility]. Per staff, his O2 was 71% on 4L and BP 71 systolic. With EMS, pt was 100% on RA and BP systolic was 93. Pt alert and oriented. Was exposed to [place], but has not tested positive.

## 2019-07-05 NOTE — ED Provider Notes (Signed)
[name]Y DEPARTMENT Provider Note   CSN: [id number] Arrival date & time: July 20, 2019  1801     History Chief Complaint  Patient presents with  . Low Oxygen   Level 5 caveat due to dementia  Tom [name] is [name] with history of COPD on 4 L supplemental oxygen at baseline, CAD, hypertension, hyperlipidemia, NSTEMI, peripheral arterial disease presents for evaluation of hypoxia and hypotension.  He presents from [facility] facility where apparently per staff he was found to have O2 saturations of 71% on his usual requirement of 4 L supplemental oxygen and had [name] BP of 71/54.  Reportedly with EMS he was on 100% on his usual supplemental oxygen requirement and BP systolic was 93.  He was apparently exposed to Covid but has not tested positive.  On my assessment the patient appears confused, alert and oriented to self and year only.  He follows most commands without difficulty.  He thinks that he is in [name].  He is able to move extremities without difficulty.  He is tachypneic, SPO2 saturations stable on 4 L nasal cannula but somewhat difficult to obtain reliable waveform.  The history is provided by the patient.       Past Medical History:  Diagnosis Date  . Asthma   . Carotid artery disease (HCC)    50-69% RICA and less than 50% LICA, 07/2011  . COPD (chronic obstructive pulmonary disease) (HCC)   . Coronary atherosclerosis of native coronary artery    Based on abnormal Myoview - inferior scar, 05/2011; LVEF 45-50%, echo, 07/2011  . Essential hypertension, benign   . HLD (hyperlipidemia)   . NSTEMI (non-ST elevated myocardial infarction) (HCC)    12/12, minor  . On home oxygen therapy    4Lnc with exertion  . Peripheral arterial disease (HCC)   . Tobacco abuse     Patient Active Problem List   Diagnosis Date Noted  . AKI (acute kidney injury) (HCC) 20-Jul-2019  . COVID-19 virus infection 07-20-2019  . Status post total  replacement of right hip 06/25/2018  . Pain in right hip 06/19/2018  . Pre-op evaluation 06/07/2018  . Primary osteoarthritis of left hip 02/19/2018  . Avascular necrosis of bone of right hip (HCC) 02/19/2018  . Status post right hip replacement 02/19/2018  . Unilateral primary osteoarthritis, left hip 01/09/2018  . Atherosclerosis of native arteries of the extremities with intermittent claudication 03/30/2014  . Aftercare following surgery of the circulatory system, NEC 03/30/2014  . Muscle cramps- Right  Flank / [name] 03/30/2014  . Paresthesia of right foot 03/30/2014  . Chest pain 12/30/2013  . Unstable angina (HCC) 12/29/2013  . Chest pain at rest 12/29/2013  . Chronic respiratory failure (HCC) 12/29/2013  . COPD (chronic obstructive pulmonary disease) (HCC)   . HTN (hypertension), malignant 10/07/2013  . Dyspnea 03/06/2013  . Tobacco abuse   . HLD (hyperlipidemia)   . Peripheral vascular disease, unspecified (HCC) 03/26/2012  . Coronary atherosclerosis of native coronary artery 07/28/2011  . Mixed hyperlipidemia 07/28/2011  . Essential hypertension, benign 07/28/2011  . Peripheral arterial disease (HCC) 07/28/2011  . Carotid artery disease (HCC) 07/28/2011    Past Surgical History:  Procedure Laterality Date  . ABDOMINAL SURGERY     2008  . COLONOSCOPY    . Right axillary to femoral bypass  3/12   [name]  . RIGHT HYDROCELE REPAIR    . Right to left femoral to femoral bypass  3/12  Tom [name]  . SIGMOID RESECTION / RECTOPEXY  2005  . skin abscess drainage    . TOTAL HIP ARTHROPLASTY Left 02/19/2018   Procedure: LEFT TOTAL HIP ARTHROPLASTY;  Surgeon: Tom [name];  Location: [facility] OR;  Service: Orthopedics;  Laterality: Left;  . TOTAL HIP ARTHROPLASTY Right 06/25/2018   Procedure: RIGHT TOTAL HIP ARTHROPLASTY;  Surgeon: Tom [name];  Location: [facility] OR;  Service: Orthopedics;  Laterality: Right;       Family History  Problem Relation Age of Onset   . Heart attack Father   . Heart attack Mother     Social History   Tobacco Use  . Smoking status: Former Smoker    Packs/day: 2.00    Years: 55.00    Pack years: 110.00    Types: Cigarettes    Start date: 12/03/1959    Quit date: 12/09/2010    Years since quitting: 8.5  . Smokeless tobacco: Never Used  . Tobacco comment: Patient has not somked since midnight on wednesday.   Substance Use Topics  . Alcohol use: No    Alcohol/week: 0.0 standard drinks  . Drug use: No    Home Medications Prior to Admission medications   Medication Sig Start Date End Date Taking? Authorizing Provider  albuterol (PROAIR HFA) 108 (90 BASE) MCG/ACT inhaler Inhale 2 puffs into the lungs every 6 (six) hours as needed for shortness of breath.    Yes Provider, Historical, [name]  aspirin EC 81 MG tablet Take 81 mg by mouth daily.   Yes Provider, Historical, [name]  lisinopril-hydrochlorothiazide (PRINZIDE,ZESTORETIC) 20-12.5 MG tablet Take 1 tablet by mouth daily. 12/05/17  Yes Provider, Historical, [name]  LORazepam (ATIVAN) 1 MG tablet Take 1 mg by mouth every 8 (eight) hours as needed for anxiety.   Yes Provider, Historical, [name]  methylPREDNISolone (MEDROL) 4 MG tablet Take 4 mg by mouth at bedtime.   Yes Provider, Historical, [name]  mirtazapine (REMERON) 15 MG tablet Take 15 mg by mouth at bedtime.   Yes Provider, Historical, [name]  nitroGLYCERIN (NITROSTAT) 0.4 MG SL tablet Place 1 tablet (0.4 mg total) under the tongue every 5 (five) minutes x 3 doses as needed for chest pain. 11/24/14  Yes Tom Linden, [name]  QUEtiapine (SEROQUEL) 100 MG tablet Take 100 mg by mouth at bedtime.   Yes Provider, Historical, [name]  simvastatin (ZOCOR) 20 MG tablet TAKE 1 TABLET BY MOUTH EVERY DAY Patient taking differently: Take 20 mg by mouth at bedtime.  01/21/16  Yes Tom Linden, [name]  aspirin EC 325 MG EC tablet Take 1 tablet (325 mg total) by mouth 2 (two) times daily at 8 am and 10 pm. Patient not taking: Reported on 07/06/2019  06/27/18   Tom [name], Tom [name]  HYDROcodone-acetaminophen (NORCO/VICODIN) 5-325 MG tablet Take 1-2 tablets by mouth every 4 (four) hours as needed for moderate pain (pain score 4-6). Patient not taking: Reported on 07/06/2019 06/27/18   Tom [name], Tom [name]  isosorbide mononitrate (IMDUR) 60 MG 24 hr tablet TAKE 1 TABLET BY MOUTH DAILY (NEEDS OFFICE VISIT) Patient not taking: Reported on 07/06/2019 05/12/19   Tom Linden, [name]    Allergies    Patient has no known allergies.  Review of Systems   Review of Systems  Unable to perform ROS: Dementia    Physical Exam Updated Vital Signs BP 137/73   Pulse 86   Temp 97.6 F (36.4 C) (Oral)   Resp 17   Ht  (1.676  m)   Wt 83 kg   SpO2 100%   BMI 29.54 kg/m   Physical Exam Vitals and nursing note reviewed.  Constitutional:      General: He is not in acute distress.    Appearance: He is well-developed.  HENT:     Head: Normocephalic and atraumatic.  Eyes:     General:        Right eye: No discharge.        Left eye: No discharge.     Conjunctiva/sclera: Conjunctivae normal.  Neck:     Vascular: No JVD.     Trachea: No tracheal deviation.  Cardiovascular:     Rate and Rhythm: Normal rate and regular rhythm.     Comments: 1+ radial and DP/PT pulses bilaterally, 2+ pitting edema of the bilateral lower extremities.  Denna Haggard' sign absent bilaterally.  Compartments are soft Pulmonary:     Breath sounds: Wheezing present.     Comments: Tachypneic, speaking in short phrases, SPO2 saturations 100% on 4 L nasal cannula, scattered expiratory wheezes Abdominal:     General: There is no distension.     Palpations: Abdomen is soft.     Tenderness: There is no abdominal tenderness.  Musculoskeletal:     Cervical back: Normal range of motion and neck supple.  Skin:    General: Skin is warm and dry.     Findings: No erythema.  Neurological:     Mental Status: He is alert.     Comments: Oriented to person and time.   Cranial nerves appear grossly intact.  Moving extremities spontaneously without difficulty.  No facial droop.  Psychiatric:        Behavior: Behavior normal.     ED Results / Procedures / Treatments   Labs (all labs ordered are listed, but only abnormal results are displayed) Labs Reviewed  BASIC METABOLIC PANEL - Abnormal; Notable for the following components:      Result Value   Glucose, Bld 142 (*)    BUN 69 (*)    Creatinine, Ser 4.12 (*)    Calcium 8.1 (*)    GFR calc non Af Amer 13 (*)    GFR calc Af Amer 15 (*)    All other components within normal limits  CBC WITH DIFFERENTIAL/PLATELET - Abnormal; Notable for the following components:   WBC 13.6 (*)    RBC 3.91 (*)    Hemoglobin 11.5 (*)    HCT 37.2 (*)    RDW 17.5 (*)    Platelets 144 (*)    Neutro Abs 11.7 (*)    Abs Immature Granulocytes 0.09 (*)    All other components within normal limits  URINALYSIS, ROUTINE W REFLEX MICROSCOPIC - Abnormal; Notable for the following components:   APPearance HAZY (*)    Hgb urine dipstick SMALL (*)    Bacteria, UA RARE (*)    All other components within normal limits  LACTIC ACID, PLASMA - Abnormal; Notable for the following components:   Lactic Acid, Venous 2.4 (*)    All other components within normal limits  D-DIMER, QUANTITATIVE (NOT AT [name]) - Abnormal; Notable for the following components:   D-Dimer, Quant 7.26 (*)    All other components within normal limits  LACTATE DEHYDROGENASE - Abnormal; Notable for the following components:   LDH 309 (*)    All other components within normal limits  FIBRINOGEN - Abnormal; Notable for the following components:   Fibrinogen 774 (*)    All other components within  normal limits  C-REACTIVE PROTEIN - Abnormal; Notable for the following components:   CRP 16.3 (*)    All other components within normal limits  C-REACTIVE PROTEIN - Abnormal; Notable for the following components:   CRP 13.8 (*)    All other components within normal  limits  [facility] - Abnormal; Notable for the following components:   D-Dimer, Quant 6.48 (*)    All other components within normal limits  HEPATIC FUNCTION PANEL - Abnormal; Notable for the following components:   Total Protein 5.9 (*)    Albumin 2.5 (*)    AST 44 (*)    Alkaline Phosphatase 30 (*)    All other components within normal limits  CBC WITH DIFFERENTIAL/PLATELET - Abnormal; Notable for the following components:   RBC 4.10 (*)    Hemoglobin 11.9 (*)    MCHC 29.9 (*)    RDW 17.5 (*)    Platelets 122 (*)    Neutro Abs 9.2 (*)    Abs Immature Granulocytes 0.11 (*)    All other components within normal limits  COMPREHENSIVE METABOLIC PANEL - Abnormal; Notable for the following components:   Sodium 146 (*)    Chloride 114 (*)    CO2 18 (*)    Glucose, Bld 188 (*)    BUN 58 (*)    Creatinine, Ser 2.94 (*)    Calcium 7.9 (*)    Albumin 2.9 (*)    AST 48 (*)    Alkaline Phosphatase 35 (*)    GFR calc non Af Amer 20 (*)    GFR calc Af Amer 23 (*)    All other components within normal limits  PHOSPHORUS - Abnormal; Notable for the following components:   Phosphorus 6.3 (*)    All other components within normal limits  POC SARS CORONAVIRUS 2 AG -  ED - Abnormal; Notable for the following components:   SARS Coronavirus 2 Ag POSITIVE (*)    All other components within normal limits  TROPONIN I (HIGH SENSITIVITY) - Abnormal; Notable for the following components:   Troponin I (High Sensitivity) 89 (*)    All other components within normal limits  TROPONIN I (HIGH SENSITIVITY) - Abnormal; Notable for the following components:   Troponin I (High Sensitivity) 61 (*)    All other components within normal limits  CULTURE, BLOOD (ROUTINE X 2)  CULTURE, BLOOD (ROUTINE X 2)  URINE CULTURE  EXPECTORATED SPUTUM ASSESSMENT W REFEX TO RESP CULTURE  MRSA PCR SCREENING  BRAIN NATRIURETIC PEPTIDE  LACTIC ACID, PLASMA  PROCALCITONIN  FERRITIN    TRIGLYCERIDES  FERRITIN  CK  MAGNESIUM  CBC WITH DIFFERENTIAL/PLATELET  LEGIONELLA PNEUMOPHILA SEROGP 1 UR AG  STREP PNEUMONIAE URINARY ANTIGEN  POC SARS CORONAVIRUS 2 AG -  ED  POC SARS CORONAVIRUS 2 AG -  ED  ABO/RH    EKG EKG Interpretation  Date/Time:  Saturday 2019-07-14 18:20:45 EST Ventricular Rate:  99 PR Interval:    QRS Duration: 145 QT Interval:  377 QTC Calculation: 484 R Axis:   61 Text Interpretation: Sinus rhythm Right bundle branch block No STEMI Confirmed by Tom [name]) on 2019-07-14 7:35:09 PM   Radiology DG Chest Portable 1 View  Result Date: 2019/07/14 CLINICAL DATA:  73 year old with acute onset of shortness of breath. EXAM: PORTABLE CHEST 1 VIEW COMPARISON:  06/14/2019 and earlier. FINDINGS: Suboptimal inspiration accounts for crowded bronchovascular markings diffusely and atelectasis in the bases, and accentuates the cardiac silhouette. Taking  this into account, cardiac silhouette upper normal in size to mildly enlarged for AP portable technique, unchanged. Mild pulmonary venous hypertension without overt edema. Lungs otherwise clear. No visible pleural effusions. IMPRESSION: 1. Suboptimal inspiration accounts for bibasilar atelectasis. No acute cardiopulmonary disease otherwise. 2. Stable mild cardiomegaly without pulmonary edema. Electronically Signed   By: Hulan Saashomas  Lawrence M.D.   On: 04/05/2019 18:55    Procedures .Critical Care Performed by: Tom [name], Tom Tolles [name], Tom [name] Authorized by: Tom [name], Tom Raschke [name], Tom [name]   Critical care provider statement:    Critical care time (minutes):  45   Critical care was necessary to treat or prevent imminent or life-threatening deterioration of the following conditions:  Respiratory failure and renal failure   Critical care was time spent personally by me on the following activities:  Discussions with consultants, evaluation of patient's response to treatment, examination of patient, ordering and performing  treatments and interventions, ordering and review of laboratory studies, ordering and review of radiographic studies, pulse oximetry, re-evaluation of patient's condition, obtaining history from patient or surrogate and review of old charts   (including critical care time)  Medications Ordered in ED Medications  ascorbic acid (VITAMIN C) tablet 500 mg (500 mg Oral Given 07/06/19 0926)  zinc sulfate capsule 220 mg (220 mg Oral Given 07/06/19 0925)  guaiFENesin-dextromethorphan (ROBITUSSIN DM) 100-10 MG/5ML syrup 10 mL (has no administration in time range)  chlorpheniramine-HYDROcodone (TUSSIONEX) 10-8 MG/5ML suspension 5 mL (has no administration in time range)  albuterol (VENTOLIN HFA) 108 (90 Base) MCG/ACT inhaler 2 puff (2 puffs Inhalation Given 07/06/19 0926)  remdesivir 200 mg in sodium chloride 0.9% 250 mL IVPB (0 mg Intravenous Stopped 07/06/19 0049)    Followed by  remdesivir 100 mg in sodium chloride 0.9 % 100 mL IVPB (0 mg Intravenous Stopped 07/06/19 1134)  methylPREDNISolone sodium succinate (SOLU-MEDROL) 40 mg/mL injection 40 mg (40 mg Intravenous Given 07/06/19 0925)  enoxaparin (LOVENOX) injection 30 mg (30 mg Subcutaneous Given 07/06/19 0217)  acetaminophen (TYLENOL) tablet 650 mg (has no administration in time range)    Or  acetaminophen (TYLENOL) suppository 650 mg (has no administration in time range)  ondansetron (ZOFRAN) tablet 4 mg (has no administration in time range)    Or  ondansetron (ZOFRAN) injection 4 mg (has no administration in time range)  azithromycin (ZITHROMAX) 500 mg in sodium chloride 0.9 % 250 mL IVPB (0 mg Intravenous Stopped 07/06/19 0801)  ceFEPIme (MAXIPIME) 1 g in sodium chloride 0.9 % 100 mL IVPB (0 g Intravenous Stopped 07/06/19 0801)  Ipratropium-Albuterol (COMBIVENT) respimat 2 puff (2 puffs Inhalation Given 07/06/19 0633)  simvastatin (ZOCOR) tablet 20 mg (has no administration in time range)  aspirin EC tablet 81 mg (81 mg Oral Given 07/06/19  0926)  dextrose 5 % solution (has no administration in time range)  methylPREDNISolone sodium succinate (SOLU-MEDROL) 125 mg/2 mL injection 125 mg (125 mg Intravenous Given 08-31-2018 1900)  albuterol (VENTOLIN HFA) 108 (90 Base) MCG/ACT inhaler 2 puff (2 puffs Inhalation Given 08-31-2018 1900)  sodium chloride 0.9 % bolus 1,000 mL (0 mLs Intravenous Stopped 08-31-2018 2246)  sodium chloride 0.9 % bolus 1,000 mL (0 mLs Intravenous Stopped 07/06/19 0034)    ED Course  I have reviewed the triage vital signs and the nursing notes.  Pertinent labs & imaging results that were available during my care of the patient were reviewed by me and considered in my medical decision making (see chart for details).    MDM Rules/Calculators/[name]&P  ISAHIA HOLLERBACH was evaluated in Emergency Department on 07/06/2019 for the symptoms described in the history of present illness. He was evaluated in the context of the global COVID-19 pandemic, which necessitated consideration that the patient might be at risk for infection with the SARS-CoV-2 virus that causes COVID-19. Institutional protocols and algorithms that pertain to the evaluation of patients at risk for COVID-19 are in [name] state of rapid change based on information released by regulatory bodies including the CDC and federal and state organizations. These policies and algorithms were followed during the patient's care in the ED.  Patient sent from nursing and rehabilitation [name] for evaluation of hypoxia and hypotension.  On initial assessment his O2 saturations are stable on his usual home O2 requirement and his blood pressure is within normal limits.  He is confused, oriented to self and year only.  He follows most commands without difficulty but some of his answers do not make sense.  He does exhibit expiratory wheezes and increased work of breathing, will give Solu-Medrol and [name] few puffs of albuterol.  Lab work reviewed by me concerning for  leukocytosis, mild anemia, acutely elevated BUN and creatinine.  His rapid Covid test is positive.  His chest x-ray shows suboptimal inspiration but no evidence of pneumonia or effusions.  EKG shows normal sinus rhythm with right bundle branch block and his initial hs-troponin is elevated at 89. This could be chronic in the setting of his numerous medical conditions. Bladder scan showed >300cc of urine so Foley catheter was placed with greater than 600 cc urine output.  Suspect acute renal failure in the setting of urinary retention.  Will obtain UA and culture.  Tom [name] and myself spoke with patient's POA/granddaughter Tom [name].  She notes that patient is at his mental status baseline.  To her knowledge he has not been having any issues with his kidney function recently.  Triage note mentions that the patient had [name] Covid exposure but she notes that he tested negative for Covid 2 days ago.  She confirms patient's DNR status.  With regards to his care she would like to proceed with most interventions "as long as he is breathing on his own".  This includes pressors and central lines if indicated, but if patient's respiratory status worsens to the point that he is no longer able to support his own work of breathing without intubation then she would like to withdraw care.  Spoke with [name] with [facility] hospitalist service who agrees to assume care of patient and bring him into the hospital for further evaluation and management.  Final Clinical Impression(s) / ED Diagnoses Final diagnoses:  COVID-19 virus infection  Acute renal failure, unspecified acute renal failure type ([place])  Hypotension, unspecified hypotension type    Rx / DC Orders ED Discharge Orders    None       Debroah Baller 07/06/19 1216    Margette Fast, [name] 07/06/19 1303

## 2019-07-06 ENCOUNTER — Other Ambulatory Visit: Payer: Self-pay

## 2019-07-06 ENCOUNTER — Encounter (HOSPITAL_COMMUNITY): Payer: Self-pay | Admitting: Internal Medicine

## 2019-07-06 DIAGNOSIS — N1832 Chronic kidney disease, stage 3b: Secondary | ICD-10-CM

## 2019-07-06 DIAGNOSIS — J418 Mixed simple and mucopurulent chronic bronchitis: Secondary | ICD-10-CM

## 2019-07-06 DIAGNOSIS — N179 Acute kidney failure, unspecified: Secondary | ICD-10-CM

## 2019-07-06 DIAGNOSIS — I959 Hypotension, unspecified: Secondary | ICD-10-CM

## 2019-07-06 DIAGNOSIS — E782 Mixed hyperlipidemia: Secondary | ICD-10-CM

## 2019-07-06 DIAGNOSIS — J9611 Chronic respiratory failure with hypoxia: Secondary | ICD-10-CM

## 2019-07-06 DIAGNOSIS — I2510 Atherosclerotic heart disease of native coronary artery without angina pectoris: Secondary | ICD-10-CM

## 2019-07-06 LAB — CBC WITH DIFFERENTIAL/PLATELET
Abs Immature Granulocytes: 0.11 10*3/uL — ABNORMAL HIGH (ref 0.00–0.07)
Basophils Absolute: 0 10*3/uL (ref 0.0–0.1)
Basophils Relative: 0 %
Eosinophils Absolute: 0 10*3/uL (ref 0.0–0.5)
Eosinophils Relative: 0 %
HCT: 39.8 % (ref 39.0–52.0)
Hemoglobin: 11.9 g/dL — ABNORMAL LOW (ref 13.0–17.0)
Immature Granulocytes: 1 %
Lymphocytes Relative: 10 %
Lymphs Abs: 1 10*3/uL (ref 0.7–4.0)
MCH: 29 pg (ref 26.0–34.0)
MCHC: 29.9 g/dL — ABNORMAL LOW (ref 30.0–36.0)
MCV: 97.1 fL (ref 80.0–100.0)
Monocytes Absolute: 0.2 10*3/uL (ref 0.1–1.0)
Monocytes Relative: 1 %
Neutro Abs: 9.2 10*3/uL — ABNORMAL HIGH (ref 1.7–7.7)
Neutrophils Relative %: 88 %
Platelets: 122 10*3/uL — ABNORMAL LOW (ref 150–400)
RBC: 4.1 MIL/uL — ABNORMAL LOW (ref 4.22–5.81)
RDW: 17.5 % — ABNORMAL HIGH (ref 11.5–15.5)
WBC: 10.5 10*3/uL (ref 4.0–10.5)
nRBC: 0 % (ref 0.0–0.2)

## 2019-07-06 LAB — HEPATIC FUNCTION PANEL
ALT: 37 U/L (ref 0–44)
AST: 44 U/L — ABNORMAL HIGH (ref 15–41)
Albumin: 2.5 g/dL — ABNORMAL LOW (ref 3.5–5.0)
Alkaline Phosphatase: 30 U/L — ABNORMAL LOW (ref 38–126)
Bilirubin, Direct: 0.2 mg/dL (ref 0.0–0.2)
Indirect Bilirubin: 0.4 mg/dL (ref 0.3–0.9)
Total Bilirubin: 0.6 mg/dL (ref 0.3–1.2)
Total Protein: 5.9 g/dL — ABNORMAL LOW (ref 6.5–8.1)

## 2019-07-06 LAB — COMPREHENSIVE METABOLIC PANEL
ALT: 40 U/L (ref 0–44)
AST: 48 U/L — ABNORMAL HIGH (ref 15–41)
Albumin: 2.9 g/dL — ABNORMAL LOW (ref 3.5–5.0)
Alkaline Phosphatase: 35 U/L — ABNORMAL LOW (ref 38–126)
Anion gap: 14 (ref 5–15)
BUN: 58 mg/dL — ABNORMAL HIGH (ref 8–23)
CO2: 18 mmol/L — ABNORMAL LOW (ref 22–32)
Calcium: 7.9 mg/dL — ABNORMAL LOW (ref 8.9–10.3)
Chloride: 114 mmol/L — ABNORMAL HIGH (ref 98–111)
Creatinine, Ser: 2.94 mg/dL — ABNORMAL HIGH (ref 0.61–1.24)
GFR calc Af Amer: 23 mL/min — ABNORMAL LOW (ref >60)
GFR calc non Af Amer: 20 mL/min — ABNORMAL LOW (ref >60)
Glucose, Bld: 188 mg/dL — ABNORMAL HIGH (ref 70–99)
Potassium: 4.3 mmol/L (ref 3.5–5.1)
Sodium: 146 mmol/L — ABNORMAL HIGH (ref 135–145)
Total Bilirubin: 0.3 mg/dL (ref 0.3–1.2)
Total Protein: 6.8 g/dL (ref 6.5–8.1)

## 2019-07-06 LAB — EXPECTORATED SPUTUM ASSESSMENT W GRAM STAIN, RFLX TO RESP C

## 2019-07-06 LAB — TROPONIN I (HIGH SENSITIVITY): Troponin I (High Sensitivity): 61 ng/L — ABNORMAL HIGH (ref <18)

## 2019-07-06 LAB — CK: Total CK: 188 U/L (ref 49–397)

## 2019-07-06 LAB — MAGNESIUM: Magnesium: 1.9 mg/dL (ref 1.7–2.4)

## 2019-07-06 LAB — FERRITIN: Ferritin: 142 ng/mL (ref 24–336)

## 2019-07-06 LAB — D-DIMER, QUANTITATIVE: D-Dimer, Quant: 6.48 ug/mL-FEU — ABNORMAL HIGH (ref 0.00–0.50)

## 2019-07-06 LAB — PHOSPHORUS: Phosphorus: 6.3 mg/dL — ABNORMAL HIGH (ref 2.5–4.6)

## 2019-07-06 LAB — C-REACTIVE PROTEIN: CRP: 13.8 mg/dL — ABNORMAL HIGH (ref <1.0)

## 2019-07-06 MED ORDER — CHLORHEXIDINE GLUCONATE CLOTH 2 % EX PADS
6.0000 | MEDICATED_PAD | Freq: Every day | CUTANEOUS | Status: DC
  Administered 2019-07-06 – 2019-07-09 (×4): 6 via TOPICAL

## 2019-07-06 MED ORDER — ASPIRIN EC 81 MG PO TBEC
81.0000 mg | DELAYED_RELEASE_TABLET | Freq: Every day | ORAL | Status: DC
  Administered 2019-07-06 – 2019-07-08 (×3): 81 mg via ORAL
  Filled 2019-07-06 (×5): qty 1

## 2019-07-06 MED ORDER — ONDANSETRON HCL 4 MG PO TABS: 4.0000 mg | ORAL_TABLET | Freq: Four times a day (QID) | ORAL | Status: DC | PRN

## 2019-07-06 MED ORDER — TOCILIZUMAB 400 MG/20ML IV SOLN
8.0000 mg/kg | Freq: Once | INTRAVENOUS | Status: DC
  Filled 2019-07-06: qty 33.2

## 2019-07-06 MED ORDER — DEXTROSE 5 % IV SOLN: INTRAVENOUS | Status: AC

## 2019-07-06 MED ORDER — ACETAMINOPHEN 650 MG RE SUPP: 650.0000 mg | Freq: Four times a day (QID) | RECTAL | Status: DC | PRN

## 2019-07-06 MED ORDER — ENOXAPARIN SODIUM 30 MG/0.3ML ~~LOC~~ SOLN
30.0000 mg | SUBCUTANEOUS | Status: DC
  Administered 2019-07-06 (×2): 30 mg via SUBCUTANEOUS
  Filled 2019-07-06 (×2): qty 0.3

## 2019-07-06 MED ORDER — SIMVASTATIN 20 MG PO TABS
20.0000 mg | ORAL_TABLET | Freq: Every day | ORAL | Status: DC
  Administered 2019-07-06 – 2019-07-09 (×4): 20 mg via ORAL
  Filled 2019-07-06 (×5): qty 1

## 2019-07-06 MED ORDER — SODIUM CHLORIDE 0.9 % IV SOLN
500.0000 mg | Freq: Every day | INTRAVENOUS | Status: AC
  Administered 2019-07-06 – 2019-07-10 (×5): 500 mg via INTRAVENOUS
  Filled 2019-07-06 (×5): qty 500

## 2019-07-06 MED ORDER — IPRATROPIUM-ALBUTEROL 20-100 MCG/ACT IN AERS
2.0000 | INHALATION_SPRAY | Freq: Four times a day (QID) | RESPIRATORY_TRACT | Status: DC | PRN
  Administered 2019-07-06 – 2019-07-07 (×3): 2 via RESPIRATORY_TRACT
  Filled 2019-07-06 (×2): qty 4

## 2019-07-06 MED ORDER — ACETAMINOPHEN 325 MG PO TABS
650.0000 mg | ORAL_TABLET | Freq: Four times a day (QID) | ORAL | Status: DC | PRN
  Administered 2019-07-06 – 2019-07-07 (×2): 650 mg via ORAL
  Filled 2019-07-06 (×4): qty 2

## 2019-07-06 MED ORDER — SODIUM CHLORIDE 0.9 % IV SOLN
1.0000 g | Freq: Every day | INTRAVENOUS | Status: DC
  Administered 2019-07-06 – 2019-07-07 (×2): 1 g via INTRAVENOUS
  Filled 2019-07-06 (×5): qty 1

## 2019-07-06 MED ORDER — ONDANSETRON HCL 4 MG/2ML IJ SOLN
4.0000 mg | Freq: Four times a day (QID) | INTRAMUSCULAR | Status: DC | PRN
  Administered 2019-07-09: 4 mg via INTRAVENOUS
  Filled 2019-07-06: qty 2

## 2019-07-06 NOTE — Progress Notes (Signed)
PROGRESS NOTE    Maretta LosBillie R Kot  ZOX:[id number] DOB: 1945/12/11 DOA: 07/06/2019 PCP: [name], Ashish, MD     Brief Narrative:  73 y.o. male with medical history significant of asthma, carotid artery disease, COPD on home oxygen typically 4 LPM, CAD, essential hypertension, hyperlipidemia, CAD, history of NSTEMI, peripheral arterial disease, tobacco use who was [facility] due to hypoxia of 71% and hypotension with BP of 71/54 mmHg.  Per EMS, he was 100% on his usual 4 LPM.  His systolic blood pressures 93 mmHg.  He has history of exposure to Covid.  He is unable to provide further history at this time.  ED Course: Initial vital signs were temperature 97.6 F, pulse 99, respiration 21, blood pressure 124/62 mmHg and O2 sat 100% on 4 LPM via nasal cannula.  The patient received Solu-Medrol and 1000 mL of NS bolus.  Urinalysis shows small hemoglobinuria and rare bacteria on microscopic examination.  His white count is 13.6 g/dL, hemoglobin 81.111.5 g% and platelets 144. Fibrinogen was 1774 and D-dimer 7.26. BNP was 66 pg/mL.  Troponin was 89 and then 61 ng/L.  Lactic acid was 2.4 and 1.5 mmol/L. LDH 309, triglycerides 144, procalcitonin 0.73, ferritin 136 and CRP 16.3.Chest radiograph shows bibasilar atelectasis.  Assessment & Plan: 1-COVID-19 virus infection -Continue oxygen supplementation -Continue as needed bronchodilators -Continue steroids and remdesivir -For now we will continue empirical management with IV antibiotics -Continue to follow inflammatory markers closely. -vit C and Zinc ordered.   2-Coronary atherosclerosis of native coronary artery -No chest pain -Telemetry so far demonstrated no acute ischemic changes -Continue statins and aspirin.  3-Mixed hyperlipidemia -CK level WNL -Continue statins   4-Essential hypertension, benign -BP stable -holding diuretics and ACE inhibitor agent in the setting of AKI -gentle IVF's intermittently to be  provided.  5-chronic resp failure due to COPD (chronic obstructive pulmonary disease) (HCC) -currently no wheezing -no nebulization in the setting of positive COVID -continue steroids and chronic O2 supplementation.  6-AKI (acute kidney injury) on CKD stage 3b (HCC) -follow renal function trend -no signs of Acute UTI on UA -continue holding nephrotoxic agents -continue intermittent IVF's   7-hypernatremia -in the setting of dehydration and poor oral intake -continue holding diuretics  -follow electrolytes trend    DVT prophylaxis: lovenox Code Status: DNR Family Communication:  Disposition Plan: Continue IV steroids, remdesivir, close monitoring of electrolytes and renal function and continue current IV antibiotics.  Follow inflammatory markers.  Consultants:   None  Procedures:   See below for x-ray reports.  Antimicrobials:  Anti-infectives (From admission, onward)   Start     Dose/Rate Route Frequency Ordered Stop   07/06/19 1000  remdesivir 100 mg in sodium chloride 0.9 % 100 mL IVPB     100 mg 200 mL/hr over 30 Minutes Intravenous Daily 06/10/2019 2257 2018-07-21 0959   07/06/19 0400  ceFEPIme (MAXIPIME) 1 g in sodium chloride 0.9 % 100 mL IVPB     1 g 200 mL/hr over 30 Minutes Intravenous Daily 07/06/19 0328 07/11/19 0559   07/06/19 0345  azithromycin (ZITHROMAX) 500 mg in sodium chloride 0.9 % 250 mL IVPB     500 mg 250 mL/hr over 60 Minutes Intravenous Daily 07/06/19 0328 07/11/19 0559   06/10/2019 2330  remdesivir 200 mg in sodium chloride 0.9% 250 mL IVPB     200 mg 580 mL/hr over 30 Minutes Intravenous Once 06/17/2019 2257 07/06/19 0049       Subjective: Afebrile currently, no chest pain, no  nausea, no vomiting.  Patient still somnolent and oriented x1 only.  Good O2 sat on 4 L nasal supplementation.  Objective: Vitals:   07/06/19 0900 07/06/19 0915 07/06/19 0930 07/06/19 0945  BP: 119/63  137/73   Pulse: 86 88 88 86  Resp: 20 17 18 17   Temp:       TempSrc:      SpO2: 100% 100% 100% 100%  Weight:      Height:        Intake/Output Summary (Last 24 hours) at 07/06/2019 1029 Last data filed at 07/06/2019 0801 Gross per 24 hour  Intake 2631.99 ml  Output --  Net 2631.99 ml   Filed Weights   06/16/2019 1818  Weight: 83 kg    Examination: General exam: Alert, oriented x1; in no acute distress.  Patient currently afebrile.  No chest pain, no abdominal pain, no nausea, no vomiting.  Good oxygen saturation on 4 L nasal cannula supplementation. Respiratory system: Positive rhonchi bilaterally; decreased breath sounds at the bases.  No wheezing.  No using accessory muscle.  Cardiovascular system: RRR. No murmurs, rubs, gallops. Gastrointestinal system: Abdomen is nondistended, soft and nontender. No organomegaly or masses felt. Normal bowel sounds heard. Central nervous system: Difficult to fully assess in the setting of current encephalopathy; but moving 4 limbs spontaneously.  Patient with underlying history of dementia (unknown baseline to me). Extremities: No cyanosis, clubbing or edema. Skin: No rashes, no petechiae. Psychiatry: No agitation, mood & affect appropriate.     Data Reviewed: I have personally reviewed following labs and imaging studies  CBC: Recent Labs  Lab 06/29/2019 1926 07/06/19 0548  WBC 13.6* 10.5  NEUTROABS 11.7* 9.2*  HGB 11.5* 11.9*  HCT 37.2* 39.8  MCV 95.1 97.1  PLT 144* 122*   Basic Metabolic Panel: Recent Labs  Lab 06/10/2019 1926 07/06/19 0548  NA 143 146*  K 4.6 4.3  CL 109 114*  CO2 22 18*  GLUCOSE 142* 188*  BUN 69* 58*  CREATININE 4.12* 2.94*  CALCIUM 8.1* 7.9*  MG  --  1.9  PHOS  --  6.3*   GFR: Estimated Creatinine Clearance: 22.3 mL/min (A) (by C-G formula based on SCr of 2.94 mg/dL (H)).   Liver Function Tests: Recent Labs  Lab 07/06/19 0342 07/06/19 0548  AST 44* 48*  ALT 37 40  ALKPHOS 30* 35*  BILITOT 0.6 0.3  PROT 5.9* 6.8  ALBUMIN 2.5* 2.9*   Cardiac  Enzymes: Recent Labs  Lab 07/06/19 0342  CKTOTAL 188   Lipid Profile: Recent Labs    07/04/2019 1926  TRIG 144   Anemia Panel: Recent Labs    06/27/2019 1926 07/06/19 0342  FERRITIN 136 142   Urine analysis:    Component Value Date/Time   COLORURINE YELLOW 06/19/2019 2204   APPEARANCEUR HAZY (A) 07/09/2019 2204   LABSPEC 1.015 07/01/2019 2204   PHURINE 5.0 07/09/2019 2204   GLUCOSEU NEGATIVE 07/08/2019 2204   HGBUR SMALL (A) 07/08/2019 2204   BILIRUBINUR NEGATIVE 06/12/2019 2204   KETONESUR NEGATIVE 06/18/2019 2204   PROTEINUR NEGATIVE 07/04/2019 2204   UROBILINOGEN 0.2 09/14/2010 0706   NITRITE NEGATIVE 06/30/2019 2204   LEUKOCYTESUR NEGATIVE 06/14/2019 2204    Recent Results (from the past 240 hour(s))  Blood Culture (routine x 2)     Status: None (Preliminary result)   Collection Time: 06/10/2019  7:55 PM   Specimen: Blood  Result Value Ref Range Status   Specimen Description LEFT ANTECUBITAL  Final   Special Requests  Final    BOTTLES DRAWN AEROBIC AND ANAEROBIC Blood Culture adequate volume Performed at [facility], [address], [place], [place] [address]    Culture PENDING  Incomplete   Report Status PENDING  Incomplete  Blood Culture (routine x 2)     Status: None (Preliminary result)   Collection Time: 07-17-2019 11:09 PM   Specimen: Blood  Result Value Ref Range Status   Specimen Description BLOOD RIGHT ARM  Final   Special Requests   Final    BOTTLES DRAWN AEROBIC AND ANAEROBIC Blood Culture adequate volume Performed at [facility], [address] [place], [place] [address]    Culture PENDING  Incomplete   Report Status PENDING  Incomplete     Radiology Studies: DG Chest Portable 1 View  Result Date: 07/17/2019 CLINICAL DATA:  73 year old with acute onset of shortness of breath. EXAM: PORTABLE CHEST 1 VIEW COMPARISON:  06/14/2019 and earlier. FINDINGS: Suboptimal inspiration accounts for crowded bronchovascular markings diffusely and atelectasis  in the bases, and accentuates the cardiac silhouette. Taking this into account, cardiac silhouette upper normal in size to mildly enlarged for AP portable technique, unchanged. Mild pulmonary venous hypertension without overt edema. Lungs otherwise clear. No visible pleural effusions. IMPRESSION: 1. Suboptimal inspiration accounts for bibasilar atelectasis. No acute cardiopulmonary disease otherwise. 2. Stable mild cardiomegaly without pulmonary edema. Electronically Signed   By: Evangeline Dakin M.D.   On: 07/17/19 18:55    Scheduled Meds: . albuterol  2 puff Inhalation Q6H  . vitamin C  500 mg Oral Daily  . aspirin EC  81 mg Oral Daily  . enoxaparin (LOVENOX) injection  30 mg Subcutaneous Q24H  . methylPREDNISolone (SOLU-MEDROL) injection  40 mg Intravenous Q12H  . simvastatin  20 mg Oral q1800  . zinc sulfate  220 mg Oral Daily   Continuous Infusions: . azithromycin Stopped (07/06/19 0801)  . ceFEPime (MAXIPIME) IV Stopped (07/06/19 0801)  . remdesivir 100 mg in NS 100 mL 100 mg (07/06/19 0925)     LOS: 1 day    Time spent: 35 minutes. Greater than 50% of this time was spent in direct contact with the patient, coordinating care and discussing relevant ongoing clinical issues, including COVID-19 infection, chronic respiratory failure and AKI.  Patient with hypernatremia and still oriented only to person.    Barton Dubois, MD [facility] Pager [phone]   07/06/2019, 10:29 AM

## 2019-07-06 NOTE — Progress Notes (Signed)
PHARMACY NOTE:  ANTIMICROBIAL RENAL DOSAGE ADJUSTMENT  Current antimicrobial regimen includes a mismatch between antimicrobial dosage and estimated renal function.  As per policy approved by the Pharmacy & Therapeutics and Medical Executive Committees, the antimicrobial dosage will be adjusted accordingly.  Current antimicrobial dosage:  Cefepime 2gm IV q8h  Indication: PNA  Renal Function:  Estimated Creatinine Clearance: 15.9 mL/min (A) (by C-G formula based on SCr of 4.12 mg/dL (H)).     Antimicrobial dosage has been changed to:  Cefepime 1gm IV q24h  Additional comments:   Thank you for allowing pharmacy to be a part of this patient's care.  Sherlon Handing, PharmD, BCPS Please see amion for complete clinical pharmacist phone list 07/06/2019 3:42 AM

## 2019-07-07 LAB — URINE CULTURE: Culture: NO GROWTH

## 2019-07-07 LAB — COMPREHENSIVE METABOLIC PANEL
ALT: 38 U/L (ref 0–44)
AST: 45 U/L — ABNORMAL HIGH (ref 15–41)
Albumin: 2.5 g/dL — ABNORMAL LOW (ref 3.5–5.0)
Alkaline Phosphatase: 29 U/L — ABNORMAL LOW (ref 38–126)
Anion gap: 14 (ref 5–15)
BUN: 54 mg/dL — ABNORMAL HIGH (ref 8–23)
CO2: 19 mmol/L — ABNORMAL LOW (ref 22–32)
Calcium: 8.2 mg/dL — ABNORMAL LOW (ref 8.9–10.3)
Chloride: 112 mmol/L — ABNORMAL HIGH (ref 98–111)
Creatinine, Ser: 1.61 mg/dL — ABNORMAL HIGH (ref 0.61–1.24)
GFR calc Af Amer: 48 mL/min — ABNORMAL LOW (ref >60)
GFR calc non Af Amer: 42 mL/min — ABNORMAL LOW (ref >60)
Glucose, Bld: 166 mg/dL — ABNORMAL HIGH (ref 70–99)
Potassium: 3.8 mmol/L (ref 3.5–5.1)
Sodium: 145 mmol/L (ref 135–145)
Total Bilirubin: 0.5 mg/dL (ref 0.3–1.2)
Total Protein: 5.8 g/dL — ABNORMAL LOW (ref 6.5–8.1)

## 2019-07-07 LAB — MRSA PCR SCREENING: MRSA by PCR: POSITIVE — AB

## 2019-07-07 LAB — C-REACTIVE PROTEIN: CRP: 7.5 mg/dL — ABNORMAL HIGH (ref <1.0)

## 2019-07-07 LAB — STREP PNEUMONIAE URINARY ANTIGEN: Strep Pneumo Urinary Antigen: NEGATIVE

## 2019-07-07 LAB — FERRITIN: Ferritin: 178 ng/mL (ref 24–336)

## 2019-07-07 LAB — MAGNESIUM: Magnesium: 1.9 mg/dL (ref 1.7–2.4)

## 2019-07-07 LAB — PHOSPHORUS: Phosphorus: 3.9 mg/dL (ref 2.5–4.6)

## 2019-07-07 LAB — D-DIMER, QUANTITATIVE: D-Dimer, Quant: 5.82 ug/mL-FEU — ABNORMAL HIGH (ref 0.00–0.50)

## 2019-07-07 MED ORDER — IPRATROPIUM-ALBUTEROL 20-100 MCG/ACT IN AERS
2.0000 | INHALATION_SPRAY | Freq: Four times a day (QID) | RESPIRATORY_TRACT | Status: DC
  Administered 2019-07-07 – 2019-07-08 (×6): 2 via RESPIRATORY_TRACT

## 2019-07-07 MED ORDER — ALBUTEROL SULFATE HFA 108 (90 BASE) MCG/ACT IN AERS
2.0000 | INHALATION_SPRAY | RESPIRATORY_TRACT | Status: DC | PRN
  Administered 2019-07-07 – 2019-07-09 (×4): 2 via RESPIRATORY_TRACT

## 2019-07-07 MED ORDER — SODIUM CHLORIDE 0.9 % IV SOLN
2.0000 g | Freq: Two times a day (BID) | INTRAVENOUS | Status: DC
  Administered 2019-07-07 – 2019-07-09 (×5): 2 g via INTRAVENOUS
  Filled 2019-07-07 (×7): qty 2

## 2019-07-07 MED ORDER — ENOXAPARIN SODIUM 40 MG/0.4ML ~~LOC~~ SOLN
40.0000 mg | SUBCUTANEOUS | Status: DC
  Administered 2019-07-07 – 2019-07-09 (×3): 40 mg via SUBCUTANEOUS
  Filled 2019-07-07 (×3): qty 0.4

## 2019-07-07 MED ORDER — MUPIROCIN 2 % EX OINT
1.0000 "application " | TOPICAL_OINTMENT | Freq: Two times a day (BID) | CUTANEOUS | Status: DC
  Administered 2019-07-07 – 2019-07-10 (×7): 1 via NASAL
  Filled 2019-07-07: qty 22

## 2019-07-07 NOTE — Progress Notes (Signed)
PROGRESS NOTE    [name] Medico  WJX:[id number] DOB: [birth year] DOA: March 27, 2019 PCP: [name], Ashish, MD     Brief Narrative:  73 y.o. male with medical history significant of asthma, carotid artery disease, COPD on home oxygen typically 4 LPM, CAD, essential hypertension, hyperlipidemia, CAD, history of NSTEMI, peripheral arterial disease, tobacco use who was [facility] due to hypoxia of 71% and hypotension with BP of 71/54 mmHg.  Per EMS, he was 100% on his usual 4 LPM.  His systolic blood pressures 93 mmHg.  He has history of exposure to Covid.  He is unable to provide further history at this time.  ED Course: Initial vital signs were temperature 97.6 F, pulse 99, respiration 21, blood pressure 124/62 mmHg and O2 sat 100% on 4 LPM via nasal cannula.  The patient received Solu-Medrol and 1000 mL of NS bolus.  Urinalysis shows small hemoglobinuria and rare bacteria on microscopic examination.  His white count is 13.6 g/dL, hemoglobin 21.311.5 g% and platelets 144. Fibrinogen was 1774 and D-dimer 7.26. BNP was 66 pg/mL.  Troponin was 89 and then 61 ng/L.  Lactic acid was 2.4 and 1.5 mmol/L. LDH 309, triglycerides 144, procalcitonin 0.73, ferritin 136 and CRP 16.3.Chest radiograph shows bibasilar atelectasis.  Assessment & Plan: 1-COVID-19 virus infection -Continue oxygen supplementation -Continue as needed bronchodilators -Continue steroids and remdesivir -For now will continue empirical management with IV antibiotics -Continue to follow inflammatory markers closely. -vit C and Zinc ordered.  -WBC's trending down  2-Coronary atherosclerosis of native coronary artery -No chest pain -Telemetry so far demonstrated no acute ischemic changes; will discontinue cardicac monitoring -Continue statins and aspirin.  3-Mixed hyperlipidemia -CK level WNL -Continue statins   4-Essential hypertension, benign -BP stable -continue holding diuretics and ACE inhibitor agent in the  setting of AKI -gentle IVF's intermittently to be provided.  5-chronic resp failure due to COPD (chronic obstructive pulmonary disease) (HCC) -currently no wheezing -no nebulization in the setting of positive COVID -continue steroids and chronic O2 supplementation. -continue PRN bronchodilators.   6-AKI (acute kidney injury) on CKD stage 3b (HCC) -follow renal function trend -no signs of Acute UTI on UA -continue holding nephrotoxic agents -continue intermittent IVF's  -Cr down to 1.6  7-hypernatremia -in the setting of dehydration and poor oral intake -continue holding diuretics  -follow electrolytes trend    DVT prophylaxis: lovenox Code Status: DNR Family Communication:  Disposition Plan: Continue IV steroids, remdesivir, close monitoring of electrolytes and renal function and continue current IV antibiotics.  Follow inflammatory markers.  Consultants:   None  Procedures:   See below for x-ray reports.  Antimicrobials:  Anti-infectives (From admission, onward)   Start     Dose/Rate Route Frequency Ordered Stop   07/07/19 1800  ceFEPIme (MAXIPIME) 2 g in sodium chloride 0.9 % 100 mL IVPB     2 g 200 mL/hr over 30 Minutes Intravenous Every 12 hours 07/07/19 0905     07/06/19 1000  remdesivir 100 mg in sodium chloride 0.9 % 100 mL IVPB     100 mg 200 mL/hr over 30 Minutes Intravenous Daily 11-Nov-2018 2257 07/09/2019 0959   07/06/19 0400  ceFEPIme (MAXIPIME) 1 g in sodium chloride 0.9 % 100 mL IVPB  Status:  Discontinued     1 g 200 mL/hr over 30 Minutes Intravenous Daily 07/06/19 0328 07/07/19 0905   07/06/19 0345  azithromycin (ZITHROMAX) 500 mg in sodium chloride 0.9 % 250 mL IVPB     500 mg 250  mL/hr over 60 Minutes Intravenous Daily 07/06/19 0328 07/11/19 0559   06/11/2019 2330  remdesivir 200 mg in sodium chloride 0.9% 250 mL IVPB     200 mg 580 mL/hr over 30 Minutes Intravenous Once 06/25/2019 2257 07/06/19 0049       Subjective: Using 4 L [place], easily winded and  SOB with exertion; no CP, no fever, positive productive cough and mild hemoptysis.   Objective: Vitals:   07/07/19 1132 07/07/19 1137 07/07/19 1300 07/07/19 1406  BP:    (!) 188/75  Pulse:    97  Resp:    20  Temp:    98.5 F (36.9 C)  TempSrc:      SpO2: 95% 93% 92% 94%  Weight:      Height:        Intake/Output Summary (Last 24 hours) at 07/07/2019 1755 Last data filed at 07/07/2019 1300 Gross per 24 hour  Intake 240 ml  Output 1752 ml  Net -1512 ml   Filed Weights   07/01/2019 1818 07/06/19 1506  Weight: 83 kg 84.9 kg    Examination: General exam: Alert, awake, oriented x 1, more interactive and talkative today. continue to be SOB, with productive cough and mild hemoptysis.  Respiratory system: diffuse rhonchi, no wheezing, no using accessory muscles; positive tachypnea with exertion. Cardiovascular system:RRR. No murmurs, rubs, gallops. Gastrointestinal system: Abdomen is nondistended, soft and nontender. No organomegaly or masses felt. Normal bowel sounds heard. Central nervous system: Alert and oriented. No focal neurological deficits. Extremities: No C/C/E, +pedal pulses Skin: No rashes, no petechiae Psychiatry:  Mood & affect appropriate.    Data Reviewed: I have personally reviewed following labs and imaging studies  CBC: Recent Labs  Lab 06/22/2019 1926 07/06/19 0548  WBC 13.6* 10.5  NEUTROABS 11.7* 9.2*  HGB 11.5* 11.9*  HCT 37.2* 39.8  MCV 95.1 97.1  PLT 144* 867*   Basic Metabolic Panel: Recent Labs  Lab 06/20/2019 1926 07/06/19 0548 07/07/19 0631  NA 143 146* 145  K 4.6 4.3 3.8  CL 109 114* 112*  CO2 22 18* 19*  GLUCOSE 142* 188* 166*  BUN 69* 58* 54*  CREATININE 4.12* 2.94* 1.61*  CALCIUM 8.1* 7.9* 8.2*  MG  --  1.9 1.9  PHOS  --  6.3* 3.9   GFR: Estimated Creatinine Clearance: 40.4 mL/min (A) (by C-G formula based on SCr of 1.61 mg/dL (H)).   Liver Function Tests: Recent Labs  Lab 07/06/19 0342 07/06/19 0548 07/07/19 0631  AST  44* 48* 45*  ALT 37 40 38  ALKPHOS 30* 35* 29*  BILITOT 0.6 0.3 0.5  PROT 5.9* 6.8 5.8*  ALBUMIN 2.5* 2.9* 2.5*   Cardiac Enzymes: Recent Labs  Lab 07/06/19 0342  CKTOTAL 188   Lipid Profile: Recent Labs    06/14/2019 1926  TRIG 144   Anemia Panel: Recent Labs    07/06/19 0342 07/07/19 0631  FERRITIN 142 178   Urine analysis:    Component Value Date/Time   COLORURINE YELLOW 07/07/2019 2204   APPEARANCEUR HAZY (A) 07/09/2019 2204   LABSPEC 1.015 06/27/2019 2204   PHURINE 5.0 07/03/2019 2204   GLUCOSEU NEGATIVE 07/03/2019 2204   HGBUR SMALL (A) 06/23/2019 2204   BILIRUBINUR NEGATIVE 06/24/2019 2204   KETONESUR NEGATIVE 06/15/2019 2204   PROTEINUR NEGATIVE 06/25/2019 2204   UROBILINOGEN 0.2 09/14/2010 0706   NITRITE NEGATIVE 06/11/2019 2204   LEUKOCYTESUR NEGATIVE 06/13/2019 2204    Recent Results (from the past 240 hour(s))  Blood Culture (routine x  2)     Status: None (Preliminary result)   Collection Time: 06/17/2019  7:55 PM   Specimen: Left Antecubital; Blood  Result Value Ref Range Status   Specimen Description LEFT ANTECUBITAL  Final   Special Requests   Final    BOTTLES DRAWN AEROBIC AND ANAEROBIC Blood Culture adequate volume   Culture   Final    NO GROWTH 2 DAYS Performed at [facility] [place] [facility], [address] [place], [place] [address]    Report Status PENDING  Incomplete  Urine culture     Status: None   Collection Time: 07/09/2019 10:04 PM   Specimen: Urine, Clean Catch  Result Value Ref Range Status   Specimen Description   Final    URINE, CLEAN CATCH Performed at [facility], [address] [place], [place] [address]    Special Requests   Final    NONE Performed at [facility], [address] [place], [place] [address]    Culture   Final    NO GROWTH Performed at [facility], [address] [place], [place] [address]    Report Status 07/07/2019 FINAL  Final  Blood Culture (routine x 2)     Status: None (Preliminary result)    Collection Time: 07/04/2019 11:09 PM   Specimen: BLOOD RIGHT ARM  Result Value Ref Range Status   Specimen Description BLOOD RIGHT ARM  Final   Special Requests   Final    BOTTLES DRAWN AEROBIC AND ANAEROBIC Blood Culture adequate volume   Culture   Final    NO GROWTH 2 DAYS Performed at [facility], [address], [place], [place] [address]    Report Status PENDING  Incomplete  Culture, sputum-assessment     Status: None   Collection Time: 07/06/19  8:15 PM   Specimen: Expectorated Sputum  Result Value Ref Range Status   Specimen Description EXPECTORATED SPUTUM  Final   Special Requests Immunocompromised  Final   Sputum evaluation   Final    THIS SPECIMEN IS ACCEPTABLE FOR SPUTUM CULTURE Performed at [facility], [address], [place], [place] [address]    Report Status 07/06/2019 FINAL  Final  Culture, respiratory     Status: None (Preliminary result)   Collection Time: 07/06/19  8:15 PM  Result Value Ref Range Status   Specimen Description   Final    EXPECTORATED SPUTUM Performed at [facility], [address], [place], [place] [address]    Special Requests   Final    Immunocompromised Reflexed from [id number] Performed at [facility], [address], [place], [place] [address]    Gram Stain   Final    RARE WBC PRESENT, PREDOMINANTLY PMN RARE GRAM POSITIVE COCCI IN PAIRS Performed at [facility], [address], [place], [place] [address]    Culture PENDING  Incomplete   Report Status PENDING  Incomplete  MRSA PCR Screening     Status: Abnormal   Collection Time: 07/06/19 11:30 PM   Specimen: Nasopharyngeal  Result Value Ref Range Status   MRSA by PCR POSITIVE (A) NEGATIVE Final    Comment:        The GeneXpert MRSA Assay (FDA approved for NASAL specimens only), is one component of a comprehensive MRSA colonization surveillance program. It is not intended to diagnose MRSA infection nor to guide or monitor treatment for MRSA infections. RESULT CALLED TO,  READ BACK BY AND VERIFIED WITH: BONDURANT,[name]. AT 0122 ON 07/07/2019 BY EVA Performed at [facility],  [address] [place] [address], [place], [place] [address]      Radiology Studies: DG Chest Portable 1 View  Result Date: 07/09/2019 CLINICAL DATA:  73 year old with acute onset of shortness of breath. EXAM: PORTABLE CHEST 1 VIEW COMPARISON:  06/14/2019 and earlier. FINDINGS: Suboptimal inspiration accounts for crowded bronchovascular markings diffusely and atelectasis in the bases, and accentuates the cardiac silhouette. Taking this into account, cardiac silhouette upper normal in size to mildly enlarged for AP portable technique, unchanged. Mild pulmonary venous hypertension without overt edema. Lungs otherwise clear. No visible pleural effusions. IMPRESSION: 1. Suboptimal inspiration accounts for bibasilar atelectasis. No acute cardiopulmonary disease otherwise. 2. Stable mild cardiomegaly without pulmonary edema. Electronically Signed   By: Hulan Saas M.D.   On: 06/19/2019 18:55    Scheduled Meds: . vitamin C  500 mg Oral Daily  . aspirin EC  81 mg Oral Daily  . Chlorhexidine Gluconate Cloth  6 each Topical Daily  . enoxaparin (LOVENOX) injection  40 mg Subcutaneous Q24H  . Ipratropium-Albuterol  2 puff Inhalation Q6H  . methylPREDNISolone (SOLU-MEDROL) injection  40 mg Intravenous Q12H  . mupirocin ointment  1 application Nasal BID  . simvastatin  20 mg Oral q1800  . zinc sulfate  220 mg Oral Daily   Continuous Infusions: . azithromycin 500 mg (07/07/19 0523)  . ceFEPime (MAXIPIME) IV    . remdesivir 100 mg in NS 100 mL 100 mg (07/07/19 1107)     LOS: 2 days    Time spent: 35 minutes.     Vassie Loll, MD [facility] Pager [phone]   07/07/2019, 5:55 PM

## 2019-07-07 NOTE — Progress Notes (Signed)
CRITICAL VALUE ALERT  Critical Value:  MRSA positive  Date & Time Notied:  07/07/2019 at 50  Provider Notified: Olevia Bowens, MD

## 2019-07-08 ENCOUNTER — Inpatient Hospital Stay (HOSPITAL_COMMUNITY): Payer: Medicare Other

## 2019-07-08 LAB — COMPREHENSIVE METABOLIC PANEL WITH GFR
ALT: 49 U/L — ABNORMAL HIGH (ref 0–44)
AST: 51 U/L — ABNORMAL HIGH (ref 15–41)
Albumin: 2.5 g/dL — ABNORMAL LOW (ref 3.5–5.0)
Alkaline Phosphatase: 27 U/L — ABNORMAL LOW (ref 38–126)
Anion gap: 13 (ref 5–15)
BUN: 68 mg/dL — ABNORMAL HIGH (ref 8–23)
CO2: 17 mmol/L — ABNORMAL LOW (ref 22–32)
Calcium: 7.9 mg/dL — ABNORMAL LOW (ref 8.9–10.3)
Chloride: 112 mmol/L — ABNORMAL HIGH (ref 98–111)
Creatinine, Ser: 1.39 mg/dL — ABNORMAL HIGH (ref 0.61–1.24)
GFR calc Af Amer: 57 mL/min — ABNORMAL LOW
GFR calc non Af Amer: 50 mL/min — ABNORMAL LOW
Glucose, Bld: 173 mg/dL — ABNORMAL HIGH (ref 70–99)
Potassium: 3.7 mmol/L (ref 3.5–5.1)
Sodium: 142 mmol/L (ref 135–145)
Total Bilirubin: 0.9 mg/dL (ref 0.3–1.2)
Total Protein: 5.8 g/dL — ABNORMAL LOW (ref 6.5–8.1)

## 2019-07-08 LAB — LACTIC ACID, PLASMA: Lactic Acid, Venous: 2.6 mmol/L (ref 0.5–1.9)

## 2019-07-08 LAB — COMPREHENSIVE METABOLIC PANEL
ALT: 48 U/L — ABNORMAL HIGH (ref 0–44)
AST: 56 U/L — ABNORMAL HIGH (ref 15–41)
Albumin: 2.7 g/dL — ABNORMAL LOW (ref 3.5–5.0)
Alkaline Phosphatase: 30 U/L — ABNORMAL LOW (ref 38–126)
Anion gap: 14 (ref 5–15)
BUN: 59 mg/dL — ABNORMAL HIGH (ref 8–23)
CO2: 19 mmol/L — ABNORMAL LOW (ref 22–32)
Calcium: 8.1 mg/dL — ABNORMAL LOW (ref 8.9–10.3)
Chloride: 113 mmol/L — ABNORMAL HIGH (ref 98–111)
Creatinine, Ser: 1.41 mg/dL — ABNORMAL HIGH (ref 0.61–1.24)
GFR calc Af Amer: 56 mL/min — ABNORMAL LOW (ref >60)
GFR calc non Af Amer: 49 mL/min — ABNORMAL LOW (ref >60)
Glucose, Bld: 148 mg/dL — ABNORMAL HIGH (ref 70–99)
Potassium: 4.2 mmol/L (ref 3.5–5.1)
Sodium: 146 mmol/L — ABNORMAL HIGH (ref 135–145)
Total Bilirubin: 0.6 mg/dL (ref 0.3–1.2)
Total Protein: 6.3 g/dL — ABNORMAL LOW (ref 6.5–8.1)

## 2019-07-08 LAB — CBC
HCT: 30.4 % — ABNORMAL LOW (ref 39.0–52.0)
Hemoglobin: 9.5 g/dL — ABNORMAL LOW (ref 13.0–17.0)
MCH: 29.1 pg (ref 26.0–34.0)
MCHC: 31.3 g/dL (ref 30.0–36.0)
MCV: 93.3 fL (ref 80.0–100.0)
Platelets: 147 K/uL — ABNORMAL LOW (ref 150–400)
RBC: 3.26 MIL/uL — ABNORMAL LOW (ref 4.22–5.81)
RDW: 17.2 % — ABNORMAL HIGH (ref 11.5–15.5)
WBC: 12.3 K/uL — ABNORMAL HIGH (ref 4.0–10.5)
nRBC: 0.3 % — ABNORMAL HIGH (ref 0.0–0.2)

## 2019-07-08 LAB — C-REACTIVE PROTEIN: CRP: 4.2 mg/dL — ABNORMAL HIGH (ref <1.0)

## 2019-07-08 LAB — PROTIME-INR
INR: 1.4 — ABNORMAL HIGH (ref 0.8–1.2)
Prothrombin Time: 16.7 s — ABNORMAL HIGH (ref 11.4–15.2)

## 2019-07-08 LAB — MAGNESIUM: Magnesium: 1.9 mg/dL (ref 1.7–2.4)

## 2019-07-08 LAB — PROCALCITONIN: Procalcitonin: <0.1 ng/mL

## 2019-07-08 LAB — D-DIMER, QUANTITATIVE: D-Dimer, Quant: 3.83 ug/mL-FEU — ABNORMAL HIGH (ref 0.00–0.50)

## 2019-07-08 LAB — BRAIN NATRIURETIC PEPTIDE: B Natriuretic Peptide: 80 pg/mL (ref 0.0–100.0)

## 2019-07-08 LAB — TROPONIN I (HIGH SENSITIVITY): Troponin I (High Sensitivity): 70 ng/L — ABNORMAL HIGH (ref <18)

## 2019-07-08 LAB — FERRITIN: Ferritin: 225 ng/mL (ref 24–336)

## 2019-07-08 LAB — PHOSPHORUS: Phosphorus: 3.8 mg/dL (ref 2.5–4.6)

## 2019-07-08 MED ORDER — LORAZEPAM 0.5 MG PO TABS
0.5000 mg | ORAL_TABLET | Freq: Two times a day (BID) | ORAL | Status: DC | PRN
  Administered 2019-07-09 (×2): 0.5 mg via ORAL
  Filled 2019-07-08 (×2): qty 1

## 2019-07-08 MED ORDER — FENTANYL CITRATE (PF) 100 MCG/2ML IJ SOLN
INTRAMUSCULAR | Status: AC
  Filled 2019-07-08: qty 2

## 2019-07-08 MED ORDER — FENTANYL CITRATE (PF) 100 MCG/2ML IJ SOLN
12.5000 ug | Freq: Once | INTRAMUSCULAR | Status: AC
  Administered 2019-07-08: 12.5 ug via INTRAVENOUS

## 2019-07-08 MED ORDER — QUETIAPINE FUMARATE 100 MG PO TABS
100.0000 mg | ORAL_TABLET | Freq: Every day | ORAL | Status: DC
  Administered 2019-07-08 – 2019-07-09 (×2): 100 mg via ORAL
  Filled 2019-07-08 (×2): qty 1

## 2019-07-08 MED ORDER — FENTANYL CITRATE (PF) 100 MCG/2ML IJ SOLN
12.5000 ug | Freq: Once | INTRAMUSCULAR | Status: AC
  Administered 2019-07-08: 23:00:00 12.5 ug via INTRAVENOUS

## 2019-07-08 MED ORDER — MIRTAZAPINE 15 MG PO TABS
15.0000 mg | ORAL_TABLET | Freq: Every day | ORAL | Status: DC
  Administered 2019-07-08 – 2019-07-09 (×2): 15 mg via ORAL
  Filled 2019-07-08 (×2): qty 1

## 2019-07-08 MED ORDER — LEVALBUTEROL TARTRATE 45 MCG/ACT IN AERO
2.0000 | INHALATION_SPRAY | Freq: Four times a day (QID) | RESPIRATORY_TRACT | Status: DC
  Filled 2019-07-08: qty 15

## 2019-07-08 MED ORDER — FENTANYL CITRATE (PF) 100 MCG/2ML IJ SOLN
12.5000 ug | INTRAMUSCULAR | Status: DC | PRN
  Administered 2019-07-10: 12.5 ug via INTRAVENOUS
  Filled 2019-07-08: qty 2

## 2019-07-08 MED ORDER — POTASSIUM CHLORIDE 2 MEQ/ML IV SOLN: INTRAVENOUS | Status: DC

## 2019-07-08 MED ORDER — KCL-LACTATED RINGERS 20 MEQ/L IV SOLN
INTRAVENOUS | Status: DC
  Filled 2019-07-08 (×5): qty 1000

## 2019-07-08 MED ORDER — IPRATROPIUM BROMIDE HFA 17 MCG/ACT IN AERS
2.0000 | INHALATION_SPRAY | Freq: Four times a day (QID) | RESPIRATORY_TRACT | Status: DC
  Filled 2019-07-08: qty 12.9

## 2019-07-08 NOTE — TOC Initial Note (Signed)
Transition of Care [facility]) - Initial/Assessment Note    Patient Details  Name: [name] MRN: [id number] Date of Birth: July 19, 1945  Transition of Care [facility]) CM/SW Contact:    Ihor Gully, LCSW Phone Number: 07/08/2019, 5:38 PM  Clinical Narrative:                 Patient admitted for COVID-19 virus infection. From [facility]. Patient has been a resident at the facility for one week. 4L oxygen at baseline. PTA to facility he ambulated independently. Granddaughter, [name], reports that she has considered patient moving to another facility. Discussed patient being LTC and Covid+. Ms Delman Cheadle indicated that she understood transition to a new facility may be difficult at this time.   Expected Discharge Plan: Skilled Nursing Facility Barriers to Discharge: Continued Medical Work up   Patient Goals and CMS Choice Patient states their goals for this hospitalization and ongoing recovery are:: LTC      Expected Discharge Plan and Services Expected Discharge Plan: [place]                                              Prior Living Arrangements/Services   Lives with:: Facility Resident Patient language and need for interpreter reviewed:: Yes        Need for Family Participation in Patient Care: Yes (Comment) Care giver support system in place?: Yes (comment) Current home services: DME Criminal Activity/Legal Involvement Pertinent to Current Situation/Hospitalization: No - Comment as needed  Activities of Daily Living Home Assistive Devices/Equipment: Walker (specify type) ADL Screening (condition at time of admission) Patient's cognitive ability adequate to safely complete daily activities?: Yes Is the patient deaf or have difficulty hearing?: No Does the patient have difficulty seeing, even when wearing glasses/contacts?: No Does the patient have difficulty concentrating, remembering, or making decisions?: Yes Patient able to express need  for assistance with ADLs?: Yes Does the patient have difficulty dressing or bathing?: Yes Independently performs ADLs?: No Communication: Independent Dressing (OT): Needs assistance Is this a change from baseline?: Pre-admission baseline Grooming: Needs assistance Is this a change from baseline?: Pre-admission baseline Feeding: Needs assistance Is this a change from baseline?: Pre-admission baseline Bathing: Needs assistance Is this a change from baseline?: Pre-admission baseline Toileting: Needs assistance Is this a change from baseline?: Pre-admission baseline In/Out Bed: Needs assistance Is this a change from baseline?: Pre-admission baseline Walks in Home: Dependent Is this a change from baseline?: Pre-admission baseline Does the patient have difficulty walking or climbing stairs?: Yes Weakness of Legs: Both Weakness of Arms/Hands: Both  Permission Sought/Granted Permission sought to share information with : Family Supports          Permission granted to share info w Relationship: [name], granddaughter     Emotional Assessment Appearance:: Appears younger than stated age   Affect (typically observed): Unable to Assess Orientation: : Oriented to Self Alcohol / Substance Use: Not Applicable Psych Involvement: No (comment)  Admission diagnosis:  AKI (acute kidney injury) ([place]) [N17.9] Acute urinary retention [R33.8] Hypotension, unspecified hypotension type [I95.9] Acute renal failure, unspecified acute renal failure type ([place]) [N17.9] COVID-19 virus infection [U07.1] Patient Active Problem List   Diagnosis Date Noted  . AKI (acute kidney injury) ([place]) 06/12/2019  . COVID-19 virus infection 07/07/2019  . Status post total replacement of right hip 06/25/2018  . Pain in right hip  06/19/2018  . Pre-op evaluation 06/07/2018  . Primary osteoarthritis of left hip 02/19/2018  . Avascular necrosis of bone of right hip (HCC) 02/19/2018  . Status post right hip  replacement 02/19/2018  . Unilateral primary osteoarthritis, left hip 01/09/2018  . Atherosclerosis of native arteries of the extremities with intermittent claudication 03/30/2014  . Aftercare following surgery of the circulatory system, NEC 03/30/2014  . Muscle cramps- Right  Flank / Sharyne Peach 03/30/2014  . Paresthesia of right foot 03/30/2014  . Chest pain 12/30/2013  . Unstable angina (HCC) 12/29/2013  . Chest pain at rest 12/29/2013  . Chronic respiratory failure (HCC) 12/29/2013  . COPD (chronic obstructive pulmonary disease) (HCC)   . HTN (hypertension), malignant 10/07/2013  . Dyspnea 03/06/2013  . Tobacco abuse   . HLD (hyperlipidemia)   . Peripheral vascular disease, unspecified (HCC) 03/26/2012  . Coronary atherosclerosis of native coronary artery 07/28/2011  . Mixed hyperlipidemia 07/28/2011  . Essential hypertension, benign 07/28/2011  . Peripheral arterial disease (HCC) 07/28/2011  . Carotid artery disease (HCC) 07/28/2011   PCP:  Kirstie Peri, MD Pharmacy:   [facility] - [place], [place] - [address] [place] [address] Phone: [phone] Fax: [phone]     Social Determinants of Health (SDOH) Interventions    Readmission Risk Interventions No flowsheet data found.

## 2019-07-08 NOTE — Progress Notes (Signed)
PROGRESS NOTE    [name]  JXB:[id number] DOB: 1945-10-29 DOA: 06/26/2019 PCP: [name], Ashish, MD     Brief Narrative:  73 y.o. male with medical history significant of asthma, carotid artery disease, COPD on home oxygen typically 4 LPM, CAD, essential hypertension, hyperlipidemia, CAD, history of NSTEMI, peripheral arterial disease, tobacco use [facility] to hypoxia of 71% and hypotension with BP of 71/54 mmHg.  Per EMS, he was 100% on his usual 4 LPM.  His systolic blood pressures 93 mmHg.  He has history of exposure to Covid.  He is unable to provide further history at this time.  ED Course: Initial vital signs were temperature 97.6 F, pulse 99, respiration 21, blood pressure 124/62 mmHg and O2 sat 100% on 4 LPM via nasal cannula.  The patient received Solu-Medrol and 1000 mL of NS bolus.  Urinalysis shows small hemoglobinuria and rare bacteria on microscopic examination.  His white count is 13.6 g/dL, hemoglobin 13.011.5 g% and platelets 144. Fibrinogen was 1774 and D-dimer 7.26. BNP was 66 pg/mL.  Troponin was 89 and then 61 ng/L.  Lactic acid was 2.4 and 1.5 mmol/L. LDH 309, triglycerides 144, procalcitonin 0.73, ferritin 136 and CRP 16.3.Chest radiograph shows bibasilar atelectasis.  Assessment & Plan: 1-COVID-19 virus infection -Continue oxygen supplementation -Continue as needed bronchodilators -Continue steroids and remdesivir (Day 3) -For now will continue empirical management with IV antibiotics -Continue to follow inflammatory markers closely. -vit C and Zinc ordered.  -WBC's trending down -CRP down to 4.2; 16.3 on admission.  2-Coronary atherosclerosis of native coronary artery -No chest pain -Telemetry so far demonstrated no acute ischemic changes; will discontinue cardicac monitoring -Continue statins and aspirin.  3-Mixed hyperlipidemia -CK level WNL -Continue statins   4-Essential hypertension, benign -BP stable -continue holding  diuretics and ACE inhibitor agent in the setting of AKI -gentle IVF's intermittently to be provided.  5-chronic resp failure due to COPD (chronic obstructive pulmonary disease) (HCC) -currently no wheezing -no nebulization in the setting of positive COVID -continue steroids and chronic O2 supplementation. -continue PRN bronchodilators.   6-AKI (acute kidney injury) on CKD stage 3b (HCC) -follow renal function trend -no signs of Acute UTI on UA -continue holding nephrotoxic agents -continue intermittent IVF's  -Cr down to 1.41  7-hypernatremia -in the setting of dehydration and poor oral intake -continue holding diuretics  -follow electrolytes trend   8-mood disorder/bipolar disease -Resume Remeron, Seroquel and as needed lorazepam. -No suicidal ideation or hallucinations currently. -Anxious and restless.   DVT prophylaxis: lovenox Code Status: DNR Family Communication:  Disposition Plan: Continue IV steroids, remdesivir, close monitoring of electrolytes and renal function and continue current IV antibiotics.  Follow inflammatory markers.  Consultants:   None  Procedures:   See below for x-ray reports.  Antimicrobials:  Anti-infectives (From admission, onward)   Start     Dose/Rate Route Frequency Ordered Stop   07/07/19 1800  ceFEPIme (MAXIPIME) 2 g in sodium chloride 0.9 % 100 mL IVPB     2 g 200 mL/hr over 30 Minutes Intravenous Every 12 hours 07/07/19 0905     07/06/19 1000  remdesivir 100 mg in sodium chloride 0.9 % 100 mL IVPB     100 mg 200 mL/hr over 30 Minutes Intravenous Daily 07/09/2019 2257 January 10, 2019 0959   07/06/19 0400  ceFEPIme (MAXIPIME) 1 g in sodium chloride 0.9 % 100 mL IVPB  Status:  Discontinued     1 g 200 mL/hr over 30 Minutes Intravenous  Daily 07/06/19 0328 07/07/19 0905   07/06/19 0345  azithromycin (ZITHROMAX) 500 mg in sodium chloride 0.9 % 250 mL IVPB     500 mg 250 mL/hr over 60 Minutes Intravenous Daily 07/06/19 0328 07/11/19 0559    07-22-2019 2330  remdesivir 200 mg in sodium chloride 0.9% 250 mL IVPB     200 mg 580 mL/hr over 30 Minutes Intravenous Once Jul 22, 2019 2257 07/06/19 0049       Subjective: Reported increased shortness of breath overnight; no chest pain, no nausea, no vomiting.  Currently with good O2 sat on 4 L nasal cannula supplementation.  More interactive and oriented x2.  Still complaining of productive cough.  Objective: Vitals:   07/08/19 0307 07/08/19 0314 07/08/19 0357 07/08/19 1229  BP:   (!) 151/82 (!) 160/89  Pulse:   99 96  Resp:   (!) 22 (!) 24  Temp:   97.7 F (36.5 C) 97.8 F (36.6 C)  TempSrc:   Oral Oral  SpO2: 92% 95% 95% 90%  Weight:      Height:        Intake/Output Summary (Last 24 hours) at 07/08/2019 1739 Last data filed at 07/08/2019 1710 Gross per 24 hour  Intake 100 ml  Output 1700 ml  Net -1600 ml   Filed Weights   07/22/2019 1818 07/06/19 1506  Weight: 83 kg 84.9 kg    Examination: General exam: Alert, awake, oriented x 2; overnight complaining of shortness of breath requiring increasing oxygen supplementation.  This morning feeling better and he was having intermittent episode of productive cough.  No hemoptysis appreciated today.  No fever, no chest pain, no nausea, no vomiting.  Reports improvement in his appetite.  More interactive and able to follow commands. Respiratory system: Positive rhonchi right, decreased breath sounds at the bases, no wheezing, no using accessory muscles.  4 L nasal cannula supplementation in place. Cardiovascular system: RRR. No murmurs, rubs, gallops. Gastrointestinal system: Abdomen is nondistended, soft and nontender. No organomegaly or masses felt. Normal bowel sounds heard. Central nervous system: No focal neurological deficits. Extremities: No cyanosis or clubbing Skin: No rashes, no petechiae Psychiatry: Anxious and restless;   Data Reviewed: I have personally reviewed following labs and imaging studies  CBC: Recent Labs    Lab 2019-07-22 1926 07/06/19 0548  WBC 13.6* 10.5  NEUTROABS 11.7* 9.2*  HGB 11.5* 11.9*  HCT 37.2* 39.8  MCV 95.1 97.1  PLT 144* 122*   Basic Metabolic Panel: Recent Labs  Lab 07-22-2019 1926 07/06/19 0548 07/07/19 0631 07/08/19 0528  NA 143 146* 145 146*  K 4.6 4.3 3.8 4.2  CL 109 114* 112* 113*  CO2 22 18* 19* 19*  GLUCOSE 142* 188* 166* 148*  BUN 69* 58* 54* 59*  CREATININE 4.12* 2.94* 1.61* 1.41*  CALCIUM 8.1* 7.9* 8.2* 8.1*  MG  --  1.9 1.9 1.9  PHOS  --  6.3* 3.9 3.8   GFR: Estimated Creatinine Clearance: 46.1 mL/min (A) (by C-G formula based on SCr of 1.41 mg/dL (H)).   Liver Function Tests: Recent Labs  Lab 07/06/19 0342 07/06/19 0548 07/07/19 0631 07/08/19 0528  AST 44* 48* 45* 56*  ALT 37 40 38 48*  ALKPHOS 30* 35* 29* 30*  BILITOT 0.6 0.3 0.5 0.6  PROT 5.9* 6.8 5.8* 6.3*  ALBUMIN 2.5* 2.9* 2.5* 2.7*   Cardiac Enzymes: Recent Labs  Lab 07/06/19 0342  CKTOTAL 188   Lipid Profile: Recent Labs    Jul 22, 2019 1926  TRIG 144  Anemia Panel: Recent Labs    07/07/19 0631 07/08/19 0528  FERRITIN 178 225   Urine analysis:    Component Value Date/Time   COLORURINE YELLOW 06/28/2019 2204   APPEARANCEUR HAZY (A) 06/22/2019 2204   LABSPEC 1.015 06/22/2019 2204   PHURINE 5.0 06/25/2019 2204   GLUCOSEU NEGATIVE 06/29/2019 2204   HGBUR SMALL (A) 06/20/2019 2204   BILIRUBINUR NEGATIVE 06/26/2019 2204   KETONESUR NEGATIVE 06/24/2019 2204   PROTEINUR NEGATIVE 07/03/2019 2204   UROBILINOGEN 0.2 09/14/2010 0706   NITRITE NEGATIVE 06/11/2019 2204   LEUKOCYTESUR NEGATIVE 06/13/2019 2204    Recent Results (from the past 240 hour(s))  Blood Culture (routine x 2)     Status: None (Preliminary result)   Collection Time: 07/09/2019  7:55 PM   Specimen: Left Antecubital; Blood  Result Value Ref Range Status   Specimen Description LEFT ANTECUBITAL  Final   Special Requests   Final    BOTTLES DRAWN AEROBIC AND ANAEROBIC Blood Culture adequate volume    Culture   Final    NO GROWTH 3 DAYS Performed at [facility], [address], [place], [place] [address]    Report Status PENDING  Incomplete  Urine culture     Status: None   Collection Time: 06/14/2019 10:04 PM   Specimen: Urine, Clean Catch  Result Value Ref Range Status   Specimen Description   Final    URINE, CLEAN CATCH Performed at [facility], [address] [place], [place] [address]    Special Requests   Final    NONE Performed at [facility], [address], [place], [place] [address]    Culture   Final    NO GROWTH Performed at [facility], [address], [place], [place] [address]    Report Status 07/07/2019 FINAL  Final  Blood Culture (routine x 2)     Status: None (Preliminary result)   Collection Time: 06/27/2019 11:09 PM   Specimen: BLOOD RIGHT ARM  Result Value Ref Range Status   Specimen Description BLOOD RIGHT ARM  Final   Special Requests   Final    BOTTLES DRAWN AEROBIC AND ANAEROBIC Blood Culture adequate volume   Culture   Final    NO GROWTH 3 DAYS Performed at [facility], [address] [place], [place] [address]    Report Status PENDING  Incomplete  Culture, sputum-assessment     Status: None   Collection Time: 07/06/19  8:15 PM   Specimen: Expectorated Sputum  Result Value Ref Range Status   Specimen Description EXPECTORATED SPUTUM  Final   Special Requests Immunocompromised  Final   Sputum evaluation   Final    THIS SPECIMEN IS ACCEPTABLE FOR SPUTUM CULTURE Performed at [facility], [address] [place], [place] [address]    Report Status 07/06/2019 FINAL  Final  Culture, respiratory     Status: None (Preliminary result)   Collection Time: 07/06/19  8:15 PM  Result Value Ref Range Status   Specimen Description   Final    EXPECTORATED SPUTUM Performed at [facility], [address], [place], [place] [address]    Special Requests   Final    Immunocompromised Reflexed from [id number] Performed at [facility], [address],  [place], [place] [address]    Gram Stain   Final    RARE WBC PRESENT, PREDOMINANTLY PMN RARE GRAM POSITIVE COCCI IN PAIRS    Culture   Final    FEW STAPHYLOCOCCUS AUREUS SUSCEPTIBILITIES TO FOLLOW Performed at  [facility], [place] [address] [place], [place] [address]    Report Status PENDING  Incomplete  MRSA PCR Screening     Status: Abnormal   Collection Time: 07/06/19 11:30 PM   Specimen: Nasopharyngeal  Result Value Ref Range Status   MRSA by PCR POSITIVE (A) NEGATIVE Final    Comment:        The GeneXpert MRSA Assay (FDA approved for NASAL specimens only), is one component of a comprehensive MRSA colonization surveillance program. It is not intended to diagnose MRSA infection nor to guide or monitor treatment for MRSA infections. RESULT CALLED TO, READ BACK BY AND VERIFIED WITH: BONDURANT,R. AT 0122 ON 07/07/2019 BY EVA Performed at [facility], [address], [place], [place] [address]      Radiology Studies: No results found.  Scheduled Meds: . vitamin C  500 mg Oral Daily  . aspirin EC  81 mg Oral Daily  . Chlorhexidine Gluconate Cloth  6 each Topical Daily  . enoxaparin (LOVENOX) injection  40 mg Subcutaneous Q24H  . Ipratropium-Albuterol  2 puff Inhalation Q6H  . methylPREDNISolone (SOLU-MEDROL) injection  40 mg Intravenous Q12H  . mupirocin ointment  1 application Nasal BID  . simvastatin  20 mg Oral q1800  . zinc sulfate  220 mg Oral Daily   Continuous Infusions: . azithromycin 500 mg (07/08/19 0512)  . ceFEPime (MAXIPIME) IV 2 g (07/08/19 1659)  . remdesivir 100 mg in NS 100 mL 100 mg (07/08/19 0907)     LOS: 3 days    Time spent: 35 minutes.    Barton Dubois, MD [facility] Pager [phone]   07/08/2019, 5:39 PM

## 2019-07-08 NOTE — Progress Notes (Signed)
CRITICAL VALUE ALERT  Critical Value:  Lactic acid 2.6  Date & Time Notied:  07/08/2019 2335  Provider Notified: Olevia Bowens   Orders Received/Actions taken: No new orders at this time.

## 2019-07-08 NOTE — Progress Notes (Signed)
Patient with h/o COPD chronically on 4LNC baseline, 02 was 91% to 92% on 4L with c/o SOB at rest. I increased his 02 to [facility] and 02 sustained 95%, pt SOB at rest resolved. MD Olevia Bowens was made aware of the increase of oxygen and [place] 6L is not far from patients baseline and he was not struggling to breathe stated the patient was appropriate to stay on unit. All vitals WNL at this time. Will continue to monitor.

## 2019-07-08 NOTE — Progress Notes (Signed)
TR[facility] night shift coverage note.  The patient was seen due to an episode of transient hypotension 68/42 mmHg while he was coughing.  He was also complaining of pleuritic chest pain on left lower chest wall.  He was mildly tachypneic in the respiratory rate in low to mid 20s.  No JVD.  Lungs have bilateral rhonchi with subtle wheezing.  He was tachycardic in the 120s.  Abdomen was soft.  Extremities no edema, clubbing or cyanosis.  His EKG did not have any changes.  He has responded to IVF boluses and bronchodilators.  His lactic acid has been elevated, but the second measurement was taken before he received the fluid bolus.   Component Value Units  Troponin I (High Sensitivity) [[id number]] (Abnormal)   Collected: 07/09/19 0059   Updated: 07/09/19 0151    Troponin I (High Sensitivity) 74High  ng/L  Lactic acid, plasma [[id number]] (Abnormal)   Collected: 07/09/19 0059   Updated: 07/09/19 0151   Specimen Type: Blood    Lactic Acid, Venous 2.8High Panic   mmol/L  C-reactive protein [[id number]] (Abnormal)   Collected: 07/08/19 2250   Updated: 07/09/19 0044   Specimen Type: Blood    CRP 2.3High  mg/dL  Ferritin [[id number]]   Collected: 07/08/19 2250   Updated: 07/09/19 0044   Specimen Type: Blood    Ferritin 203 ng/mL  Magnesium [[id number]]   Collected: 07/08/19 2250   Updated: 07/09/19 0003   Specimen Type: Blood    Magnesium 1.9 mg/dL  Procalcitonin - Baseline [[id number]]   Collected: 07/08/19 2250   Updated: 07/08/19 2343    Procalcitonin <0.10 ng/mL  Lactic acid, plasma [[id number]] (Abnormal)   Collected: 07/08/19 2250   Updated: 07/08/19 2337   Specimen Type: Blood    Lactic Acid, Venous 2.6High Panic   mmol/L  Protime-INR [[id number]] (Abnormal)   Collected: 07/08/19 2250   Updated: 07/08/19 2328   Specimen Type: Blood    Prothrombin Time 16.7High  seconds   INR 1.4High   Brain natriuretic peptide [[id number]]   Collected: 07/08/19 2250   Updated: 07/08/19 2322   Specimen  Type: Blood    B Natriuretic Peptide 80.0 pg/mL  Troponin I (High Sensitivity) [[id number]] (Abnormal)   Collected: 07/08/19 2220   Updated: 07/08/19 2319    Troponin I (High Sensitivity) 70High  ng/L  Comprehensive metabolic panel [[id number]] (Abnormal)   Collected: 07/08/19 2220   Updated: 07/08/19 2319   Specimen Type: Blood    Sodium 142 mmol/L   Potassium 3.7 mmol/L   Chloride 112High  mmol/L   CO2 17Low  mmol/L   Glucose, Bld 173High  mg/dL   BUN 93TDSK  mg/dL   Creatinine, Ser 1.39High  mg/dL   Calcium 8.7GOT  mg/dL   Total Protein 1.5BWI  g/dL   Albumin 2.0BTD  g/dL   AST 97CBUL  U/L   ALT 49High  U/L   Alkaline Phosphatase 27Low  U/L   Total Bilirubin 0.9 mg/dL   GFR calc non Af Amer 50Low  mL/min   GFR calc Af Amer 57Low  mL/min   Anion gap 13  CBC [[id number]] (Abnormal)   Collected: 07/08/19 2220   Updated: 07/08/19 2251   Specimen Type: Blood    WBC 12.3High  K/uL   RBC 3.26Low  MIL/uL   Hemoglobin 9.5Low  g/dL   HCT 32.1YYQ  %   MCV 93.3 fL   MCH 29.1 pg   MCHC 31.3 g/dL   RDW 82.5OIBB  %   Platelets 147Low  K/uL  nRBC 0.3High  %    His chest radiograph did not show any interval changes.  Please see image and full cellular report for further detail.  Assessment: Hypotension Likely due to vigorous coughing. Transient and improved with IVF.  Pleuritic chest pain Much better after he was given acetaminophen and low-dose fentanyl.  Lactic acidosis Recheck level after IV fluid bolus.  Tennis Must, MD  About 50 minutes of critical care time were spent during the process of these for urgent events.  This document was prepared using Dragon voice recognition software and may contain some unintended transcription errors.

## 2019-07-09 ENCOUNTER — Inpatient Hospital Stay (HOSPITAL_COMMUNITY): Payer: Medicare Other

## 2019-07-09 ENCOUNTER — Encounter (HOSPITAL_COMMUNITY): Payer: Self-pay | Admitting: Internal Medicine

## 2019-07-09 DIAGNOSIS — N1831 Chronic kidney disease, stage 3a: Secondary | ICD-10-CM

## 2019-07-09 DIAGNOSIS — I10 Essential (primary) hypertension: Secondary | ICD-10-CM

## 2019-07-09 DIAGNOSIS — R071 Chest pain on breathing: Secondary | ICD-10-CM

## 2019-07-09 DIAGNOSIS — J9621 Acute and chronic respiratory failure with hypoxia: Secondary | ICD-10-CM

## 2019-07-09 LAB — CULTURE, RESPIRATORY W GRAM STAIN

## 2019-07-09 LAB — CBC
HCT: 32.4 % — ABNORMAL LOW (ref 39.0–52.0)
Hemoglobin: 9.9 g/dL — ABNORMAL LOW (ref 13.0–17.0)
MCH: 29.2 pg (ref 26.0–34.0)
MCHC: 30.6 g/dL (ref 30.0–36.0)
MCV: 95.6 fL (ref 80.0–100.0)
Platelets: 155 10*3/uL (ref 150–400)
RBC: 3.39 MIL/uL — ABNORMAL LOW (ref 4.22–5.81)
RDW: 17.2 % — ABNORMAL HIGH (ref 11.5–15.5)
WBC: 12.6 10*3/uL — ABNORMAL HIGH (ref 4.0–10.5)
nRBC: 0.3 % — ABNORMAL HIGH (ref 0.0–0.2)

## 2019-07-09 LAB — BRAIN NATRIURETIC PEPTIDE: B Natriuretic Peptide: 71 pg/mL (ref 0.0–100.0)

## 2019-07-09 LAB — COMPREHENSIVE METABOLIC PANEL
ALT: 51 U/L — ABNORMAL HIGH (ref 0–44)
AST: 55 U/L — ABNORMAL HIGH (ref 15–41)
Albumin: 2.2 g/dL — ABNORMAL LOW (ref 3.5–5.0)
Alkaline Phosphatase: 23 U/L — ABNORMAL LOW (ref 38–126)
Anion gap: 11 (ref 5–15)
BUN: 71 mg/dL — ABNORMAL HIGH (ref 8–23)
CO2: 18 mmol/L — ABNORMAL LOW (ref 22–32)
Calcium: 7.7 mg/dL — ABNORMAL LOW (ref 8.9–10.3)
Chloride: 117 mmol/L — ABNORMAL HIGH (ref 98–111)
Creatinine, Ser: 1.33 mg/dL — ABNORMAL HIGH (ref 0.61–1.24)
GFR calc Af Amer: >60 mL/min (ref >60)
GFR calc non Af Amer: 52 mL/min — ABNORMAL LOW (ref >60)
Glucose, Bld: 173 mg/dL — ABNORMAL HIGH (ref 70–99)
Potassium: 4.2 mmol/L (ref 3.5–5.1)
Sodium: 146 mmol/L — ABNORMAL HIGH (ref 135–145)
Total Bilirubin: 0.6 mg/dL (ref 0.3–1.2)
Total Protein: 5.1 g/dL — ABNORMAL LOW (ref 6.5–8.1)

## 2019-07-09 LAB — TROPONIN I (HIGH SENSITIVITY)
Troponin I (High Sensitivity): 74 ng/L — ABNORMAL HIGH (ref <18)
Troponin I (High Sensitivity): 67 ng/L — ABNORMAL HIGH (ref <18)
Troponin I (High Sensitivity): 66 ng/L — ABNORMAL HIGH (ref <18)

## 2019-07-09 LAB — PROCALCITONIN: Procalcitonin: 0.1 ng/mL

## 2019-07-09 LAB — LEGIONELLA PNEUMOPHILA SEROGP 1 UR AG: L. pneumophila Serogp 1 Ur Ag: NEGATIVE

## 2019-07-09 LAB — LACTIC ACID, PLASMA
Lactic Acid, Venous: 2.8 mmol/L (ref 0.5–1.9)
Lactic Acid, Venous: 2.4 mmol/L (ref 0.5–1.9)

## 2019-07-09 LAB — OCCULT BLOOD X 1 CARD TO LAB, STOOL: Fecal Occult Bld: POSITIVE — AB

## 2019-07-09 LAB — C-REACTIVE PROTEIN
CRP: 2.3 mg/dL — ABNORMAL HIGH (ref <1.0)
CRP: 1.6 mg/dL — ABNORMAL HIGH (ref <1.0)

## 2019-07-09 LAB — D-DIMER, QUANTITATIVE: D-Dimer, Quant: 1.83 ug/mL-FEU — ABNORMAL HIGH (ref 0.00–0.50)

## 2019-07-09 LAB — FERRITIN: Ferritin: 203 ng/mL (ref 24–336)

## 2019-07-09 LAB — PHOSPHORUS: Phosphorus: 3.1 mg/dL (ref 2.5–4.6)

## 2019-07-09 LAB — MAGNESIUM: Magnesium: 1.9 mg/dL (ref 1.7–2.4)

## 2019-07-09 MED ORDER — VITAMIN K1 10 MG/ML IJ SOLN
10.0000 mg | Freq: Once | INTRAMUSCULAR | Status: AC
  Administered 2019-07-09: 10 mg via SUBCUTANEOUS
  Filled 2019-07-09: qty 1

## 2019-07-09 MED ORDER — IPRATROPIUM-ALBUTEROL 20-100 MCG/ACT IN AERS
1.0000 | INHALATION_SPRAY | Freq: Four times a day (QID) | RESPIRATORY_TRACT | Status: DC
  Administered 2019-07-09 – 2019-07-10 (×4): 1 via RESPIRATORY_TRACT
  Filled 2019-07-09: qty 4

## 2019-07-09 MED ORDER — VANCOMYCIN HCL 1500 MG/300ML IV SOLN
1500.0000 mg | INTRAVENOUS | Status: DC
  Filled 2019-07-09 (×2): qty 300

## 2019-07-09 MED ORDER — POTASSIUM CHLORIDE IN NACL 20-0.9 MEQ/L-% IV SOLN: INTRAVENOUS | Status: AC

## 2019-07-09 MED ORDER — VANCOMYCIN HCL 1750 MG/350ML IV SOLN
1750.0000 mg | Freq: Once | INTRAVENOUS | Status: AC
  Administered 2019-07-09: 15:00:00 1750 mg via INTRAVENOUS
  Filled 2019-07-09: qty 350

## 2019-07-09 MED ORDER — IOHEXOL 350 MG/ML SOLN
100.0000 mL | Freq: Once | INTRAVENOUS | Status: AC | PRN
  Administered 2019-07-09: 13:00:00 75 mL via INTRAVENOUS

## 2019-07-09 MED ORDER — KCL-LACTATED RINGERS 20 MEQ/L IV SOLN
INTRAVENOUS | Status: AC
  Filled 2019-07-09: qty 1000

## 2019-07-09 NOTE — Progress Notes (Signed)
PROGRESS NOTE  [name] [id number] DOB: 01-13-1946 DOA: June 29, 2019 PCP: [name], Ashish, MD  Brief History:  73 y.o.malewith medical history significant ofasthma, carotid artery disease, COPD on home oxygen typically 4 LPM, CAD, essential hypertension, hyperlipidemia, CAD, history of NSTEMI, peripheral arterial disease, tobacco use who was [facility] due to hypoxia of 71% and hypotension with BP of 71/54 mmHg. Per EMS, he was 100% on his usual 4 LPM. His systolic blood pressures 93 mmHg. He has history of exposure to Covid. He is unable to provide further history at this time.  ED Course:Initial vital signsweretemperature 97.6 F, pulse 99, respiration 21, blood pressure 124/62 mmHg and O2 sat 100% on 4 LPM via nasal cannula. The patient received Solu-Medrol and 1000 mL of NS bolus.  Urinalysis shows small hemoglobinuria and rare bacteria on microscopic examination. His white count is 13.6 g/dL, hemoglobin 81.111.5 g% and platelets 144. Fibrinogen was 1774 and D-dimer 7.26. BNP was 66 pg/mL. Troponin was 89 and then 61 ng/L. Lactic acid was 2.4 and 1.5 mmol/L. LDH 309, triglycerides 144, procalcitonin 0.73, ferritin 136 and CRP 16.3.Chest radiograph showed increased interstitial markings and scattered bilateral opacities.   Assessment/Plan: Acute on Chronic respiratory Failure with Hypoxia due to COVID-19 pneumonia -normally [facility]L [place] [facility] -no on 6L -continue remdesivir and IV solumedrol -07/09/19--increased sob with increase oxygen demand -07/09/19--early am had rapid response call due to low BP -ABG -personally reviewed CXR--stable increased interstitial markings and bilateral opacities -CRP 16.3>>>1.6 -D-Dimer 7.26>>>1.83 -PCT 0.10 -CTA chest  Hematochezia -had 2 episodes hematochezia am 12/30 -GI consult -unfortunately, not a good candidate for endoscopy  Lactic Acidosis -I feel that this is more due to hypoxia at this  point than overwhelming sepsis  Coronary atherosclerosis of native coronary artery -having intermittent pleuritic chest pain -repeat EKG -cycle troponins 70>>>74 -Continue statins -holding ASA due to hematochezia  Mixed hyperlipidemia -CK level WNL -Continue statins   Essential hypertension, benign -BP stable -continue holding diuretics and ACE inhibitor agent in the setting of AKI  AKI (acute kidney injury) on CKD stage 3b (HCC) -baseline creatinine 1.0-1.3 -serum creatinine peaked 4.12 -continue intermittent IVF's  -Cr down to 1.41  hypernatremia -in the setting of dehydration  -hold HCTZ  mood disorder/bipolar disease -Resume Remeron, Seroquel and as needed lorazepam. -No suicidal ideation or hallucinations currently.   Goal of Care -had GOC discussion with POA -re-affirmed DNR -family does not want transfer to [facility] or [facility]     Disposition Plan:   Not stable for D/C Family Communication:   Granddaughter (POA) updated--[facility]  Consultants:    Code Status:  DNR  DVT Prophylaxis:   [place] Lovenox   Procedures: As Listed in Progress Note Above  Antibiotics: None   Total time spent 35 minutes.  Greater than 50% spent face to face counseling and coordinating care.    Subjective: Pt denies f/c, n/v.  Complains of sob, cough, cp.  Unable to provide details or further ROS.    Objective: Vitals:   07/09/19 0307 07/09/19 0331 07/09/19 0535 07/09/19 0743  BP: 137/75 130/72 129/69 123/69  Pulse: (!) 105 (!) 107 99 (!) 118  Resp: (!) 22 (!) 22 (!) 22 (!) 28  Temp:   (!) 97.3 F (36.3 C)   TempSrc:   Oral   SpO2: 95% 93% 95% 97%  Weight:      Height:  Intake/Output Summary (Last 24 hours) at 07/09/2019 1110 Last data filed at 07/09/2019 0900 Gross per 24 hour  Intake 340 ml  Output 1650 ml  Net -1310 ml   Weight change:  Exam:   General:  Pt is alert, follows commands appropriately, not in acute distress;  pleasantly confused  HEENT: No icterus, No thrush, No neck mass, [place]/AT  Cardiovascular: RRR, S1/S2, no rubs, no gallops  Respiratory: bilateral rhonchi, no wheeze  Abdomen: Soft/+BS, non tender, non distended, no guarding  Extremities: No edema, No lymphangitis, No petechiae, No rashes, no synovitis   Data Reviewed: I have personally reviewed following labs and imaging studies Basic Metabolic Panel: Recent Labs  Lab 07/06/19 0548 07/07/19 0631 07/08/19 0528 07/08/19 2220 07/08/19 2250 07/09/19 0714  NA 146* 145 146* 142  --  146*  K 4.3 3.8 4.2 3.7  --  4.2  CL 114* 112* 113* 112*  --  117*  CO2 18* 19* 19* 17*  --  18*  GLUCOSE 188* 166* 148* 173*  --  173*  BUN 58* 54* 59* 68*  --  71*  CREATININE 2.94* 1.61* 1.41* 1.39*  --  1.33*  CALCIUM 7.9* 8.2* 8.1* 7.9*  --  7.7*  MG 1.9 1.9 1.9  --  1.9  --   PHOS 6.3* 3.9 3.8  --   --  3.1   Liver Function Tests: Recent Labs  Lab 07/06/19 0548 07/07/19 0631 07/08/19 0528 07/08/19 2220 07/09/19 0714  AST 48* 45* 56* 51* 55*  ALT 40 38 48* 49* 51*  ALKPHOS 35* 29* 30* 27* 23*  BILITOT 0.3 0.5 0.6 0.9 0.6  PROT 6.8 5.8* 6.3* 5.8* 5.1*  ALBUMIN 2.9* 2.5* 2.7* 2.5* 2.2*   No results for input(s): LIPASE, AMYLASE in the last 168 hours. No results for input(s): AMMONIA in the last 168 hours. Coagulation Profile: Recent Labs  Lab 07/08/19 2250  INR 1.4*   CBC: Recent Labs  Lab 2019/07/11 1926 07/06/19 0548 07/08/19 2220 07/09/19 0912  WBC 13.6* 10.5 12.3* 12.6*  NEUTROABS 11.7* 9.2*  --   --   HGB 11.5* 11.9* 9.5* 9.9*  HCT 37.2* 39.8 30.4* 32.4*  MCV 95.1 97.1 93.3 95.6  PLT 144* 122* 147* 155   Cardiac Enzymes: Recent Labs  Lab 07/06/19 0342  CKTOTAL 188   BNP: Invalid input(s): POCBNP CBG: No results for input(s): GLUCAP in the last 168 hours. HbA1C: No results for input(s): HGBA1C in the last 72 hours. Urine analysis:    Component Value Date/Time   COLORURINE YELLOW 07-11-2019 2204    APPEARANCEUR HAZY (A) 11-Jul-2019 2204   LABSPEC 1.015 July 11, 2019 2204   PHURINE 5.0 July 11, 2019 2204   GLUCOSEU NEGATIVE 2019/07/11 2204   HGBUR SMALL (A) 07/11/19 2204   BILIRUBINUR NEGATIVE July 11, 2019 2204   KETONESUR NEGATIVE 07/11/19 2204   PROTEINUR NEGATIVE 07-11-19 2204   UROBILINOGEN 0.2 09/14/2010 0706   NITRITE NEGATIVE July 11, 2019 2204   LEUKOCYTESUR NEGATIVE 2019/07/11 2204   Sepsis Labs: @LABRCNTIP (procalcitonin:4,lacticidven:4) ) Recent Results (from the past 240 hour(s))  Blood Culture (routine x 2)     Status: None (Preliminary result)   Collection Time: 11-Jul-2019  7:55 PM   Specimen: Left Antecubital; Blood  Result Value Ref Range Status   Specimen Description LEFT ANTECUBITAL  Final   Special Requests   Final    BOTTLES DRAWN AEROBIC AND ANAEROBIC Blood Culture adequate volume   Culture   Final    NO GROWTH 4 DAYS Performed at [facility],  [address] [place], [place] [address]    Report Status PENDING  Incomplete  Urine culture     Status: None   Collection Time: 06/26/2019 10:04 PM   Specimen: Urine, Clean Catch  Result Value Ref Range Status   Specimen Description   Final    URINE, CLEAN CATCH Performed at [facility], [address], [place], [place] [address]    Special Requests   Final    NONE Performed at [facility], [address], [place], [place] [address]    Culture   Final    NO GROWTH Performed at [facility], [place] [address] [place], [place] [address]    Report Status 07/07/2019 FINAL  Final  Blood Culture (routine x 2)     Status: None (Preliminary result)   Collection Time: 07/09/2019 11:09 PM   Specimen: BLOOD RIGHT ARM  Result Value Ref Range Status   Specimen Description BLOOD RIGHT ARM  Final   Special Requests   Final    BOTTLES DRAWN AEROBIC AND ANAEROBIC Blood Culture adequate volume   Culture   Final    NO GROWTH 4 DAYS Performed at [facility], [address], [place], [place] [address]    Report Status  PENDING  Incomplete  Culture, sputum-assessment     Status: None   Collection Time: 07/06/19  8:15 PM   Specimen: Expectorated Sputum  Result Value Ref Range Status   Specimen Description EXPECTORATED SPUTUM  Final   Special Requests Immunocompromised  Final   Sputum evaluation   Final    THIS SPECIMEN IS ACCEPTABLE FOR SPUTUM CULTURE Performed at [facility], [address] [place], [place] [address]    Report Status 07/06/2019 FINAL  Final  Culture, respiratory     Status: None   Collection Time: 07/06/19  8:15 PM  Result Value Ref Range Status   Specimen Description   Final    EXPECTORATED SPUTUM Performed at [facility], [address], [place], [place] [address]    Special Requests   Final    Immunocompromised Reflexed from [id number] Performed at [facility], [address] [place], [place] [address]    Gram Stain   Final    RARE WBC PRESENT, PREDOMINANTLY PMN RARE GRAM POSITIVE COCCI IN PAIRS Performed at [facility], [place] [address] [place],  [address]    Culture FEW METHICILLIN RESISTANT STAPHYLOCOCCUS AUREUS  Final   Report Status 07/09/2019 FINAL  Final   Organism ID, Bacteria METHICILLIN RESISTANT STAPHYLOCOCCUS AUREUS  Final      Susceptibility   Methicillin resistant staphylococcus aureus - MIC*    CIPROFLOXACIN >=8 RESISTANT Resistant     ERYTHROMYCIN >=8 RESISTANT Resistant     GENTAMICIN <=0.5 SENSITIVE Sensitive     OXACILLIN >=4 RESISTANT Resistant     TETRACYCLINE <=1 SENSITIVE Sensitive     VANCOMYCIN <=0.5 SENSITIVE Sensitive     TRIMETH/SULFA <=10 SENSITIVE Sensitive     CLINDAMYCIN <=0.25 SENSITIVE Sensitive     RIFAMPIN <=0.5 SENSITIVE Sensitive     Inducible Clindamycin NEGATIVE Sensitive     * FEW METHICILLIN RESISTANT STAPHYLOCOCCUS AUREUS  MRSA PCR Screening     Status: Abnormal   Collection Time: 07/06/19 11:30 PM   Specimen: Nasopharyngeal  Result Value Ref Range Status   MRSA by PCR POSITIVE (A) NEGATIVE Final    Comment:         The GeneXpert MRSA Assay (FDA approved for NASAL specimens only), is one component of a  comprehensive MRSA colonization surveillance program. It is not intended to diagnose MRSA infection nor to guide or monitor treatment for MRSA infections. RESULT CALLED TO, READ BACK BY AND VERIFIED WITH: BONDURANT,R. AT 0122 ON 07/07/2019 BY EVA Performed at [facility], [address] [place], [place] [address]      Scheduled Meds:  vitamin C  500 mg Oral Daily   aspirin EC  81 mg Oral Daily   Chlorhexidine Gluconate Cloth  6 each Topical Daily   enoxaparin (LOVENOX) injection  40 mg Subcutaneous Q24H   Ipratropium-Albuterol  1 puff Inhalation Q6H   methylPREDNISolone (SOLU-MEDROL) injection  40 mg Intravenous Q12H   mirtazapine  15 mg Oral QHS   mupirocin ointment  1 application Nasal BID   QUEtiapine  100 mg Oral QHS   simvastatin  20 mg Oral q1800   zinc sulfate  220 mg Oral Daily   Continuous Infusions:  azithromycin 500 mg (07/09/19 0640)   ceFEPime (MAXIPIME) IV 2 g (07/09/19 0534)    Procedures/Studies: DG CHEST PORT 1 VIEW  Result Date: 07/09/2019 CLINICAL DATA:  Shortness of breath. EXAM: PORTABLE CHEST 1 VIEW COMPARISON:  July 08, 2019. FINDINGS: The heart size and mediastinal contours are within normal limits. No pneumothorax or pleural effusion is noted. Stable bibasilar subsegmental atelectasis or infiltrates are noted. Probable emphysematous disease is noted in both upper lobes. Stable right middle lobe opacity is noted concerning for possible infiltrate. The visualized skeletal structures are unremarkable. IMPRESSION: Stable bilateral lung opacities are noted concerning for multifocal pneumonia. Continued radiographic follow-up is recommended to ensure resolution. Aortic Atherosclerosis (ICD10-I70.0). Electronically Signed   By: Lupita Raider M.D.   On: 07/09/2019 09:32   DG CHEST PORT 1 VIEW  Result Date: 07/08/2019 CLINICAL DATA:  Hypoxia EXAM:  PORTABLE CHEST 1 VIEW COMPARISON:  07/28/19 FINDINGS: There is scattered airspace opacities bilaterally which are similar to prior study. There is no pneumothorax. No large pleural effusion. The heart size is stable from prior study. Aortic calcifications are noted. IMPRESSION: 1. No interval change in scattered airspace opacities bilaterally compatible with multifocal pneumonia. 2. No pneumothorax or large pleural effusion. Electronically Signed   By: Katherine Mantle M.D.   On: 07/08/2019 22:50   DG Chest Portable 1 View  Result Date: 2019-07-28 CLINICAL DATA:  74 year old with acute onset of shortness of breath. EXAM: PORTABLE CHEST 1 VIEW COMPARISON:  06/14/2019 and earlier. FINDINGS: Suboptimal inspiration accounts for crowded bronchovascular markings diffusely and atelectasis in the bases, and accentuates the cardiac silhouette. Taking this into account, cardiac silhouette upper normal in size to mildly enlarged for AP portable technique, unchanged. Mild pulmonary venous hypertension without overt edema. Lungs otherwise clear. No visible pleural effusions. IMPRESSION: 1. Suboptimal inspiration accounts for bibasilar atelectasis. No acute cardiopulmonary disease otherwise. 2. Stable mild cardiomegaly without pulmonary edema. Electronically Signed   By: Hulan Saas M.D.   On: 07-28-2019 18:55    Catarina Hartshorn, DO  [facility] Pager [phone]  If 7PM-7AM, please contact night-coverage [email or web address] Password [email or web address] 07/09/2019, 11:10 AM   LOS: 4 days

## 2019-07-09 NOTE — Progress Notes (Signed)
Has had 3 loose stools today and none since this morning.

## 2019-07-09 NOTE — Progress Notes (Signed)
Around 2200 07/08/2019 patient was found lethargic with complaints of left sided chest pain. STAT EKG  was order and vital signs taken, rapid response was called. Patient was hypotensive, tachycardic and O2 was in the high 80s on 4L. MD Olevia Bowens at bedside 564ml bolus was given, oxygen increased to 6L and 2 doses of IV Fentanyl were administered. Vitals signs improved. BP still on soft side and tachy, O2 mid to high 90s on 6L. CXR was completed as well as lab work obtained. 2 new inhalers, IV fentaNYL PRN, and LR with 20 KCL continuous added to [facility]. Lab called with critical lactic acid value 2.6, result relayed to MD Olevia Bowens. Cardiac monitor applied. Currently patient still with soft BP he also reports that pain is "a little better". Facial grimacing noted intermittently. Will continue to monitor.

## 2019-07-09 NOTE — Consult Note (Addendum)
GI Inpatient Consult Note  Reason for Consult: maroon stool    Attending Requesting Consult: Dr. Arbutus Leas  History of Present Illness: [name] is a 73 y.o. male seen for evaluation at the request of Dr. Arbutus Leas.  Given patient's history of dementia - not able to obtain history from patient, not orientated to place or time. Pt resides long term in nursing home [facility]). Due to positive Covid, history obtained from RN via phone.  RN noted yesterday having several maroon-colored stools, these have continued and has had 2 burgundy colored soft pasty consistency stools this morning.  No frank large volume BRB.  No vomiting.  Does not seem to have complaints of abdominal pain.  Has had very limited oral intake of mainly liquids over past 24 hrs, likely due to worsening shortness of breath and overall fatigue. No solid food intake for breakfast this AM.  Patient had rapid response overnight for hypotension, he is chronically on 4 L of oxygen at facility, increased to 6L overnight. Responded well to bolus.   Last Colonoscopy: per history has had sigmoid resection w/ rectopexy 2005 - unable to find further records in Epic   Last Endoscopy:    Past Medical History:  Past Medical History:  Diagnosis Date  . Asthma   . Carotid artery disease (HCC)    50-69% RICA and less than 50% LICA, 07/2011  . COPD (chronic obstructive pulmonary disease) (HCC)   . Coronary atherosclerosis of native coronary artery    Based on abnormal Myoview - inferior scar, 05/2011; LVEF 45-50%, echo, 07/2011  . Essential hypertension, benign   . HLD (hyperlipidemia)   . NSTEMI (non-ST elevated myocardial infarction) (HCC)    12/12, minor  . On home oxygen therapy    4Lnc with exertion  . Peripheral arterial disease (HCC)   . Tobacco abuse     Problem List: Patient Active Problem List   Diagnosis Date Noted  . AKI (acute kidney injury) (HCC) 07/21/19  . COVID-19 virus infection 07-21-19  . Status post  total replacement of right hip 06/25/2018  . Pain in right hip 06/19/2018  . Pre-op evaluation 06/07/2018  . Primary osteoarthritis of left hip 02/19/2018  . Avascular necrosis of bone of right hip (HCC) 02/19/2018  . Status post right hip replacement 02/19/2018  . Unilateral primary osteoarthritis, left hip 01/09/2018  . Atherosclerosis of native arteries of the extremities with intermittent claudication 03/30/2014  . Aftercare following surgery of the circulatory system, NEC 03/30/2014  . Muscle cramps- Right  Flank / Sharyne Peach 03/30/2014  . Paresthesia of right foot 03/30/2014  . Chest pain 12/30/2013  . Unstable angina (HCC) 12/29/2013  . Chest pain at rest 12/29/2013  . Chronic respiratory failure (HCC) 12/29/2013  . COPD (chronic obstructive pulmonary disease) (HCC)   . HTN (hypertension), malignant 10/07/2013  . Dyspnea 03/06/2013  . Tobacco abuse   . HLD (hyperlipidemia)   . Peripheral vascular disease, unspecified (HCC) 03/26/2012  . Coronary atherosclerosis of native coronary artery 07/28/2011  . Mixed hyperlipidemia 07/28/2011  . Essential hypertension, benign 07/28/2011  . Peripheral arterial disease (HCC) 07/28/2011  . Carotid artery disease (HCC) 07/28/2011    Past Surgical History: Past Surgical History:  Procedure Laterality Date  . ABDOMINAL SURGERY     2008  . COLONOSCOPY    . Right axillary to femoral bypass  3/12   Dr. Hart Rochester  . RIGHT HYDROCELE REPAIR    . Right to left femoral to femoral bypass  3/12  Dr. Hart RochesterLawson  . SIGMOID RESECTION / RECTOPEXY  2005  . skin abscess drainage    . TOTAL HIP ARTHROPLASTY Left 02/19/2018   Procedure: LEFT TOTAL HIP ARTHROPLASTY;  Surgeon: Valeria BatmanWhitfield, Peter W, MD;  Location: [facility] OR;  Service: Orthopedics;  Laterality: Left;  . TOTAL HIP ARTHROPLASTY Right 06/25/2018   Procedure: RIGHT TOTAL HIP ARTHROPLASTY;  Surgeon: Valeria BatmanWhitfield, Peter W, MD;  Location: [facility] OR;  Service: Orthopedics;  Laterality: Right;    Allergies: No Known  Allergies  Home Medications: Medications Prior to Admission  Medication Sig Dispense Refill Last Dose  . albuterol (PROAIR HFA) 108 (90 BASE) MCG/ACT inhaler Inhale 2 puffs into the lungs every 6 (six) hours as needed for shortness of breath.    Past Week at Unknown time  . aspirin EC 81 MG tablet Take 81 mg by mouth daily.   06/25/2019 at 0900  . lisinopril-hydrochlorothiazide (PRINZIDE,ZESTORETIC) 20-12.5 MG tablet Take 1 tablet by mouth daily.   06/15/2019 at Unknown time  . LORazepam (ATIVAN) 1 MG tablet Take 1 mg by mouth every 8 (eight) hours as needed for anxiety.   Past Week at Unknown time  . methylPREDNISolone (MEDROL) 4 MG tablet Take 4 mg by mouth at bedtime.   Past Week at Unknown time  . mirtazapine (REMERON) 15 MG tablet Take 15 mg by mouth at bedtime.   Past Week at Unknown time  . nitroGLYCERIN (NITROSTAT) 0.4 MG SL tablet Place 1 tablet (0.4 mg total) under the tongue every 5 (five) minutes x 3 doses as needed for chest pain. 25 tablet 3   . QUEtiapine (SEROQUEL) 100 MG tablet Take 100 mg by mouth at bedtime.   Past Week at Unknown time  . simvastatin (ZOCOR) 20 MG tablet TAKE 1 TABLET BY MOUTH EVERY DAY (Patient taking differently: Take 20 mg by mouth at bedtime. ) 10 tablet 0 Past Week at Unknown time  . aspirin EC 325 MG EC tablet Take 1 tablet (325 mg total) by mouth 2 (two) times daily at 8 am and 10 pm. (Patient not taking: Reported on 07/06/2019) 30 tablet 0 Not Taking at Unknown time  . HYDROcodone-acetaminophen (NORCO/VICODIN) 5-325 MG tablet Take 1-2 tablets by mouth every 4 (four) hours as needed for moderate pain (pain score 4-6). (Patient not taking: Reported on 07/06/2019) 30 tablet 0 Not Taking at Unknown time  . isosorbide mononitrate (IMDUR) 60 MG 24 hr tablet TAKE 1 TABLET BY MOUTH DAILY (NEEDS OFFICE VISIT) (Patient not taking: Reported on 07/06/2019) 1 tablet 0 Not Taking at Unknown time   Home medication reconciliation was completed with the patient.    Scheduled Inpatient Medications:   . vitamin C  500 mg Oral Daily  . aspirin EC  81 mg Oral Daily  . Chlorhexidine Gluconate Cloth  6 each Topical Daily  . enoxaparin (LOVENOX) injection  40 mg Subcutaneous Q24H  . fentaNYL      . Ipratropium-Albuterol  1 puff Inhalation Q6H  . methylPREDNISolone (SOLU-MEDROL) injection  40 mg Intravenous Q12H  . mirtazapine  15 mg Oral QHS  . mupirocin ointment  1 application Nasal BID  . QUEtiapine  100 mg Oral QHS  . simvastatin  20 mg Oral q1800  . zinc sulfate  220 mg Oral Daily    Continuous Inpatient Infusions:   . azithromycin 500 mg (07/09/19 0640)  . ceFEPime (MAXIPIME) IV 2 g (07/09/19 0534)    PRN Inpatient Medications:  acetaminophen **OR** acetaminophen, albuterol, chlorpheniramine-HYDROcodone, fentaNYL (SUBLIMAZE) injection, guaiFENesin-dextromethorphan, LORazepam, ondansetron **  OR** ondansetron (ZOFRAN) IV  Family History: family history includes Heart attack in his father and mother.    Social History:   reports that he quit smoking about 8 years ago. His smoking use included cigarettes. He started smoking about 59 years ago. He has a 110.00 pack-year smoking history. He has never used smokeless tobacco. He reports that he does not drink alcohol or use drugs.   Review of Systems: Positives in HPI   Physical Examination: BP 123/69   Pulse (!) 118   Temp (!) 97.3 F (36.3 C) (Oral)   Resp (!) 28   Ht 5\' 5"  (1.651 m)   Wt 84.9 kg   SpO2 97%   BMI 31.15 kg/m  Not completed-Dr. Laural Golden to complete if indicated    Data: Lab Results  Component Value Date   WBC 12.6 (H) 07/09/2019   HGB 9.9 (L) 07/09/2019   HCT 32.4 (L) 07/09/2019   MCV 95.6 07/09/2019   PLT 155 07/09/2019   Recent Labs  Lab 07/06/19 0548 07/08/19 2220 07/09/19 0912  HGB 11.9* 9.5* 9.9*   Lab Results  Component Value Date   NA 146 (H) 07/09/2019   K 4.2 07/09/2019   CL 117 (H) 07/09/2019   CO2 18 (L) 07/09/2019   BUN 71 (H)  07/09/2019   CREATININE 1.33 (H) 07/09/2019   Lab Results  Component Value Date   ALT 51 (H) 07/09/2019   AST 55 (H) 07/09/2019   ALKPHOS 23 (L) 07/09/2019   BILITOT 0.6 07/09/2019   Recent Labs  Lab 07/08/19 2250  INR 1.4*      Assessment/Plan: [name] is a 73 y.o. male admitted from nursing facility for hypoxia and hypotension d/t COVID-19. Over the past  24 hours has developed maroon-colored stools.  -Hemoccult pending, has been collected by RN. -Consider repeat CBC every 6 hours, currently hemoglobin up compared to yesterday. Hgb 11.5 on admission, Hgb 9.9 this AM. Normocytic. Normal platelets. INR 1.4 -Elevated BUN/Cr Ratio -ASA being held -Unable to obtain further history from patient -Current respiratory & cardiac clinical status make endoscopic evaluation high risk in setting of stable Hgb. Dr. Laural Golden to assess patient and provide further recommendations    Thank you for the consult. Please call with questions or concerns.  Laurine Blazer, PA-C [facility]

## 2019-07-09 NOTE — Progress Notes (Signed)
NS with 20 KCL now running. Patient is alert with 0/10 pain. Current vital signs BP 116/66, heart rate 104, RR 22, O2 97 on 6L, temp 97.6     Will continue to monitor.

## 2019-07-09 NOTE — Progress Notes (Signed)
Pharmacy Antibiotic Note  [name] is a 73 y.o. male admitted on 08/03/19 with MRSA in sputum.  Possible colonization.   Pharmacy has been consulted for Vancomycin dosing.  Plan: Vancomycin 1500 mg IV every 24 hours.  Goal trough 15-20 mcg/mL.  Monitor labs, c/s, and vanco level as indicated.  Height: 5\' 5"  (165.1 cm) Weight: 187 lb 2.7 oz (84.9 kg) IBW/kg (Calculated) : 61.5  Temp (24hrs), Avg:97.7 F (36.5 C), Min:97.3 F (36.3 C), Max:98.3 F (36.8 C)  Recent Labs  Lab 03-Aug-2019 1926 03-Aug-2019 2310 07/06/19 0548 07/07/19 0631 07/08/19 0528 07/08/19 2220 07/08/19 2250 07/09/19 0059 07/09/19 0714 07/09/19 0912  WBC 13.6*  --  10.5  --   --  12.3*  --   --   --  12.6*  CREATININE 4.12*  --  2.94* 1.61* 1.41* 1.39*  --   --  1.33*  --   LATICACIDVEN 2.4* 1.5  --   --   --   --  2.6* 2.8* 2.4*  --     Estimated Creatinine Clearance: 48.9 mL/min (A) (by C-G formula based on SCr of 1.33 mg/dL (H)).    No Known Allergies  Antimicrobials this admission: Vanco 12/30 >>  Remdesivir 12/27 Azith 12/27 >> Cefepime 12/27 >>  Microbiology results: 12/30 BCx: pending 12/26 BCx: ngtd 12/26 UCx: ng 12/27 Sputum: MRSA  12/27 MRSA PCR: positive  Thank you for allowing pharmacy to be a part of this patient's care.  Ramond Craver 07/09/2019 1:49 PM

## 2019-07-09 NOTE — Progress Notes (Signed)
CRITICAL VALUE ALERT  Critical Value:  Lactic acid 2.8  Date & Time Notied: 07/09/2019 0150  Provider Notified: MD Olevia Bowens  Orders Received/Actions taken: LR with 20 KCL unavailable will switch IVF to NS with 20 KCL and repeat lactic acid.

## 2019-07-09 NOTE — Progress Notes (Signed)
Breathing more labored than yesterday, resp 24 and O2 sat 94 on 4 L.  Had several burgandy colored stools today.  Gave prn albuterol with scheduled combivent and steroids.  Contacted Dr. Carles Collet and he ordered occult stool and  assessed patient.  GI on board.  CT angio ordered.  Dr. Carles Collet called family.  Family visiting now.

## 2019-07-10 DIAGNOSIS — R578 Other shock: Secondary | ICD-10-CM

## 2019-07-10 DIAGNOSIS — D62 Acute posthemorrhagic anemia: Secondary | ICD-10-CM

## 2019-07-10 LAB — TROPONIN I (HIGH SENSITIVITY): Troponin I (High Sensitivity): 86 ng/L — ABNORMAL HIGH (ref <18)

## 2019-07-10 LAB — COMPREHENSIVE METABOLIC PANEL
ALT: 48 U/L — ABNORMAL HIGH (ref 0–44)
ALT: 50 U/L — ABNORMAL HIGH (ref 0–44)
AST: 44 U/L — ABNORMAL HIGH (ref 15–41)
AST: 46 U/L — ABNORMAL HIGH (ref 15–41)
Albumin: 1.7 g/dL — ABNORMAL LOW (ref 3.5–5.0)
Albumin: 1.8 g/dL — ABNORMAL LOW (ref 3.5–5.0)
Alkaline Phosphatase: 25 U/L — ABNORMAL LOW (ref 38–126)
Alkaline Phosphatase: 25 U/L — ABNORMAL LOW (ref 38–126)
Anion gap: 19 — ABNORMAL HIGH (ref 5–15)
Anion gap: 20 — ABNORMAL HIGH (ref 5–15)
BUN: 85 mg/dL — ABNORMAL HIGH (ref 8–23)
BUN: 88 mg/dL — ABNORMAL HIGH (ref 8–23)
CO2: 6 mmol/L — ABNORMAL LOW (ref 22–32)
CO2: 8 mmol/L — ABNORMAL LOW (ref 22–32)
Calcium: 7 mg/dL — ABNORMAL LOW (ref 8.9–10.3)
Calcium: 7.1 mg/dL — ABNORMAL LOW (ref 8.9–10.3)
Chloride: 119 mmol/L — ABNORMAL HIGH (ref 98–111)
Chloride: 120 mmol/L — ABNORMAL HIGH (ref 98–111)
Creatinine, Ser: 2.03 mg/dL — ABNORMAL HIGH (ref 0.61–1.24)
Creatinine, Ser: 2.11 mg/dL — ABNORMAL HIGH (ref 0.61–1.24)
GFR calc Af Amer: 35 mL/min — ABNORMAL LOW (ref >60)
GFR calc Af Amer: 36 mL/min — ABNORMAL LOW (ref >60)
GFR calc non Af Amer: 30 mL/min — ABNORMAL LOW (ref >60)
GFR calc non Af Amer: 31 mL/min — ABNORMAL LOW (ref >60)
Glucose, Bld: 207 mg/dL — ABNORMAL HIGH (ref 70–99)
Glucose, Bld: 259 mg/dL — ABNORMAL HIGH (ref 70–99)
Potassium: 4.8 mmol/L (ref 3.5–5.1)
Potassium: 5.1 mmol/L (ref 3.5–5.1)
Sodium: 145 mmol/L (ref 135–145)
Sodium: 147 mmol/L — ABNORMAL HIGH (ref 135–145)
Total Bilirubin: 0.8 mg/dL (ref 0.3–1.2)
Total Bilirubin: 0.8 mg/dL (ref 0.3–1.2)
Total Protein: 3.9 g/dL — ABNORMAL LOW (ref 6.5–8.1)
Total Protein: 4.2 g/dL — ABNORMAL LOW (ref 6.5–8.1)

## 2019-07-10 LAB — CULTURE, BLOOD (ROUTINE X 2)
Culture: NO GROWTH
Culture: NO GROWTH
Special Requests: ADEQUATE
Special Requests: ADEQUATE

## 2019-07-10 LAB — CBC
HCT: 21.3 % — ABNORMAL LOW (ref 39.0–52.0)
Hemoglobin: 6.2 g/dL — CL (ref 13.0–17.0)
MCH: 30.4 pg (ref 26.0–34.0)
MCHC: 29.1 g/dL — ABNORMAL LOW (ref 30.0–36.0)
MCV: 104.4 fL — ABNORMAL HIGH (ref 80.0–100.0)
Platelets: 227 10*3/uL (ref 150–400)
RBC: 2.04 MIL/uL — ABNORMAL LOW (ref 4.22–5.81)
RDW: 17.4 % — ABNORMAL HIGH (ref 11.5–15.5)
WBC: 31.9 10*3/uL — ABNORMAL HIGH (ref 4.0–10.5)
nRBC: 3.6 % — ABNORMAL HIGH (ref 0.0–0.2)

## 2019-07-10 LAB — PHOSPHORUS: Phosphorus: 8.8 mg/dL — ABNORMAL HIGH (ref 2.5–4.6)

## 2019-07-10 LAB — BLOOD GAS, ARTERIAL
Acid-base deficit: 21.5 mmol/L — ABNORMAL HIGH (ref 0.0–2.0)
Bicarbonate: 8.1 mmol/L — ABNORMAL LOW (ref 20.0–28.0)
Drawn by: 22223
FIO2: 80
O2 Saturation: 84.5 %
Patient temperature: 37
pCO2 arterial: 30.3 mmHg — ABNORMAL LOW (ref 32.0–48.0)
pH, Arterial: 7.012 — CL (ref 7.350–7.450)
pO2, Arterial: 81.6 mmHg — ABNORMAL LOW (ref 83.0–108.0)

## 2019-07-10 LAB — GLUCOSE, CAPILLARY: Glucose-Capillary: 208 mg/dL — ABNORMAL HIGH (ref 70–99)

## 2019-07-10 LAB — PROTIME-INR
INR: 2.3 — ABNORMAL HIGH (ref 0.8–1.2)
Prothrombin Time: 24.9 seconds — ABNORMAL HIGH (ref 11.4–15.2)

## 2019-07-10 LAB — FERRITIN: Ferritin: 232 ng/mL (ref 24–336)

## 2019-07-10 LAB — D-DIMER, QUANTITATIVE: D-Dimer, Quant: 1.67 ug/mL-FEU — ABNORMAL HIGH (ref 0.00–0.50)

## 2019-07-10 LAB — LACTIC ACID, PLASMA: Lactic Acid, Venous: >11 mmol/L (ref 0.5–1.9)

## 2019-07-10 LAB — C-REACTIVE PROTEIN: CRP: 1 mg/dL — ABNORMAL HIGH (ref <1.0)

## 2019-07-10 LAB — PROCALCITONIN: Procalcitonin: 0.19 ng/mL

## 2019-07-10 LAB — MAGNESIUM: Magnesium: 2.3 mg/dL (ref 1.7–2.4)

## 2019-07-10 MED ORDER — HALOPERIDOL LACTATE 5 MG/ML IJ SOLN: 2.0000 mg | Freq: Once | INTRAMUSCULAR | Status: DC

## 2019-07-10 MED ORDER — SODIUM BICARBONATE 8.4 % IV SOLN
INTRAVENOUS | Status: AC
  Filled 2019-07-10: qty 150

## 2019-07-10 MED ORDER — SODIUM BICARBONATE 8.4 % IV SOLN
INTRAVENOUS | Status: AC
  Filled 2019-07-10: qty 50

## 2019-07-10 MED ORDER — SODIUM BICARBONATE 8.4 % IV SOLN
50.0000 meq | Freq: Once | INTRAVENOUS | Status: AC
  Administered 2019-07-10: 03:00:00 50 meq via INTRAVENOUS

## 2019-07-10 MED ORDER — SODIUM CHLORIDE 0.9% IV SOLUTION: Freq: Once | INTRAVENOUS | Status: DC

## 2019-07-10 MED ORDER — SODIUM BICARBONATE-DEXTROSE 150-5 MEQ/L-% IV SOLN
150.0000 meq | INTRAVENOUS | Status: DC
  Administered 2019-07-10: 150 meq via INTRAVENOUS
  Filled 2019-07-10 (×14): qty 1000

## 2019-07-10 MED ORDER — NALOXONE HCL 2 MG/2ML IJ SOSY
PREFILLED_SYRINGE | INTRAMUSCULAR | Status: AC
  Administered 2019-07-10: 03:00:00 2 mg
  Filled 2019-07-10: qty 2

## 2019-07-10 MED ORDER — NALOXONE HCL 2 MG/2ML IJ SOSY
PREFILLED_SYRINGE | INTRAMUSCULAR | Status: AC
  Administered 2019-07-10: 2 mg
  Filled 2019-07-10: qty 2

## 2019-07-11 NOTE — Significant Event (Signed)
This is a 74 year old male with medical history significant ofasthma, carotid artery disease, COPD on home oxygen typically 4 LPM, CAD, essential hypertension, hyperlipidemia, CAD, history of NSTEMI, peripheral arterial disease, tobacco use.  Patient has been admitted with acute on chronic hypoxemic respiratory failure with COVID-19 pneumonia.  Rapid response was called around 2 AM as patient was noted to have altered mental status with somnolence, agonal breathing, tachycardia, tachypnea.  Patient had been given Ativan as well as fentanyl for pain and agitation earlier.  On arrival to bedside, patient was noted to have significant respiratory distress with accessory muscle use, tachypnea, equal respirations.  Patient was confirmed to be DNR.  He was also noted to have pinpoint pupils.  Total of Narcan 0.5 mg IV x3 was administered with some improvement mental status.  Patient was noted to be verbalizing and was able to move extremities following administration of Narcan. Arterial blood gas was done which showed significant metabolic acidosis.  Repeat labs showed drop in hemoglobin to 6.2 with significant lactic acidosis that 11.  Patient was given IV fluids and subsequently placed on bicarb drip.  1 unit of packed red blood cells has been ordered.  Has overall very poor prognosis given advanced age, multiple comorbid conditions and COVID-19 pneumonia.  Plan is for conservative treatment.  Will need to discuss with family in a.m. regarding palliative care and possible need for comfort care measures.  Total critical care time spent 70 minutes including dictation time.

## 2019-07-11 NOTE — Progress Notes (Signed)
MD made aware of pt being tachy at 136 bpm and rr 23.

## 2019-07-11 NOTE — Death Summary Note (Addendum)
DEATH SUMMARY   Patient Details  Name: [name] MRN: [id number] DOB: 12/16/1945  Admission/Discharge Information   Admit Date:  07-29-19  Date of Death: Date of Death: 08-03-2019  Time of Death: Time of Death: 0615  Length of Stay: 5  Referring Physician: Kirstie Peri, MD   Reason(s) for Hospitalization  Hypoxia, hypotension  Diagnoses  Preliminary cause of death: Hemorrhagic shock Secondary Diagnoses (including complications and co-morbidities):  Acute on Chronic respiratory Failure with Hypoxia due to COVID-19 pneumonia -normally on 4L [place] at [facility] -increased to 6L but back to 4L in early evening 07/09/19 -continue remdesivir and IV solumedrol -07/09/19--increased sob with increase oxygen demand -07/09/19--early am had rapid response call due to low BP -personally reviewed CXR--stable increased interstitial markings and bilateral opacities -CRP 16.3>>>1.6>>>1.0 -D-Dimer 7.26>>>1.83>>>1.67 -PCT 0.10 -CTA chest--no PE  Hematochezia/Lower GI bleed/Hemorrhagic shock -had 2 episodes hematochezia am 07/09/19 -GI consult-->supportive care. Not a candidate for endoscopic eval -unfortunately, not a good candidate for endoscopy -Hgb noted 6.2 when pt decompensated  Lactic Acidosis -I feel that this is more due to hypoxia at this point than overwhelming sepsis initially -however, significant elevation immediately prior to his death due to hemorrhagic shock  Coronary atherosclerosis of native coronary artery -having intermittent pleuritic chest pain -repeat EKG--sinus tachycardia, unchanged RBBB -cycle troponins 70>>>74>>>66>>>86 -Continue statins -holding ASA due to hematochezia  Mixed hyperlipidemia -CK level WNL -Continue statins   Essential hypertension, benign -BP stable -continue holding diuretics and ACE inhibitor agent in the setting of AKI  AKI (acute kidney injury) on CKD stage 3b (HCC) -baseline creatinine 1.0-1.3 -serum creatinine  peaked 4.12 -continue intermittent IVF's  -Cr down to 1.41 prior to hemorrhagic shock  hypernatremia -in the setting of dehydration  -hold HCTZ  mood disorder/bipolar disease -Resume Remeron, Seroquel and as needed lorazepam. -No suicidal ideation or hallucinations currently.   Goal of Care -had GOC discussion with POA on 12/30 -re-affirmed DNR, no heroic measures, but continue to treat treatable -family does not want transfer to [facility] or [facility]   Brief Hospital Course (including significant findings, care, treatment, and services provided and events leading to death)  [name] is a 74 y.o. year old male who with medical history significant ofasthma, carotid artery disease, COPD on home oxygen typically 4 LPM, CAD, essential hypertension, hyperlipidemia, CAD, history of NSTEMI, peripheral arterial disease, tobacco use who was brought from [facility] due to hypoxia of 71% and hypotension with BP of 71/54 mmHg. Per EMS, he was 100% on his usual 4 LPM. His systolic blood pressures 93 mmHg. He has history of exposure to Covid. He is unable to provide further history at this time.  ED Course:Initial vital signsweretemperature 97.6 F, pulse 99, respiration 21, blood pressure 124/62 mmHg and O2 sat 100% on 4 LPM via nasal cannula. The patient received Solu-Medrol and 1000 mL of NS bolus.  Urinalysis shows small hemoglobinuria and rare bacteria on microscopic examination. His white count is 13.6 g/dL, hemoglobin 78.2 g% and platelets 144. Fibrinogen was 1774 and D-dimer 7.26. BNP was 66 pg/mL. Troponin was 89 and then 61 ng/L. Lactic acid was 2.4 and 1.5 mmol/L. LDH 309, triglycerides 144, procalcitonin 0.73, ferritin 136 and CRP 16.3.Chest radiograph showed increased interstitial markings and scattered bilateral opacities. The patient was started on remdesivir and Solu-Medrol.  Unfortunately, his respiratory status did not significantly improve.  He continued to remain hypoxic  intermittently requiring oxygen up to 6 L.  Sputum cultures grew MRSA.  Although this was  felt to be likely due to colonization, the patient was started on vancomycin empirically given his poor clinical status.  CTA chest was negative for PE.  Goals of care discussion was undertaken with the patient's power of attorney.  She confirmed the patient's DNR status and did not want any heroic measures, but wanted to continue treating the treatable.  She did not want transfer to a higher level of care.  On the morning of 07/09/2019, the patient had 2 episodes of hematochezia with drop in his hemoglobin.  GI was consulted to assist with management.  They do not feel the patient was a good candidate for endoscopic evaluation and recommended supportive care and transfusions as needed.  In the very early morning of 10-24-18, the patient was noted to have agonal respirations and tachycardia.  The patient was seen by Dr. Teodoro KilGadhia.  Labs were ordered, and the patient was given Narcan, sodium bicarbonate, and fluid boluses appropriately.  The patient's family was notified of the patient's declining condition and did not want any further heroic measures.  Ultimately, the patient expired on 10-24-18.    Pertinent Labs and Studies  Significant Diagnostic Studies CT ANGIO CHEST PE W OR WO CONTRAST  Result Date: 07/09/2019 CLINICAL DATA:  Dyspnea.  Elevated D-dimer level. EXAM: CT ANGIOGRAPHY CHEST WITH CONTRAST TECHNIQUE: Multidetector CT imaging of the chest was performed using the standard protocol during bolus administration of intravenous contrast. Multiplanar CT image reconstructions and MIPs were obtained to evaluate the vascular anatomy. CONTRAST:  75mL OMNIPAQUE IOHEXOL 350 MG/ML SOLN COMPARISON:  January 07, 2018. FINDINGS: Cardiovascular: Satisfactory opacification of the pulmonary arteries to the segmental level. No evidence of pulmonary embolism. Normal heart size. No pericardial effusion. Atherosclerosis of  thoracic aorta is noted without aneurysm or dissection. Mild coronary artery calcifications are noted. Mediastinum/Nodes: No enlarged mediastinal, hilar, or axillary lymph nodes. Thyroid gland, trachea, and esophagus demonstrate no significant findings. Lungs/Pleura: No pneumothorax or pleural effusion is noted. Emphysematous disease is noted in the upper lobes bilaterally. Stable bilateral posterior basilar subsegmental atelectasis or scarring is noted. New right upper lobe opacity is noted posteriorly concerning for possible pneumonia. Upper Abdomen: Bilateral nephrolithiasis is noted. Probable small gallstone is noted. Musculoskeletal: No chest wall abnormality. No acute or significant osseous findings. Review of the MIP images confirms the above findings. IMPRESSION: No definite evidence of pulmonary embolus. New reticular opacity is noted in right upper lobe concerning for possible pneumonia. Bilateral nonobstructive nephrolithiasis. Mild coronary calcifications are noted. Probable small gallstone is noted. Aortic Atherosclerosis (ICD10-I70.0) and Emphysema (ICD10-J43.9). Electronically Signed   By: Lupita RaiderJames  Green Jr M.D.   On: 07/09/2019 13:56   DG CHEST PORT 1 VIEW  Result Date: 07/09/2019 CLINICAL DATA:  Shortness of breath. EXAM: PORTABLE CHEST 1 VIEW COMPARISON:  July 08, 2019. FINDINGS: The heart size and mediastinal contours are within normal limits. No pneumothorax or pleural effusion is noted. Stable bibasilar subsegmental atelectasis or infiltrates are noted. Probable emphysematous disease is noted in both upper lobes. Stable right middle lobe opacity is noted concerning for possible infiltrate. The visualized skeletal structures are unremarkable. IMPRESSION: Stable bilateral lung opacities are noted concerning for multifocal pneumonia. Continued radiographic follow-up is recommended to ensure resolution. Aortic Atherosclerosis (ICD10-I70.0). Electronically Signed   By: Lupita RaiderJames  Green Jr M.D.    On: 07/09/2019 09:32   DG CHEST PORT 1 VIEW  Result Date: 07/08/2019 CLINICAL DATA:  Hypoxia EXAM: PORTABLE CHEST 1 VIEW COMPARISON:  July 05, 2019 FINDINGS: There is scattered airspace opacities  bilaterally which are similar to prior study. There is no pneumothorax. No large pleural effusion. The heart size is stable from prior study. Aortic calcifications are noted. IMPRESSION: 1. No interval change in scattered airspace opacities bilaterally compatible with multifocal pneumonia. 2. No pneumothorax or large pleural effusion. Electronically Signed   By: Katherine Mantle M.D.   On: 07/08/2019 22:50   DG Chest Portable 1 View  Result Date: 06/17/2019 CLINICAL DATA:  74 year old with acute onset of shortness of breath. EXAM: PORTABLE CHEST 1 VIEW COMPARISON:  06/14/2019 and earlier. FINDINGS: Suboptimal inspiration accounts for crowded bronchovascular markings diffusely and atelectasis in the bases, and accentuates the cardiac silhouette. Taking this into account, cardiac silhouette upper normal in size to mildly enlarged for AP portable technique, unchanged. Mild pulmonary venous hypertension without overt edema. Lungs otherwise clear. No visible pleural effusions. IMPRESSION: 1. Suboptimal inspiration accounts for bibasilar atelectasis. No acute cardiopulmonary disease otherwise. 2. Stable mild cardiomegaly without pulmonary edema. Electronically Signed   By: Hulan Saas M.D.   On: 06/14/2019 18:55    Microbiology Recent Results (from the past 240 hour(s))  Blood Culture (routine x 2)     Status: None   Collection Time: 07/04/2019  7:55 PM   Specimen: Left Antecubital; Blood  Result Value Ref Range Status   Specimen Description LEFT ANTECUBITAL  Final   Special Requests   Final    BOTTLES DRAWN AEROBIC AND ANAEROBIC Blood Culture adequate volume   Culture   Final    NO GROWTH 5 DAYS Performed at [facility], [address], [place], [place] [address]    Report Status 01-Aug-2019  FINAL  Final  Urine culture     Status: None   Collection Time: 07/03/2019 10:04 PM   Specimen: Urine, Clean Catch  Result Value Ref Range Status   Specimen Description   Final    URINE, CLEAN CATCH Performed at [facility], [address] [place], [place] [address]    Special Requests   Final    NONE Performed at [facility], [address] [place], [place] [address]    Culture   Final    NO GROWTH Performed at [facility], [address], [place], [place] [address]    Report Status 07/07/2019 FINAL  Final  Blood Culture (routine x 2)     Status: None   Collection Time: 06/28/2019 11:09 PM   Specimen: BLOOD RIGHT ARM  Result Value Ref Range Status   Specimen Description BLOOD RIGHT ARM  Final   Special Requests   Final    BOTTLES DRAWN AEROBIC AND ANAEROBIC Blood Culture adequate volume   Culture   Final    NO GROWTH 5 DAYS Performed at [facility], [address], [place], [place] [address]    Report Status 01-Aug-2019 FINAL  Final  Culture, sputum-assessment     Status: None   Collection Time: 07/06/19  8:15 PM   Specimen: Expectorated Sputum  Result Value Ref Range Status   Specimen Description EXPECTORATED SPUTUM  Final   Special Requests Immunocompromised  Final   Sputum evaluation   Final    THIS SPECIMEN IS ACCEPTABLE FOR SPUTUM CULTURE Performed at [facility], [address], [place], [place] [address]    Report Status 07/06/2019 FINAL  Final  Culture, respiratory     Status: None   Collection Time: 07/06/19  8:15 PM  Result Value Ref Range Status   Specimen Description   Final    EXPECTORATED SPUTUM Performed  at [facility], [address], [place], [place] [address]    Special Requests   Final    Immunocompromised Reflexed from [id number] Performed at [address] [place], [place] [address]    Gram Stain   Final    RARE WBC PRESENT, PREDOMINANTLY PMN RARE GRAM POSITIVE COCCI IN PAIRS Performed at [facility], [address] [place], [place] [address]    Culture FEW METHICILLIN RESISTANT STAPHYLOCOCCUS AUREUS  Final   Report Status 07/09/2019 FINAL  Final   Organism ID, Bacteria METHICILLIN RESISTANT STAPHYLOCOCCUS AUREUS  Final      Susceptibility   Methicillin resistant staphylococcus aureus - MIC*    CIPROFLOXACIN >=8 RESISTANT Resistant     ERYTHROMYCIN >=8 RESISTANT Resistant     GENTAMICIN <=0.5 SENSITIVE Sensitive     OXACILLIN >=4 RESISTANT Resistant     TETRACYCLINE <=1 SENSITIVE Sensitive     VANCOMYCIN <=0.5 SENSITIVE Sensitive     TRIMETH/SULFA <=10 SENSITIVE Sensitive     CLINDAMYCIN <=0.25 SENSITIVE Sensitive     RIFAMPIN <=0.5 SENSITIVE Sensitive     Inducible Clindamycin NEGATIVE Sensitive     * FEW METHICILLIN RESISTANT STAPHYLOCOCCUS AUREUS  MRSA PCR Screening     Status: Abnormal   Collection Time: 07/06/19 11:30 PM   Specimen: Nasopharyngeal  Result Value Ref Range Status   MRSA by PCR POSITIVE (A) NEGATIVE Final    Comment:        The GeneXpert MRSA Assay (FDA approved for NASAL specimens only), is one component of a comprehensive MRSA colonization surveillance program. It is not intended to diagnose MRSA infection nor to guide or monitor treatment for MRSA infections. RESULT CALLED TO, READ BACK BY AND VERIFIED WITH: BONDURANT,R. AT 0122 ON 07/07/2019 BY EVA Performed at [facility], [address], [place], [place] [address]   Culture, blood (routine x 2)     Status: None (Preliminary result)   Collection Time: 07/09/19 11:59 AM   Specimen: BLOOD LEFT HAND  Result Value Ref Range Status   Specimen Description BLOOD LEFT HAND  Final   Special Requests   Final    Blood Culture results may not be optimal due to an inadequate volume of blood received in culture bottles   Culture   Final    NO GROWTH < 24 HOURS Performed at [facility], [address], [place], [place] [address]    Report Status PENDING  Incomplete  Culture, blood (routine x 2)     Status: None  (Preliminary result)   Collection Time: 07/09/19 11:59 AM   Specimen: BLOOD  Result Value Ref Range Status   Specimen Description BLOOD LEFT ANTECUBITAL  Final   Special Requests   Final    Blood Culture results may not be optimal due to an inadequate volume of blood received in culture bottles   Culture   Final    NO GROWTH < 24 HOURS Performed at [facility], [address] [place], [place] [address]    Report Status PENDING  Incomplete    Lab Basic Metabolic Panel: Recent Labs  Lab 07/06/19 0548 07/07/19 0631 07/08/19 0528 07/08/19 2220 07/08/19 2250 07/09/19 0714 12-Jul-2019 0234  NA 146* 145 146* 142  --  146* 145  147*  K 4.3 3.8 4.2 3.7  --  4.2 5.1  4.8  CL 114* 112* 113* 112*  --  117* 119*  120*  CO2 18* 19* 19* 17*  --  18* 6*  8*  GLUCOSE 188* 166* 148* 173*  --  173* 259*  207*  BUN 58* 54* 59* 68*  --  71* 88*  85*  CREATININE 2.94* 1.61* 1.41* 1.39*  --  1.33* 2.11*  2.03*  CALCIUM 7.9* 8.2* 8.1* 7.9*  --  7.7* 7.1*  7.0*  MG 1.9 1.9 1.9  --  1.9  --  2.3  PHOS 6.3* 3.9 3.8  --   --  3.1 8.8*   Liver Function Tests: Recent Labs  Lab 07/07/19 0631 07/08/19 0528 07/08/19 2220 07/09/19 0714 08/02/2019 0234  AST 45* 56* 51* 55* 44*  46*  ALT 38 48* 49* 51* 48*  50*  ALKPHOS 29* 30* 27* 23* 25*  25*  BILITOT 0.5 0.6 0.9 0.6 0.8  0.8  PROT 5.8* 6.3* 5.8* 5.1* 4.2*  3.9*  ALBUMIN 2.5* 2.7* 2.5* 2.2* 1.8*  1.7*   No results for input(s): LIPASE, AMYLASE in the last 168 hours. No results for input(s): AMMONIA in the last 168 hours. CBC: Recent Labs  Lab 06/29/2019 1926 07/06/19 0548 07/08/19 2220 07/09/19 0912 08-02-2019 0234  WBC 13.6* 10.5 12.3* 12.6* 31.9*  NEUTROABS 11.7* 9.2*  --   --   --   HGB 11.5* 11.9* 9.5* 9.9* 6.2*  HCT 37.2* 39.8 30.4* 32.4* 21.3*  MCV 95.1 97.1 93.3 95.6 104.4*  PLT 144* 122* 147* 155 227   Cardiac Enzymes: Recent Labs  Lab 07/06/19 0342  CKTOTAL 188   Sepsis Labs: Recent Labs  Lab 06/11/2019 1926  07/06/19 0548 07/08/19 2220 07/08/19 2250 07/09/19 0059 07/09/19 0714 07/09/19 0912 2019-08-02 0234  PROCALCITON 0.73  --   --  <0.10  --   --  0.10 0.19  WBC 13.6* 10.5 12.3*  --   --   --  12.6* 31.9*  LATICACIDVEN 2.4*  --   --  2.6* 2.8* 2.4*  --  >11.0*    Procedures/Operations     Kandee Escalante Aug 02, 2019, 6:00 PM

## 2019-07-11 NOTE — Progress Notes (Signed)
Rapid response called r/t agonal breathing with rr 28 and tachycardia 136 bpm, bp 150/75 and O2 86% on 4L O2 via n/c. Dr. Scherrie November and respiratory at bedside. Pt placed on high flow O2 at 15L, IV Narcan 3.5 total administered. IVF given and labs obtained.  New order for Sodium Bicarbonate 50 mEq IV push x1 then Sodium Bicarbonate 150 mEq in dextrose 5% 1000 ml infusion continuous. BP decreased to 92/76. Pt is confused and verbal with speech unclear at times.

## 2019-07-11 NOTE — Progress Notes (Signed)
CRITICAL VALUE ALERT  Critical Value:  Hemoglobin 6.2, Lactic Acid greater than 11.0 and CO2 6  Date & Time Notied:  07-15-19 at 0400  Provider Notified: Dr. Scherrie November  Orders Received/Actions taken: New orders on [facility]

## 2019-07-11 NOTE — Progress Notes (Signed)
Granddaughter POA Summer Gould contacted in regards to Patient's death. Unsure of funeral home at this time. Belonging packed. Body prepared and bagged. [place] contacted, not a candidate. Bed placement notified. To be picked up form [facility].

## 2019-07-11 DEATH — deceased

## 2019-07-14 LAB — CULTURE, BLOOD (ROUTINE X 2)
Culture: NO GROWTH
Culture: NO GROWTH
